# Patient Record
Sex: Male | Born: 1980 | Race: Black or African American | Hispanic: No | Marital: Single | Smoking: Current every day smoker
Health system: Southern US, Community
[De-identification: ages and names within clinical notes are randomized; demographics above are authoritative.]

## PROBLEM LIST (undated history)

## (undated) DIAGNOSIS — F209 Schizophrenia, unspecified: Secondary | ICD-10-CM

---

## 2010-11-26 ENCOUNTER — Inpatient Hospital Stay: Payer: Self-pay | Admitting: Psychiatry

## 2013-09-30 ENCOUNTER — Inpatient Hospital Stay: Payer: Self-pay | Admitting: Psychiatry

## 2013-09-30 LAB — URINALYSIS, COMPLETE
BACTERIA: NONE SEEN
BILIRUBIN, UR: NEGATIVE
Blood: NEGATIVE
Glucose,UR: NEGATIVE mg/dL (ref 0–75)
Ketone: NEGATIVE
LEUKOCYTE ESTERASE: NEGATIVE
NITRITE: NEGATIVE
Ph: 6 (ref 4.5–8.0)
Protein: NEGATIVE
RBC,UR: NONE SEEN /HPF (ref 0–5)
SQUAMOUS EPITHELIAL: NONE SEEN
Specific Gravity: 1.006 (ref 1.003–1.030)
WBC UR: NONE SEEN /HPF (ref 0–5)

## 2013-09-30 LAB — COMPREHENSIVE METABOLIC PANEL
ALBUMIN: 3.4 g/dL (ref 3.4–5.0)
AST: 21 U/L (ref 15–37)
Alkaline Phosphatase: 111 U/L
Anion Gap: 6 — ABNORMAL LOW (ref 7–16)
BUN: 8 mg/dL (ref 7–18)
Bilirubin,Total: 0.3 mg/dL (ref 0.2–1.0)
CREATININE: 0.97 mg/dL (ref 0.60–1.30)
Calcium, Total: 8.6 mg/dL (ref 8.5–10.1)
Chloride: 109 mmol/L — ABNORMAL HIGH (ref 98–107)
Co2: 23 mmol/L (ref 21–32)
EGFR (Non-African Amer.): >60
Glucose: 83 mg/dL (ref 65–99)
Osmolality: 273 (ref 275–301)
Potassium: 3.7 mmol/L (ref 3.5–5.1)
SGPT (ALT): 19 U/L
SODIUM: 138 mmol/L (ref 136–145)
Total Protein: 8 g/dL (ref 6.4–8.2)

## 2013-09-30 LAB — DRUG SCREEN, URINE
AMPHETAMINES, UR SCREEN: NEGATIVE (ref ?–1000)
BENZODIAZEPINE, UR SCRN: NEGATIVE (ref ?–200)
Barbiturates, Ur Screen: NEGATIVE (ref ?–200)
CANNABINOID 50 NG, UR ~~LOC~~: NEGATIVE (ref ?–50)
COCAINE METABOLITE, UR ~~LOC~~: NEGATIVE (ref ?–300)
MDMA (Ecstasy)Ur Screen: NEGATIVE (ref ?–500)
METHADONE, UR SCREEN: NEGATIVE (ref ?–300)
OPIATE, UR SCREEN: NEGATIVE (ref ?–300)
Phencyclidine (PCP) Ur S: NEGATIVE (ref ?–25)
TRICYCLIC, UR SCREEN: NEGATIVE (ref ?–1000)

## 2013-09-30 LAB — ETHANOL: Ethanol: <3 mg/dL

## 2013-09-30 LAB — ACETAMINOPHEN LEVEL: Acetaminophen: <2 ug/mL

## 2013-09-30 LAB — SALICYLATE LEVEL: SALICYLATES, SERUM: 2.8 mg/dL

## 2013-09-30 LAB — CBC
HCT: 42.5 % (ref 40.0–52.0)
HGB: 14 g/dL (ref 13.0–18.0)
MCH: 29.8 pg (ref 26.0–34.0)
MCHC: 33 g/dL (ref 32.0–36.0)
MCV: 90 fL (ref 80–100)
Platelet: 280 10*3/uL (ref 150–440)
RBC: 4.7 10*6/uL (ref 4.40–5.90)
RDW: 13.7 % (ref 11.5–14.5)
WBC: 5.7 10*3/uL (ref 3.8–10.6)

## 2014-05-21 NOTE — H&P (Signed)
PATIENT NAME:  Shane Nash, Shane Nash MR#:  [id number] DATE OF BIRTH:  June 30, 1980  DATE OF ADMISSION:  09/30/2013  REFERRING PHYSICIAN: Emergency Room doctor.   ATTENDING PHYSICIAN: Dr. Kristine Linea.   IDENTIFYING DATA: [name] is a 34 year old male with history of schizophrenia.   CHIEF COMPLAINT: "I want to know how much money I should be getting."   HISTORY OF PRESENT ILLNESS: [name] has a long history of mental illness. He has been stable on a combination of Zyprexa and Luvox. He had very few hospitalizations lately 10 years ago and in 2012 at [facility] His ACT team noticed that the patient behaves strangely. He was not as easy to engage, expressed some bizarre behaviors and lately started complaining of blood coming out of eye. This actuality is one of his main complaints. He was brought to the Emergency Room for evaluation. He was admitted to psychiatric floor. The patient is really unable to describe his symptoms. He denies symptoms of depression. He does not feel too anxious. He does have auditory hallucinations, but lately feels that his right ear is clogged with wax. It helps hallucinations. He is a slightly paranoid but mostly puts forward somatic symptoms of earache and eye ache and bleeding. He says that his eye is better now. It certainly looks normal to me, but he keeps touching it telling me that there used to be a burn. He denies symptoms suggestive of bipolar mania. There is no alcohol or substances involved. Of note, the patient has a history of treatment noncompliance when visiting his big family. Reportedly he spent several days with his family recently. It is quite possible that he stopped taking medications. In addition, in the past he had access to substances, mostly marijuana, when visiting his family. He is negative for substances on urine tox screen. He is unable to explain to me where he lives. He used to live in [facility] home, but  this is not true any longer. He does not know if he is in a group home or if he has an independent apartment. He says you can call it this way. He cannot tell me whether he buys his food or people feed him. He is pretty low functioning. He is concerned that his sister, who is his payee, has been withholding money from him. It all depends on his living situation. We certainly will try to give her a call. [facility] team is well aware of his situation I hope.   PAST PSYCHIATRIC HISTORY: A long history of mental illness, stable on Zyprexa. He was diagnosed in his 67s. He does not have a history of violence or suicide attempt. There is some mild substance use, but not a problem at throughout his life.   FAMILY PSYCHIATRIC HISTORY: None reported.   PAST MEDICAL HISTORY: None.   ALLERGIES: No known drug allergies.   MEDICATIONS ON ADMISSION: Zyprexa 15 mg daily, Luvox 50 mg twice daily   SOCIAL HISTORY: As above, he most likely lives in a group home. He is disabled. He has a payee. He is in the care of Dr. Cherylann Ratel and [facility] ACT team.  REVIEW OF SYSTEMS:  CONSTITUTIONAL: No fevers or chills. No weight changes.  EYES: Positive for some eye burning.  ENT: No hearing loss, but complains of wax in his ear.  RESPIRATORY: No shortness of breath or cough.  CARDIOVASCULAR: No chest pain or orthopnea.  GASTROINTESTINAL: No abdominal pain, nausea, vomiting, or diarrhea.  GENITOURINARY: No incontinence  or frequency.  ENDOCRINE: No heat or cold intolerance.  LYMPHATIC: No anemia or easy bruising.  INTEGUMENTARY: No acne or rash.  MUSCULOSKELETAL: No muscle or joint pain.  NEUROLOGIC: No tingling or weakness.  PSYCHIATRIC: See history of present illness for details.   PHYSICAL EXAMINATION:  VITAL SIGNS: Blood pressure 114/76, pulse 74, respiration 98.9.  GENERAL: This is a well-developed young male in no acute distress.  HEENT: The pupils are equal, round, and reactive to light. Sclerae are anicteric.  NECK:  Supple. No thyromegaly.  LUNGS: Clear to auscultation. No dullness to percussion.  HEART: Regular rhythm and rate. No murmurs, rubs, or gallops.  ABDOMEN: Soft, nontender, nondistended. Positive bowel sounds.  MUSCULOSKELETAL: Normal muscle strength in all extremities.  SKIN: No rashes or bruises.  LYMPHATIC: No cervical adenopathy.  NEUROLOGIC: Cranial nerves II through XII are intact.   LABORATORY DATA: Chemistries are within normal limits. Blood alcohol level is zero. LFTs within normal limits. Urine tox screen negative for substances. CBC within normal limits. Urinalysis is not suggestive of urinary tract infection. Serum acetaminophen and salicylates are low.   MENTAL STATUS EXAMINATION ON ADMISSION: The patient is alert and oriented to person, place, and somewhat to situation. He is pleasant, polite, and cooperative. His is marginally groomed, wearing hospital scrubs. He maintains good eye contact. His speech is slightly slurred, mood is fine with an odd affect. Thought process is slow. He denies thoughts of hurting himself or others. He is slightly paranoid about his money. He endorses hallucinations but is unable to tell me about their content. His cognition is impaired. There is a long latency of response, poverty of thought and speech. He is unable to participate in cognitive part of the examination. He is of below-average intelligence and fund of knowledge. His insight and judgment are poor.   SUICIDE RISK ASSESSMENT ON ADMISSION: This is a patient with a long history of psychosis, admitted for another psychotic break, most likely in the context of medication noncompliance.   INITIAL DIAGNOSES:  AXIS II: Schizophrenia, obsessive-compulsive disorder.   AXIS II: Borderline intellectual functioning.   AXIS III: None.   AXIS IV: Mental illness, treatment compliance.   AXIS V: Global assessment of functioning 35.   PLAN: The patient was admitted to [facility]  Behavioral Medicine Unit for safety, stabilization, and medication management. He was initially placed on suicide precautions and was closely monitored for any unsafe behaviors. He underwent full psychiatric and risk assessment. He received pharmacotherapy, individual and group psychotherapy, substance abuse counseling, and support from therapeutic milieu.  1. Psychosis: We will continue Zyprexa 30 mg at night. 2. Anxiety. We will continue Luvox 50 mg twice daily.   DISPOSITION: He will return to his group home and follow up with the ACT team.    ____________________________ Ellin GoodieJolanta B. Jennet MaduroPucilowska, MD jbp:lt D: 10/01/2013 16:33:00 ET T: 10/01/2013 16:55:45 ET JOB#: [id number]  cc: Charlene Detter B. Jennet MaduroPucilowska, MD, <Dictator> Shari ProwsJOLANTA B Amie Cowens MD ELECTRONICALLY SIGNED 10/25/2013 23:13 Shari ProwsJOLANTA B Cosimo Schertzer MD ELECTRONICALLY SIGNED 10/25/2013 23:22

## 2014-05-21 NOTE — Consult Note (Signed)
Brief Consult Note: Diagnosis: schizophrenia.   Patient was seen by consultant.   Consult note dictated.   Recommend further assessment or treatment.   Orders entered.   Discussed with Attending MD.   Comments: PSychiatry: PAtient seen and chart reviewed. Note dictated. PAtient with schizophrenia has been decompensating at hhome. Admit for stabilization and concern of dangerousness.  Electronic Signatures: Audery Amellapacs, [name] (MD)  (Signed 03-Sep-15 17:09)  Authored: Brief Consult Note   Last Updated: 03-Sep-15 17:09 by Audery Amellapacs, [name] (MD)

## 2014-05-21 NOTE — Consult Note (Signed)
PATIENT NAME:  Shane Nash, Shane L MR#:  [id number] DATE OF BIRTH:  01-Nov-1980  DATE OF CONSULTATION:  09/30/2013  CONSULTING PHYSICIAN:  Audery AmelJohn T. Orva Riles, MD  IDENTIFYING INFORMATION AND REASON FOR CONSULTATION: This is a 34 year old male with a history of schizophrenia, brought by his acting.   CHIEF COMPLAINT: "I'm having some trouble with my eye."   HISTORY OF PRESENT ILLNESS: Information obtained from the patient and the chart. [facility] was also present, giving history. The patient himself says he is coming here because there is something wrong with his right eye. Apparently, he told someone at some point that there was blood coming out of his eye. He made another comment at some point that he needed blood put into his eye. He made some other comments about there  being something wrong with his blood. The ACT team is concerned because they say that for at least several days, the patient has been decompensating from his usual baseline state. He has been more disorganized and his mood has been more withdrawn. He has not been as easy to engage.  His thoughts have seemed to be bizarre. It is also reported that he had damaged some property by cutting up some mattresses back at the group home. There is no recent change to medication. No clear known stress. The patient says he had recently been to visit his family, which in the past was a known stressor for him, but I have not confirm that. No sign that he has been abusing substances that I can see.   PAST PSYCHIATRIC HISTORY: Long history of schizophrenia, usually followed by Dr. Cherylann RatelLateef and the [facility] ACT team. He has been on Zyprexa and is currently on Luvox as well. We also have some paperwork suggesting that he has been given [place]ve[place] Sustenna, although I am not sure if that is meant to replace the Zyprexa or not. No known history of suicidality or violence. It has been years since he had an inpatient hospitalization, the last one  was 3 years ago here at our hospital.  Prior to that, it has been many years since he had had a hospital stay.   FAMILY HISTORY: No known or identified clear family history.   SOCIAL HISTORY: The patient evidently has a very large and involved family, several siblings, who see him and who he visits at times. Lives in a group home. Seems like generally he has thought of as being a very stable, easy to work with patient.   PAST MEDICAL HISTORY: No known medical problems.   SUBSTANCE ABUSE HISTORY: Has a history of use of marijuana, which has caused decompensations, but he denies that he has been using any recently.   CURRENT MEDICATIONS: From the information I have, it looks like it is Zyprexa 15 mg 2 of them at night, so 30 mg at night. Also, Luvox 50 mg twice a day. Possibly an [place] Sust[place] shot as well.   ALLERGIES: No known drug allergies.   REVIEW OF SYSTEMS: The patient says his eye is feeling better now. He was burning earlier, but now it feels fine. He denies any GI, cardiac or pulmonary complaints. He says that his mood is okay. He denies that he is having hallucinations. Denies suicidal or homicidal ideation. Generally negative review of systems all around.   MENTAL STATUS EXAMINATION: Disheveled gentleman, looks poorly groomed. Very passive. Minimal eye contact. Psychomotor activity very limited. Has some odd posturing with his hands; I  am not sure if that is tardive dyskinesia or what. His speech is very quiet and minimal in amount. Affect is flat and without expression, mood stated as okay. Thoughts very simple and slow. Limited elaboration. Concrete. Denies suicidal or homicidal ideation. Denies that he is hearing voices or seeing things. He was alert and oriented to his situation and to being in the hospital. He was able to recall 3/3 objects immediately, 2/3 at 3 minutes. Unclear about how good his baseline fund of knowledge is.   LABORATORY RESULTS: The chemistry panel is  essentially normal, only a very slight abnormality to chloride. The drug screen is all negative. The CBC is normal. Urinalysis is normal.  That is significant because it had been reported that he was urinating in inappropriate places and there was a question as to whether that could be because of an infection, but it does not look like that is the case.   VITAL SIGNS: Blood pressure 139/80, pulse 82, temperature 97.6, respirations 20.   ASSESSMENT: This is a 34 year old man with schizophrenia, who recently has been decompensating with more withdrawn mental state, less communication, more odd behavior, some disorganized behavior, such as inappropriate urination and possible dangerousness from cutting up items at home. No clear stressor. The patient has a history of psychosis that has needed stabilization intermittently. I was driven to make the decision to go ahead and admit him by the history of him cutting things up at home and concern that that could be dangerousness.   TREATMENT PLAN: Admit to psychiatry. Continue the Zyprexa and Luvox. Precautions in place. Primary team downstairs can work on evaluation and further treatment planning.   DIAGNOSIS, PRINCIPAL AND PRIMARY:   AXIS I: Schizophrenia.   SECONDARY DIAGNOSES: AXIS I: No further.  AXIS II: No diagnosis.  AXIS III: No diagnosis.   ____________________________ Audery Amel, MD jtc:lr D: 09/30/2013 17:17:29 ET T: 09/30/2013 18:43:05 ET JOB#: [id number]  cc: Audery Amel, MD, <Dictator> Audery Amel MD ELECTRONICALLY SIGNED 10/08/2013 17:01

## 2017-01-27 ENCOUNTER — Emergency Department
Admission: EM | Admit: 2017-01-27 | Discharge: 2017-01-30 | Disposition: A | Discharge status: Other Acute Inpatient Hospital | Payer: Medicaid Other | Attending: Student in an Organized Health Care Education/Training Program | Admitting: Student in an Organized Health Care Education/Training Program

## 2017-01-27 ENCOUNTER — Encounter: Payer: Self-pay | Admitting: Emergency Medicine

## 2017-01-27 ENCOUNTER — Other Ambulatory Visit: Payer: Self-pay

## 2017-01-27 DIAGNOSIS — F201 Disorganized schizophrenia: Secondary | ICD-10-CM

## 2017-01-27 DIAGNOSIS — Z046 Encounter for general psychiatric examination, requested by authority: Secondary | ICD-10-CM | POA: Diagnosis present

## 2017-01-27 DIAGNOSIS — F17200 Nicotine dependence, unspecified, uncomplicated: Secondary | ICD-10-CM | POA: Insufficient documentation

## 2017-01-27 DIAGNOSIS — F54 Psychological and behavioral factors associated with disorders or diseases classified elsewhere: Secondary | ICD-10-CM | POA: Diagnosis not present

## 2017-01-27 DIAGNOSIS — E871 Hypo-osmolality and hyponatremia: Secondary | ICD-10-CM | POA: Diagnosis not present

## 2017-01-27 DIAGNOSIS — R631 Polydipsia: Secondary | ICD-10-CM

## 2017-01-27 DIAGNOSIS — F259 Schizoaffective disorder, unspecified: Secondary | ICD-10-CM | POA: Insufficient documentation

## 2017-01-27 DIAGNOSIS — F209 Schizophrenia, unspecified: Secondary | ICD-10-CM

## 2017-01-27 HISTORY — DX: Schizophrenia, unspecified: F20.9

## 2017-01-27 LAB — COMPREHENSIVE METABOLIC PANEL
ALBUMIN: 3.5 g/dL (ref 3.5–5.0)
ALK PHOS: 95 U/L (ref 38–126)
ALT: 19 U/L (ref 17–63)
ALT: 18 U/L (ref 17–63)
ANION GAP: 8 (ref 5–15)
AST: 46 U/L — AB (ref 15–41)
AST: 54 U/L — ABNORMAL HIGH (ref 15–41)
Albumin: 3.6 g/dL (ref 3.5–5.0)
Alkaline Phosphatase: 103 U/L (ref 38–126)
Anion gap: 10 (ref 5–15)
BUN: <5 mg/dL — ABNORMAL LOW (ref 6–20)
CALCIUM: 8.5 mg/dL — AB (ref 8.9–10.3)
CHLORIDE: 90 mmol/L — AB (ref 101–111)
CHLORIDE: 104 mmol/L (ref 101–111)
CO2: 21 mmol/L — AB (ref 22–32)
CO2: 25 mmol/L (ref 22–32)
CREATININE: 0.82 mg/dL (ref 0.61–1.24)
Calcium: 8.6 mg/dL — ABNORMAL LOW (ref 8.9–10.3)
Creatinine, Ser: 0.89 mg/dL (ref 0.61–1.24)
GFR calc Af Amer: >60 mL/min (ref >60)
GFR calc Af Amer: >60 mL/min (ref >60)
GFR calc non Af Amer: >60 mL/min (ref >60)
GLUCOSE: 106 mg/dL — AB (ref 65–99)
Glucose, Bld: 92 mg/dL (ref 65–99)
POTASSIUM: 3.2 mmol/L — AB (ref 3.5–5.1)
Potassium: 3.7 mmol/L (ref 3.5–5.1)
SODIUM: 121 mmol/L — AB (ref 135–145)
SODIUM: 137 mmol/L (ref 135–145)
Total Bilirubin: 1.1 mg/dL (ref 0.3–1.2)
Total Bilirubin: 1 mg/dL (ref 0.3–1.2)
Total Protein: 7.8 g/dL (ref 6.5–8.1)
Total Protein: 7.4 g/dL (ref 6.5–8.1)

## 2017-01-27 LAB — ACETAMINOPHEN LEVEL: Acetaminophen (Tylenol), Serum: <10 ug/mL — ABNORMAL LOW (ref 10–30)

## 2017-01-27 LAB — CBC
HCT: 39.2 % — ABNORMAL LOW (ref 40.0–52.0)
HEMOGLOBIN: 13.4 g/dL (ref 13.0–18.0)
MCH: 29.8 pg (ref 26.0–34.0)
MCHC: 34.1 g/dL (ref 32.0–36.0)
MCV: 87.4 fL (ref 80.0–100.0)
PLATELETS: 369 10*3/uL (ref 150–440)
RBC: 4.49 MIL/uL (ref 4.40–5.90)
RDW: 12.6 % (ref 11.5–14.5)
WBC: 12.4 10*3/uL — AB (ref 3.8–10.6)

## 2017-01-27 LAB — ETHANOL

## 2017-01-27 LAB — SALICYLATE LEVEL

## 2017-01-27 MED ORDER — SODIUM CHLORIDE 0.9 % IV BOLUS (SEPSIS)
1000.0000 mL | Freq: Once | INTRAVENOUS | Status: AC
  Administered 2017-01-27: 1000 mL via INTRAVENOUS

## 2017-01-27 MED ORDER — OLANZAPINE 5 MG PO TABS
15.0000 mg | ORAL_TABLET | Freq: Once | ORAL | Status: AC
  Administered 2017-01-27: 15 mg via ORAL
  Filled 2017-01-27: qty 1

## 2017-01-27 MED ORDER — MIDAZOLAM HCL 5 MG/5ML IJ SOLN
5.0000 mg | Freq: Once | INTRAMUSCULAR | Status: AC
  Administered 2017-01-27: 5 mg via INTRAMUSCULAR

## 2017-01-27 MED ORDER — OLANZAPINE 5 MG PO TABS
15.0000 mg | ORAL_TABLET | Freq: Every day | ORAL | Status: DC
  Administered 2017-01-28 – 2017-01-29 (×2): 15 mg via ORAL
  Filled 2017-01-27 (×2): qty 1

## 2017-01-27 MED ORDER — HALOPERIDOL LACTATE 5 MG/ML IJ SOLN
INTRAMUSCULAR | Status: AC
  Administered 2017-01-27: 10 mg via INTRAMUSCULAR
  Filled 2017-01-27: qty 1

## 2017-01-27 MED ORDER — SODIUM CHLORIDE 1 G PO TABS
1.0000 g | ORAL_TABLET | Freq: Three times a day (TID) | ORAL | Status: DC
  Administered 2017-01-27 – 2017-01-30 (×5): 1 g via ORAL
  Filled 2017-01-27 (×11): qty 1

## 2017-01-27 MED ORDER — HALOPERIDOL LACTATE 5 MG/ML IJ SOLN
10.0000 mg | Freq: Once | INTRAMUSCULAR | Status: AC
  Administered 2017-01-27: 10 mg via INTRAMUSCULAR

## 2017-01-27 MED ORDER — OLANZAPINE 5 MG PO TABS
ORAL_TABLET | ORAL | Status: AC
  Filled 2017-01-27: qty 1

## 2017-01-27 MED ORDER — SODIUM CHLORIDE 1 G PO TABS
1.0000 g | ORAL_TABLET | Freq: Once | ORAL | Status: DC
  Filled 2017-01-27: qty 1

## 2017-01-27 MED ORDER — MIDAZOLAM HCL 5 MG/5ML IJ SOLN
INTRAMUSCULAR | Status: AC
  Administered 2017-01-27: 5 mg via INTRAMUSCULAR
  Filled 2017-01-27: qty 5

## 2017-01-27 NOTE — ED Notes (Signed)
Pt awake but lying in bed at this time. Pt is calm and no longer drinking from sink. Pt continues to be ambulatory to bathroom independently.

## 2017-01-27 NOTE — ED Notes (Signed)
PT continues to refuse medications. RN has mixed medication in with a little water and pt drank water and pills together. Pt remains awake at this time and has started pacing the room again. RN has instructed pt back into bed.

## 2017-01-27 NOTE — BH Assessment (Signed)
This Clinical research associatewriter [facility], however patient is sedated.

## 2017-01-27 NOTE — ED Notes (Signed)
Pt lying in bed at this time sleeping. NAD. Pt hooked to monitor. Door closed since garage door in treatment room is open. Blinds open and lights on for continued observation.

## 2017-01-27 NOTE — ED Notes (Signed)
TTS was unable to complete assessment at this time.  Shane Nash was disoriented and was not able to provide additional information.  He arrived to the ED under IVC with the reports of being off his medication for approximately 3 weeks, no sleep for 1 week, and a prior diagnosis of schizophrenia.

## 2017-01-27 NOTE — ED Notes (Signed)
Pt sitting in sub wait with BPD.

## 2017-01-27 NOTE — ED Notes (Signed)
Pt repeatedly attempting to drink out of sink, pt holding his fingers up and talking to his fingers stating "my family needs some water". Pt is noted to be unable to sit still at this time.

## 2017-01-27 NOTE — ED Notes (Signed)
Pt walking around in room and has pulled IV out of arm.  Site is bleeding at this time. Bed and floor are covered in urine. Pt reports he has to use the restroom. Another pt is in the restroom and pt became flustered and dropped pants and started urinating on the floor. Floor is now covered in urine. Pt has been taken into showers. Cloths changes and pt showered. Sheets changes and EVS called to clean the floors. Pt appears to be in NAD at this time and is back in bed resting. No new IV established.

## 2017-01-27 NOTE — Consult Note (Signed)
[facility] Consult   Reason for Consult: Consult for 36 year old man with schizophrenia brought in by law enforcement with reports of bizarre behavior Referring Physician: Quentin Cornwall Patient Identification: TIMUR NIBERT MRN:  [id number] Principal Diagnosis: Schizophrenia [facility]) Diagnosis:   Patient Active Problem List   Diagnosis Date Noted  . Schizophrenia ([place]) [F20.9] 01/27/2017  . Hyponatremia [E87.1] 01/27/2017  . Polydipsia [R63.1] 01/27/2017    Total Time spent with patient: 1 hour  Subjective:   REACE BRESHEARS is a 36 y.o. male patient admitted with "I took my medicine".  HPI: Patient interviewed chart reviewed.  This is a 36 year old man with a known history of schizophrenia brought to the hospital with commitment papers that report that the patient was acting bizarrely appeared to have urinated on himself was unable to engage in coherent conversation.  I attempted to interview the patient.  Found him in his room in the emergency room with his scrub clothes already in [place].  Patient was pacing back and forth waving his arms around but was not aggressive or threatening.  I tried to engage him in conversation but nothing he said really made any sense.  He did not again appear to be hostile responding to me and appeared to react to questions with something that sounded like it could be an appropriate piece of speech but it was all garbled and it was difficult to understand.  He did appear to be saying that he had taken his medicine although he did not know what the medicine was and it was not clear to me whether he was referring to his outpatient medicine or to the shots that he had been given.  Patient denied that he had been using any drugs.  Social history: Patient has a known history of schizophrenia.  Not really clear at this point where he is staying.  It sounds like that law enforcement found him somewhere out in public.  Last time we interacted with this  gentleman he was living in a group home and had an act team  Medical history: Does not have any known ongoing medical problems besides his schizophrenia.  On presentation today his sodium is 121 with other chemistries abnormal as well.  This seems to clearly be related to polydipsia.  The patient has been attempting to drink water from the sink in large quantities just since being in the emergency room.  Substance abuse history: When we have worked with this patient before it was noted that he had a history of getting into marijuana occasionally when he was unsupervised and that that contributed to some decompensation  Past Psychiatric History: No known past psychiatric history.  One prior admission that we know of here which was a few years ago.  At that time was being managed on oral Zyprexa as his primary medicine.  It was noted that he had a history of intermittent noncompliance.  Particularly I see from the old notes that he has a history of becoming noncompliant when he goes to visit his family.  This makes me think there is a good chance that he had a family visit over Christmas which might of contributed to this decompensation but I do not have any direct proof of that.  No known history of suicide attempts.  No known history of violence  Risk to Self: Is patient at risk for suicide?: No, but patient needs Medical Clearance Risk to Others:   Prior Inpatient Therapy:   Prior Outpatient Therapy:  Past Medical History:  Past Medical History:  Diagnosis Date  . Schizophrenia ([place])    History reviewed. No pertinent surgical history. Family History: History reviewed. No pertinent family history. Family Psychiatric  History: None known Social History:  Social History   Substance and Sexual Activity  Alcohol Use Not on file     Social History   Substance and Sexual Activity  Drug Use No    Social History   Socioeconomic History  . Marital status: Single    Spouse name: None  .  Number of children: None  . Years of education: None  . Highest education level: None  Social Needs  . Financial resource strain: None  . Food insecurity - worry: None  . Food insecurity - inability: None  . Transportation needs - medical: None  . Transportation needs - non-medical: None  Occupational History  . None  Tobacco Use  . Smoking status: Current Every Day Smoker  . Smokeless tobacco: Never Used  Substance and Sexual Activity  . Alcohol use: None  . Drug use: No  . Sexual activity: None  Other Topics Concern  . None  Social History Narrative  . None   Additional Social History:    Allergies:  No Known Allergies  Labs:  Results for orders placed or performed during the hospital encounter of 01/27/17 (from the past 48 hour(s))  Comprehensive metabolic panel     Status: Abnormal   Collection Time: 01/27/17 12:47 PM  Result Value Ref Range   Sodium 121 (L) 135 - 145 mmol/L   Potassium 3.2 (L) 3.5 - 5.1 mmol/L   Chloride 90 (L) 101 - 111 mmol/L   CO2 21 (L) 22 - 32 mmol/L   Glucose, Bld 92 65 - 99 mg/dL   BUN <5 (L) 6 - 20 mg/dL   Creatinine, Ser 0.82 0.61 - 1.24 mg/dL   Calcium 8.6 (L) 8.9 - 10.3 mg/dL   Total Protein 7.8 6.5 - 8.1 g/dL   Albumin 3.6 3.5 - 5.0 g/dL   AST 46 (H) 15 - 41 U/L   ALT 19 17 - 63 U/L   Alkaline Phosphatase 103 38 - 126 U/L   Total Bilirubin 1.1 0.3 - 1.2 mg/dL   GFR calc non Af Amer >60 >60 mL/min   GFR calc Af Amer >60 >60 mL/min    Comment: (NOTE) The eGFR has been calculated using the CKD EPI equation. This calculation has not been validated in all clinical situations. eGFR's persistently <60 mL/min signify possible Chronic Kidney Disease.    Anion gap 10 5 - 15    Comment: Performed at [facility], [address] [place], [place] [address]  Ethanol     Status: None   Collection Time: 01/27/17 12:47 PM  Result Value Ref Range   Alcohol, Ethyl (B) <10 <10 mg/dL    Comment:        LOWEST DETECTABLE LIMIT  FOR SERUM ALCOHOL IS 10 mg/dL FOR MEDICAL PURPOSES ONLY Performed at [facility], [address] [place], [place] [address]   Salicylate level     Status: None   Collection Time: 01/27/17 12:47 PM  Result Value Ref Range   Salicylate Lvl <8.5 2.8 - 30.0 mg/dL    Comment: Performed at [facility], [address] [place], [place] [address]  Acetaminophen level     Status: Abnormal   Collection Time: 01/27/17 12:47 PM  Result Value Ref Range   Acetaminophen (Tylenol), Serum <10 (L)  10 - 30 ug/mL    Comment:        THERAPEUTIC CONCENTRATIONS VARY SIGNIFICANTLY. A RANGE OF 10-30 ug/mL MAY BE AN EFFECTIVE CONCENTRATION FOR MANY PATIENTS. HOWEVER, SOME ARE BEST TREATED AT CONCENTRATIONS OUTSIDE THIS RANGE. ACETAMINOPHEN CONCENTRATIONS >150 ug/mL AT 4 HOURS AFTER INGESTION AND >50 ug/mL AT 12 HOURS AFTER INGESTION ARE OFTEN ASSOCIATED WITH TOXIC REACTIONS. Performed at [facility], [address] [place], [place] [address]   cbc     Status: Abnormal   Collection Time: 01/27/17 12:47 PM  Result Value Ref Range   WBC 12.4 (H) 3.8 - 10.6 K/uL   RBC 4.49 4.40 - 5.90 MIL/uL   Hemoglobin 13.4 13.0 - 18.0 g/dL   HCT 39.2 (L) 40.0 - 52.0 %   MCV 87.4 80.0 - 100.0 fL   MCH 29.8 26.0 - 34.0 pg   MCHC 34.1 32.0 - 36.0 g/dL   RDW 12.6 11.5 - 14.5 %   Platelets 369 150 - 440 K/uL    Comment: Performed at [facility], [address] [place], [place] [address]    Current Facility-Administered Medications  Medication Dose Route Frequency Provider Last Rate Last Dose  . OLANZapine (ZYPREXA) tablet 15 mg  15 mg Oral Once Skylar Priest T, MD      . sodium chloride tablet 1 g  1 g Oral TID WC Merlyn Lot, MD       No current outpatient medications on file.    Musculoskeletal: Strength & Muscle Tone: within normal limits Gait & Station: normal Patient leans: N/A  Psychiatric Specialty Exam: Physical Exam  Nursing note and vitals  reviewed. Constitutional: He appears well-developed and well-nourished.  HENT:  Head: Normocephalic and atraumatic.  Eyes: Conjunctivae are normal. Pupils are equal, round, and reactive to light.  Neck: Normal range of motion.  Cardiovascular: Regular rhythm and normal heart sounds.  Respiratory: Effort normal. No respiratory distress.  GI: Soft.  Musculoskeletal: Normal range of motion.  Neurological: He is alert.  Skin: Skin is warm and dry.  Psychiatric: His affect is labile and inappropriate. His speech is slurred. He is agitated and hyperactive. He is not aggressive and not combative. Cognition and memory are impaired. He expresses impulsivity. He is noncommunicative.    Review of Systems  Unable to perform ROS: Psychiatric disorder  Psychiatric/Behavioral: Negative for depression.    Height 5' 8.5" (1.74 m), weight 190 lb (86.2 kg).Body mass index is 28.47 kg/m.  General Appearance: Disheveled  Eye Contact:  Fair  Speech:  Garbled and Slurred  Volume:  Decreased  Mood:  Euthymic  Affect:  Bizarre halfway laughing distracted sort of affect  Thought Process:  Disorganized and Irrelevant  Orientation:  Negative  Thought Content:  Illogical, Rumination and Tangential  Suicidal Thoughts:  No  Homicidal Thoughts:  No  Memory:  Immediate;   Fair Recent;   Poor Remote;   Fair  Judgement:  Impaired  Insight:  Shallow  Psychomotor Activity:  Increased and Restlessness  Concentration:  Concentration: Poor  Recall:  Poor  Fund of Knowledge:  Fair  Language:  Fair  Akathisia:  No  Handed:  Right  AIMS (if indicated):     Assets:  Financial Resources/Insurance Physical Health Resilience  ADL's:  Impaired  Cognition:  Impaired,  Mild  Sleep:        Treatment Plan Summary: Daily contact with patient to assess and evaluate symptoms and progress in treatment, Medication management and Plan 36 year old gentleman with schizophrenia who  presents very psychotic disorganized  acting bizarrely.  He is agitated but is not violent or threatening and has been cooperative with medicine.  We do not have a drug screen back yet but given his past history the most likely thing is that he had been noncompliant with his medicine.  I am restarting him on Zyprexa 15 mg at night which was his previous dose.  Because of the hyponatremia he will need to be observed in the emergency room at least overnight while being water restricted to make sure his sodium is improving.  We will probably need to have a sitter with him when he goes downstairs if he is continuing to try to excessively water drink.  Medicine was consulted.  Agreed with water restriction and replacing his potassium.  We will reevaluate when he is medically cleared.  Disposition: Recommend psychiatric Inpatient admission when medically cleared. Supportive therapy provided about ongoing stressors.  Alethia Berthold, MD 01/27/2017 2:47 PM

## 2017-01-27 NOTE — ED Triage Notes (Signed)
Pt presents to ED in custody of [employer] under IVC. Per IVC pt was unresponsive and urinating on himself. IVC states that pt was dx as schizophrenic and has been off of his meds for approx 3 weeks and hasn't slept for approx 1 week. Pt is noted to speak in jibberish but is able to answer some questions, pt is also noted to be unable to sit still and constantly moving in triage. Pt denies any drug use, states he has been smoking cigarettes.

## 2017-01-27 NOTE — ED Provider Notes (Signed)
[facility] Emergency Department Provider Note    First MD Initiated Contact with Patient 01/27/17 1322     (approximate)  I have reviewed the triage vital signs and the nursing notes.   HISTORY  Chief Complaint Psychiatric Evaluation  Level  V Caveat:  Acute psychosis  HPI Shane Nash is a 36 y.o. male with a history of schizophrenia reportedly off of his medications for the past 3 weeks presents under IVC after being found unresponsive being on himself.  Patient paranoid appearing uncooperative with exam.  Patient standing in room and will not stop drinking water states "family needs water ."  Unable to provide additional history.  Past Medical History:  Diagnosis Date  . Schizophrenia ([facility])    History reviewed. No pertinent family history. History reviewed. No pertinent surgical history. Patient Active Problem List   Diagnosis Date Noted  . Schizophrenia ([facility]) 01/27/2017  . Hyponatremia 01/27/2017  . Polydipsia 01/27/2017      Prior to Admission medications   Not on File    Allergies Patient has no known allergies.    Social History Social History   Tobacco Use  . Smoking status: Current Every Day Smoker  . Smokeless tobacco: Never Used  Substance Use Topics  . Alcohol use: Not on file  . Drug use: No    Review of Systems Patient denies headaches, rhinorrhea, blurry vision, numbness, shortness of breath, chest pain, edema, cough, abdominal pain, nausea, vomiting, diarrhea, dysuria, fevers, rashes or hallucinations unless otherwise stated above in HPI. ____________________________________________   PHYSICAL EXAM:  VITAL SIGNS: Vitals:   01/27/17 1545  BP: 121/74  Pulse: 70  Resp: 16  SpO2: 97%    Constitutional: Alert but acutely psychotic appearing Eyes: Conjunctivae are normal.  Head: Atraumatic. Nose: No congestion/rhinnorhea. Mouth/Throat: Mucous membranes are moist.   Neck: No stridor. Painless ROM.    Cardiovascular: Normal rate, regular rhythm. Grossly normal heart sounds.  Good peripheral circulation. Respiratory: Normal respiratory effort.  No retractions. Lungs CTAB. Gastrointestinal: Soft and nontender. No distention. No abdominal bruits. No CVA tenderness. Genitourinary:  Musculoskeletal: No lower extremity tenderness nor edema.  No joint effusions. Neurologic:  Normal speech and language. No gross focal neurologic deficits are appreciated. No facial droop Skin:  Skin is warm, dry and intact. No rash noted. Psychiatric: agitated, + hallucinating, will not stay still, drinking constantly  ____________________________________________   LABS (all labs ordered are listed, but only abnormal results are displayed)  Results for orders placed or performed during the hospital encounter of 01/27/17 (from the past 24 hour(s))  Comprehensive metabolic panel     Status: Abnormal   Collection Time: 01/27/17 12:47 PM  Result Value Ref Range   Sodium 121 (L) 135 - 145 mmol/L   Potassium 3.2 (L) 3.5 - 5.1 mmol/L   Chloride 90 (L) 101 - 111 mmol/L   CO2 21 (L) 22 - 32 mmol/L   Glucose, Bld 92 65 - 99 mg/dL   BUN <5 (L) 6 - 20 mg/dL   Creatinine, Ser 1.61 0.61 - 1.24 mg/dL   Calcium 8.6 (L) 8.9 - 10.3 mg/dL   Total Protein 7.8 6.5 - 8.1 g/dL   Albumin 3.6 3.5 - 5.0 g/dL   AST 46 (H) 15 - 41 U/L   ALT 19 17 - 63 U/L   Alkaline Phosphatase 103 38 - 126 U/L   Total Bilirubin 1.1 0.3 - 1.2 mg/dL   GFR calc non Af Amer >60 >60 mL/min  GFR calc Af Amer >60 >60 mL/min   Anion gap 10 5 - 15  Ethanol     Status: None   Collection Time: 01/27/17 12:47 PM  Result Value Ref Range   Alcohol, Ethyl (B) <10 <10 mg/dL  Salicylate level     Status: None   Collection Time: 01/27/17 12:47 PM  Result Value Ref Range   Salicylate Lvl <7.0 2.8 - 30.0 mg/dL  Acetaminophen level     Status: Abnormal   Collection Time: 01/27/17 12:47 PM  Result Value Ref Range   Acetaminophen (Tylenol), Serum <10 (L)  10 - 30 ug/mL  cbc     Status: Abnormal   Collection Time: 01/27/17 12:47 PM  Result Value Ref Range   WBC 12.4 (H) 3.8 - 10.6 K/uL   RBC 4.49 4.40 - 5.90 MIL/uL   Hemoglobin 13.4 13.0 - 18.0 g/dL   HCT 09.839.2 (L) 11.940.0 - 14.752.0 %   MCV 87.4 80.0 - 100.0 fL   MCH 29.8 26.0 - 34.0 pg   MCHC 34.1 32.0 - 36.0 g/dL   RDW 82.912.6 56.211.5 - 13.014.5 %   Platelets 369 150 - 440 K/uL   ____________________________________________  EKG My review and personal interpretation at Time: 15:35   Indication: med eval  Rate: 70  Rhythm: sinus Axis: normal Other: normal intervals, nonspecific st changes ____________________________________________  RADIOLOGY ____________________________________________   PROCEDURES  Procedure(s) performed:  .Critical Care Performed by: Willy Eddyobinson, Ashling Roane, MD Authorized by: Willy Eddyobinson, Lorri Fukuhara, MD   Critical care provider statement:    Critical care time (minutes):  35   Critical care time was exclusive of:  Separately billable procedures and treating other patients   Critical care was necessary to treat or prevent imminent or life-threatening deterioration of the following conditions:  CNS failure or compromise, dehydration and metabolic crisis   Critical care was time spent personally by me on the following activities:  Development of treatment plan with patient or surrogate, discussions with consultants, evaluation of patient's response to treatment, examination of patient, obtaining history from patient or surrogate, ordering and performing treatments and interventions, ordering and review of laboratory studies, ordering and review of radiographic studies, pulse oximetry, re-evaluation of patient's condition and review of old charts       Critical Care performed: yes   ____________________________________________   INITIAL IMPRESSION / ASSESSMENT AND PLAN / ED COURSE  Pertinent labs & imaging results that were available during my care of the patient were reviewed  by me and considered in my medical decision making (see chart for details).  DDX: Psychosis, delirium, medication effect, noncompliance, polysubstance abuse, Si, Hi, depression   Shane Nash is a 36 y.o. who presents to the ED with evidence of acute psychosis as well as evidence of psychogenic polydipsia with acute hyponatremia.  Patient without any seizure-like activity.  Patient required calming antipsychotic medications as we were unable to convince him to stop drinking water from the faucet.  No evidence of trauma.  Blood work is otherwise reassuring.  Patient will be placed on fluid restriction.  He was given IV fluids for hyponatremia.  Patient will require IVC as he is acutely psychotic and will require further evaluation by psychiatry.      ____________________________________________   FINAL CLINICAL IMPRESSION(S) / ED DIAGNOSES  Final diagnoses:  Psychogenic polydipsia  Hyponatremia  Schizoaffective disorder, unspecified type (HCC)      NEW MEDICATIONS STARTED DURING THIS VISIT:  This SmartLink is deprecated. Use AVSMEDLIST instead to display the medication  list for a patient.   Note:  This document was prepared using Dragon voice recognition software and may include unintentional dictation errors.    Willy Eddyobinson, Jacksyn Beeks, MD 01/27/17 1640

## 2017-01-27 NOTE — ED Notes (Signed)
IVC  SEEN  BY  DR  [name]  PENDING  PLACEMENT 

## 2017-01-28 DIAGNOSIS — F201 Disorganized schizophrenia: Secondary | ICD-10-CM | POA: Diagnosis not present

## 2017-01-28 LAB — GLUCOSE, CAPILLARY: GLUCOSE-CAPILLARY: 95 mg/dL (ref 65–99)

## 2017-01-28 NOTE — ED Notes (Signed)
BEHAVIORAL HEALTH ROUNDING Patient sleeping: No. Patient alert and oriented: yes Behavior appropriate: Yes.  ; If no, describe:  Nutrition and fluids offered: yes Toileting and hygiene offered: Yes  Sitter present: q15 minute observations and security  monitoring Law enforcement present: Yes  ODS  

## 2017-01-28 NOTE — ED Notes (Signed)

## 2017-01-28 NOTE — Consult Note (Signed)
[place] Psychiatry Consult   Reason for Consult: Follow-up consult for 37 year old man with schizophrenia who was brought into the emergency room yesterday Referring Physician: McShane Patient Identification: Shane Nash MRN:  [id number] Principal Diagnosis: Schizophrenia [facility]) Diagnosis:   Patient Active Problem List   Diagnosis Date Noted  . Schizophrenia ([place]) [F20.9] 01/27/2017  . Hyponatremia [E87.1] 01/27/2017  . Polydipsia [R63.1] 01/27/2017    Total Time spent with patient: 45 minutes  Subjective:   Shane Nash is a 36 y.o. male patient admitted with "I do not know".  HPI: See note from yesterday.  37 year old man with schizophrenia came to the hospital confused psychotic disorganized yesterday.  Patient also was noted to be hyponatremic from water intoxication.  Water was restricted and today his sodium is back to normal.  He has been started back on antipsychotic based on previous treatment.  Patient is a little more coherent.  Some of his words made sense today although he is still not able to put together a lucid history.  He has not been aggressive or agitated today.  Past Psychiatric History: Long history of psychotic disorder multiple prior hospitalizations.  Risk to Self: Is patient at risk for suicide?: No, but patient needs Medical Clearance Risk to Others:   Prior Inpatient Therapy:   Prior Outpatient Therapy:    Past Medical History:  Past Medical History:  Diagnosis Date  . Schizophrenia ([place])    History reviewed. No pertinent surgical history. Family History: History reviewed. No pertinent family history. Family Psychiatric  History: None known Social History:  Social History   Substance and Sexual Activity  Alcohol Use Not on file     Social History   Substance and Sexual Activity  Drug Use No    Social History   Socioeconomic History  . Marital status: Single    Spouse name: None  . Number of children: None  . Years of  education: None  . Highest education level: None  Social Needs  . Financial resource strain: None  . Food insecurity - worry: None  . Food insecurity - inability: None  . Transportation needs - medical: None  . Transportation needs - non-medical: None  Occupational History  . None  Tobacco Use  . Smoking status: Current Every Day Smoker  . Smokeless tobacco: Never Used  Substance and Sexual Activity  . Alcohol use: None  . Drug use: No  . Sexual activity: None  Other Topics Concern  . None  Social History Narrative  . None   Additional Social History:    Allergies:  No Known Allergies  Labs:  Results for orders placed or performed during the hospital encounter of 01/27/17 (from the past 48 hour(s))  Comprehensive metabolic panel     Status: Abnormal   Collection Time: 01/27/17 12:47 PM  Result Value Ref Range   Sodium 121 (L) 135 - 145 mmol/L   Potassium 3.2 (L) 3.5 - 5.1 mmol/L   Chloride 90 (L) 101 - 111 mmol/L   CO2 21 (L) 22 - 32 mmol/L   Glucose, Bld 92 65 - 99 mg/dL   BUN <5 (L) 6 - 20 mg/dL   Creatinine, Ser 0.82 0.61 - 1.24 mg/dL   Calcium 8.6 (L) 8.9 - 10.3 mg/dL   Total Protein 7.8 6.5 - 8.1 g/dL   Albumin 3.6 3.5 - 5.0 g/dL   AST 46 (H) 15 - 41 U/L   ALT 19 17 - 63 U/L   Alkaline Phosphatase 103 38 -  126 U/L   Total Bilirubin 1.1 0.3 - 1.2 mg/dL   GFR calc non Af Amer >60 >60 mL/min   GFR calc Af Amer >60 >60 mL/min    Comment: (NOTE) The eGFR has been calculated using the CKD EPI equation. This calculation has not been validated in all clinical situations. eGFR's persistently <60 mL/min signify possible Chronic Kidney Disease.    Anion gap 10 5 - 15    Comment: Performed at [facility], [address] [place], [place] [address]  Ethanol     Status: None   Collection Time: 01/27/17 12:47 PM  Result Value Ref Range   Alcohol, Ethyl (B) <10 <10 mg/dL    Comment:        LOWEST DETECTABLE LIMIT FOR SERUM ALCOHOL IS 10 mg/dL FOR MEDICAL  PURPOSES ONLY Performed at [facility], [address] [place], [place] [address]   Salicylate level     Status: None   Collection Time: 01/27/17 12:47 PM  Result Value Ref Range   Salicylate Lvl <2.6 2.8 - 30.0 mg/dL    Comment: Performed at [facility], [address] [place], [place] [address]  Acetaminophen level     Status: Abnormal   Collection Time: 01/27/17 12:47 PM  Result Value Ref Range   Acetaminophen (Tylenol), Serum <10 (L) 10 - 30 ug/mL    Comment:        THERAPEUTIC CONCENTRATIONS VARY SIGNIFICANTLY. A RANGE OF 10-30 ug/mL MAY BE AN EFFECTIVE CONCENTRATION FOR MANY PATIENTS. HOWEVER, SOME ARE BEST TREATED AT CONCENTRATIONS OUTSIDE THIS RANGE. ACETAMINOPHEN CONCENTRATIONS >150 ug/mL AT 4 HOURS AFTER INGESTION AND >50 ug/mL AT 12 HOURS AFTER INGESTION ARE OFTEN ASSOCIATED WITH TOXIC REACTIONS. Performed at [facility], [address] [place], [place] [address]   cbc     Status: Abnormal   Collection Time: 01/27/17 12:47 PM  Result Value Ref Range   WBC 12.4 (H) 3.8 - 10.6 K/uL   RBC 4.49 4.40 - 5.90 MIL/uL   Hemoglobin 13.4 13.0 - 18.0 g/dL   HCT 39.2 (L) 40.0 - 52.0 %   MCV 87.4 80.0 - 100.0 fL   MCH 29.8 26.0 - 34.0 pg   MCHC 34.1 32.0 - 36.0 g/dL   RDW 12.6 11.5 - 14.5 %   Platelets 369 150 - 440 K/uL    Comment: Performed at [facility], [address] [place], [place] [address]  Comprehensive metabolic panel     Status: Abnormal   Collection Time: 01/27/17 10:36 PM  Result Value Ref Range   Sodium 137 135 - 145 mmol/L   Potassium 3.7 3.5 - 5.1 mmol/L   Chloride 104 101 - 111 mmol/L   CO2 25 22 - 32 mmol/L   Glucose, Bld 106 (H) 65 - 99 mg/dL   BUN <5 (L) 6 - 20 mg/dL   Creatinine, Ser 0.89 0.61 - 1.24 mg/dL   Calcium 8.5 (L) 8.9 - 10.3 mg/dL   Total Protein 7.4 6.5 - 8.1 g/dL   Albumin 3.5 3.5 - 5.0 g/dL   AST 54 (H) 15 - 41 U/L   ALT 18 17 - 63 U/L   Alkaline Phosphatase 95 38 - 126 U/L    Total Bilirubin 1.0 0.3 - 1.2 mg/dL   GFR calc non Af Amer >60 >60 mL/min   GFR calc Af Amer >60 >60 mL/min    Comment: (NOTE) The eGFR has been calculated using the CKD EPI equation. This calculation has not been  validated in all clinical situations. eGFR's persistently <60 mL/min signify possible Chronic Kidney Disease.    Anion gap 8 5 - 15    Comment: Performed at [facility], [address] [place], [place] [address]  Glucose, capillary     Status: None   Collection Time: 01/28/17  1:03 AM  Result Value Ref Range   Glucose-Capillary 95 65 - 99 mg/dL    Current Facility-Administered Medications  Medication Dose Route Frequency Provider Last Rate Last Dose  . OLANZapine (ZYPREXA) tablet 15 mg  15 mg Oral QHS Phynix Horton T, MD      . sodium chloride tablet 1 g  1 g Oral TID WC Merlyn Lot, MD   1 g at 01/27/17 2005   No current outpatient medications on file.    Musculoskeletal: Strength & Muscle Tone: within normal limits Gait & Station: normal Patient leans: N/A  Psychiatric Specialty Exam: Physical Exam  Constitutional: He appears well-developed and well-nourished.  HENT:  Head: Normocephalic and atraumatic.  Eyes: Conjunctivae are normal. Pupils are equal, round, and reactive to light.  Neck: Normal range of motion.  Cardiovascular: Normal heart sounds.  Respiratory: Effort normal.  GI: Soft.  Musculoskeletal: Normal range of motion.  Neurological: He is alert.  Skin: Skin is warm and dry.  Psychiatric: His affect is blunt. His speech is delayed. He is slowed. Thought content is paranoid. Cognition and memory are impaired. He expresses impulsivity. He expresses no homicidal and no suicidal ideation.    Review of Systems  Constitutional: Negative.   HENT: Negative.   Eyes: Negative.   Respiratory: Negative.   Cardiovascular: Negative.   Gastrointestinal: Negative.   Musculoskeletal: Negative.   Skin: Negative.   Neurological: Negative.    Psychiatric/Behavioral: Negative.     Blood pressure 135/88, pulse 89, temperature 99 F (37.2 C), temperature source Oral, resp. rate 18, height 5' 8.5" (1.74 m), weight 190 lb (86.2 kg), SpO2 97 %.Body mass index is 28.47 kg/m.  General Appearance: Disheveled  Eye Contact:  Minimal  Speech:  Slow  Volume:  Decreased  Mood:  Euthymic  Affect:  Constricted  Thought Process:  Disorganized  Orientation:  Negative  Thought Content:  Illogical  Suicidal Thoughts:  No  Homicidal Thoughts:  No  Memory:  Immediate;   Fair Recent;   Fair Remote;   Fair  Judgement:  Impaired  Insight:  Shallow  Psychomotor Activity:  Decreased  Concentration:  Concentration: Fair  Recall:  [employer] of Knowledge:  Fair  Language:  Fair  Akathisia:  No  Handed:  Right  AIMS (if indicated):     Assets:  Financial Resources/Insurance Housing Physical Health Resilience  ADL's:  Impaired  Cognition:  Impaired,  Mild  Sleep:        Treatment Plan Summary: Daily contact with patient to assess and evaluate symptoms and progress in treatment, Medication management and Plan 37 year old man with schizophrenia.  Has been started back on medicine.  He now stabilized and cleared for admission.  Continues to be psychotic and require inpatient hospital treatment.  Orders will be completed for admission downstairs.  He will need to have water restriction as much as possible downstairs as well.  No other change to medicine at this time.  Disposition: Recommend psychiatric Inpatient admission when medically cleared. Supportive therapy provided about ongoing stressors.  Alethia Berthold, MD 01/28/2017 2:40 PM

## 2017-01-28 NOTE — BH Assessment (Signed)
Assessment Note  Shane Nash is an 37 y.o. male. Pt presented to the ED under IVC via the BPD. IVC states that pts has not taken his medication for schizophrenia in about 3 weeks.   Initially pts was unresponsive and unable to complete first assessment given by TTS. When assessed today the pts was sitting upright on the side of his bed and actively engaged with this Clinical research associatewriter as questions were being asked. Pts answers mainly consisted of yes or no responses but would elaborate if prompted. Pt stated "I wish I had my own place cause I can live by myself" several times throughout the interview.   Pt was unable to maintain steady eye contact with this writer but did make it a point to make eye contact when he needed to elaborate on an answer. His appearance was disheveled and his movements were restless and ridged. Pt seemed to be wiping something off of his face and swatting something out of the air during the entire interview. When asked if pt was having delusions or seeing or hearing things that others may not se or hear the pt refused to respond.  Pt denied SI and HI.   Diagnosis: Schizophrenia   Past Medical History:  Past Medical History:  Diagnosis Date  . Schizophrenia ([facility])     History reviewed. No pertinent surgical history.  Family History: History reviewed. No pertinent family history.  Social History:  reports that he has been smoking.  he has never used smokeless tobacco. He reports that he does not use drugs. His alcohol history is not on file.  Additional Social History:  Alcohol / Drug Use Pain Medications: See MAR Prescriptions: See MAR Over the Counter: See MAR History of alcohol / drug use?: No history of alcohol / drug abuse  CIWA: CIWA-Ar BP: 135/88 Pulse Rate: 89 COWS:    Allergies: No Known Allergies  Home Medications:  (Not in a hospital admission)  OB/GYN Status:  No LMP for male patient.  General Assessment Data Assessment unable to be completed:  Yes Reason for not completing assessment: Assessment Completed(TTS was able to complete assessment today.) [facility] TTS Assessment: In system Is this a Tele or Face-to-Face Assessment?: Face-to-Face Is this an Initial Assessment or a Re-assessment for this encounter?: Re-Assessment Marital status: Single Maiden name: n/a Is patient pregnant?: No Pregnancy Status: No Living Arrangements: Other relatives(Pt reports living with sister) Can pt return to current living arrangement?: Yes Admission Status: Involuntary Is patient capable of signing voluntary admission?: No Referral Source: Self/Family/Friend Insurance type: Medicaid  Medical Screening Exam [facility]) Medical Exam completed: Yes  Crisis Care Plan Living Arrangements: Other relatives(Pt reports living with sister) Legal Guardian: Other: Name of Psychiatrist: n/a Name of Therapist: n/a  Education Status Is patient currently in school?: No Highest grade of school patient has completed: 9th Name of school: [employer]  Risk to self with the past 6 months Suicidal Ideation: No Has patient been a risk to self within the past 6 months prior to admission? : No Suicidal Intent: No Has patient had any suicidal intent within the past 6 months prior to admission? : No Is patient at risk for suicide?: No Suicidal Plan?: No Has patient had any suicidal plan within the past 6 months prior to admission? : No Access to Means: No What has been your use of drugs/alcohol within the last 12 months?: n/a Previous Attempts/Gestures: No Other Self Harm Risks: none noted Intentional Self  Injurious Behavior: None Family Suicide History: No Recent stressful life event(s): (n/a) Persecutory voices/beliefs?: No Depression: No Substance abuse history and/or treatment for substance abuse?: No Suicide prevention information given to non-admitted patients: Not applicable  Risk to Others within the past  6 months Homicidal Ideation: No Does patient have any lifetime risk of violence toward others beyond the six months prior to admission? : No Thoughts of Harm to Others: No Current Homicidal Intent: No Current Homicidal Plan: No Access to Homicidal Means: No Identified Victim: n/a History of harm to others?: No Assessment of Violence: None Noted Violent Behavior Description: n/a Does patient have access to weapons?: No Criminal Charges Pending?: No Does patient have a court date: No Is patient on probation?: No  Psychosis Hallucinations: None noted Delusions: None noted  Mental Status Report Appearance/Hygiene: Disheveled, In scrubs Eye Contact: Poor Motor Activity: Restlessness, Rigidity, Unsteady Speech: Incoherent, Pressured, Loud Level of Consciousness: Alert, Restless Mood: Other (Comment) Affect: Appropriate to circumstance Anxiety Level: Moderate Thought Processes: Relevant Judgement: Partial Orientation: Unable to assess Obsessive Compulsive Thoughts/Behaviors: None  Cognitive Functioning Concentration: Poor Memory: Recent Intact IQ: Average Insight: Poor Impulse Control: Poor Appetite: Good Weight Loss: 0 Weight Gain: 0 Sleep: Decreased Total Hours of Sleep: 4 Vegetative Symptoms: None  ADLScreening [facility] Assessment Services) Patient's cognitive ability adequate to safely complete daily activities?: Yes Patient able to express need for assistance with ADLs?: Yes Independently performs ADLs?: Yes (appropriate for developmental age)  Prior Inpatient Therapy Prior Inpatient Therapy: No Prior Therapy Dates: n/a Prior Therapy Facilty/Provider(s): n/a Reason for Treatment: n/a  Prior Outpatient Therapy Prior Outpatient Therapy: No Prior Therapy Dates: n/a Prior Therapy Facilty/Provider(s): n/a Reason for Treatment: n/a Does patient have an ACCT team?: No Does patient have Intensive In-House Services?  : No Does patient have [facility] services? :  No Does patient have P4CC services?: No  ADL Screening (condition at time of admission) Patient's cognitive ability adequate to safely complete daily activities?: Yes Is the patient deaf or have difficulty hearing?: No Does the patient have difficulty seeing, even when wearing glasses/contacts?: No Does the patient have difficulty concentrating, remembering, or making decisions?: Yes Patient able to express need for assistance with ADLs?: Yes Does the patient have difficulty dressing or bathing?: (Unable to assess) Independently performs ADLs?: Yes (appropriate for developmental age) Does the patient have difficulty walking or climbing stairs?: No Weakness of Legs: None Weakness of Arms/Hands: None       Abuse/Neglect Assessment (Assessment to be complete while patient is alone) Abuse/Neglect Assessment Can Be Completed: Yes Physical Abuse: Denies Verbal Abuse: Denies Sexual Abuse: Denies Exploitation of patient/patient's resources: Denies Self-Neglect: Denies Values / Beliefs Cultural Requests During Hospitalization: None Spiritual Requests During Hospitalization: None Consults Spiritual Care Consult Needed: No Social Work Consult Needed: No Merchant navy officer (For Healthcare) Does Patient Have a Medical Advance Directive?: No Would patient like information on creating a medical advance directive?: No - Patient declined    Additional Information 1:1 In Past 12 Months?: No CIRT Risk: No Elopement Risk: No Does patient have medical clearance?: Yes  Child/Adolescent Assessment Running Away Risk: Denies Bed-Wetting: Denies Destruction of Property: Denies Cruelty to Animals: Denies Stealing: Denies Rebellious/Defies Authority: Denies Satanic Involvement: Denies Archivist: Denies Problems at [employer]: Denies Gang Involvement: Denies  Disposition:  Disposition Initial Assessment Completed for this Encounter: Yes Disposition of Patient: Pending Review with  psychiatrist  On Site Evaluation by:   Reviewed with Physician:    Phebe Colla, MSW, LCSW-A 01/28/2017 3:47  PM

## 2017-01-28 NOTE — ED Notes (Signed)
Meal tray placed in his room - he has awakened  - opening tray and eating extra drink offered but he declined   NAD assessed  He denies pain  Plan of care discussed including his plan for admission when a bed becomes assigned    Continue to monitor

## 2017-01-28 NOTE — ED Notes (Signed)
Patient observed lying in bed with eyes closed  Even, unlabored respirations observed   NAD pt appears to be sleeping  I will continue to monitor along with every 15 minute visual observations and ongoing security monitoring    

## 2017-01-28 NOTE — ED Notes (Signed)
ED Is the patient under IVC or is there intent for IVC: Yes.   Is the patient medically cleared: Yes.   Is there vacancy in the ED BHU: Yes.   Is the population mix appropriate for patient: Yes.   Is the patient awaiting placement in inpatient or outpatient setting:  inpt admission pending   Has the patient had a psychiatric consult: Yes.   Survey of unit performed for contraband, proper placement and condition of furniture, tampering with fixtures in bathroom, shower, and each patient room: Yes.  ; Findings:  APPEARANCE/BEHAVIOR Calm and cooperative NEURO ASSESSMENT Orientation: oriented to self and place   Denies pain Hallucinations: verbalizes auditory hallucinations are present  Speech: Normal Gait: normal - paces at times  RESPIRATORY ASSESSMENT Even  Unlabored respirations  CARDIOVASCULAR ASSESSMENT Pulses equal   regular rate  Skin warm and dry   GASTROINTESTINAL ASSESSMENT no GI complaint EXTREMITIES Full ROM  PLAN OF CARE Provide calm/safe environment. Vital signs assessed twice daily. ED BHU Assessment once each 12-hour shift. Collaborate with TTS daily or as condition indicates. Assure the ED provider has rounded once each shift. Provide and encourage hygiene. Provide redirection as needed. Assess for escalating behavior; address immediately and inform ED provider.  Assess family dynamic and appropriateness for visitation as needed: Yes.  ; If necessary, describe findings:  Educate the patient/family about BHU procedures/visitation: Yes.  ; If necessary, describe findings:

## 2017-01-28 NOTE — ED Notes (Signed)
Pt woke up from sleep, pt is diaphoretic, denies any pain or discomfort, Dr. Manson PasseyBrown made aware, acknowledged orders to check VS, and check CBG

## 2017-01-28 NOTE — ED Provider Notes (Signed)
-----------------------------------------   8:29 AM on 01/28/2017 -----------------------------------------   Blood pressure (!) 138/92, pulse 100, temperature 97.9 F (36.6 C), temperature source Oral, resp. rate 18, height 5' 8.5" (1.74 m), weight 86.2 kg (190 lb), SpO2 97 %.  The patient had no acute events since last update.  Calm and cooperative at this time.  Disposition is pending Psychiatry/Behavioral Medicine team recommendations.     Jeanmarie PlantMcShane, Eleno Weimar A, MD 01/28/17 [phone]

## 2017-01-28 NOTE — ED Notes (Signed)
Patient is IVC and is pending placement. 

## 2017-01-29 NOTE — ED Provider Notes (Signed)
-----------------------------------------   8:00 AM on 01/29/2017 -----------------------------------------   Blood pressure 130/81, pulse (!) 57, temperature 98.6 F (37 C), temperature source Oral, resp. rate 18, height 5' 8.5" (1.74 m), weight 86.2 kg (190 lb), SpO2 99 %.  The patient had no acute events since last update.  Calm and cooperative at this time.  Disposition is pending Psychiatry/Behavioral Medicine team recommendations.     Rebecka ApleyWebster, Riku Buttery P, MD 01/29/17 [phone]

## 2017-01-29 NOTE — ED Notes (Signed)
Patient resting quietly in room. No noted distress or abnormal behaviors noted. Will continue 15 minute checks and observation by security camera for safety. 

## 2017-01-29 NOTE — ED Notes (Signed)
IVC/Pending Placement 

## 2017-01-29 NOTE — ED Notes (Addendum)
Pt  calm and cooperative.  Pt stated he is living with this sister and her daughter in [place]. "They called the man on me and I got locked up."  Pt appears to be responding to internal stimuli, although denies when questioned.  Denies SI/HI. Difficult to assess, not forwarding much information.  Compliant with medication. Maintained on 15 minute checks and observation by security camera for safety.

## 2017-01-29 NOTE — ED Notes (Signed)
Patient cooperative but guarded with bizarre behavior.  Patient observed by staff in room with pants down in an attempt to pee on the floor.  When confronted by staff patient denied any attempt and ensured staff that he knew the location of the bathroom.

## 2017-01-30 ENCOUNTER — Other Ambulatory Visit: Payer: Self-pay

## 2017-01-30 ENCOUNTER — Inpatient Hospital Stay
Admission: AD | Admit: 2017-01-30 | Discharge: 2017-02-14 | DRG: 885 | Disposition: A | Discharge status: Home / Self Care | Payer: Medicaid Other | Attending: Psychiatry | Admitting: Psychiatry

## 2017-01-30 DIAGNOSIS — F17210 Nicotine dependence, cigarettes, uncomplicated: Secondary | ICD-10-CM | POA: Diagnosis present

## 2017-01-30 DIAGNOSIS — G4700 Insomnia, unspecified: Secondary | ICD-10-CM | POA: Diagnosis not present

## 2017-01-30 DIAGNOSIS — E871 Hypo-osmolality and hyponatremia: Secondary | ICD-10-CM | POA: Diagnosis present

## 2017-01-30 DIAGNOSIS — R030 Elevated blood-pressure reading, without diagnosis of hypertension: Secondary | ICD-10-CM | POA: Diagnosis not present

## 2017-01-30 DIAGNOSIS — F201 Disorganized schizophrenia: Principal | ICD-10-CM | POA: Diagnosis present

## 2017-01-30 DIAGNOSIS — Z9119 Patient's noncompliance with other medical treatment and regimen: Secondary | ICD-10-CM

## 2017-01-30 DIAGNOSIS — R631 Polydipsia: Secondary | ICD-10-CM | POA: Diagnosis present

## 2017-01-30 MED ORDER — HYDROXYZINE HCL 50 MG PO TABS: 50.0000 mg | ORAL_TABLET | Freq: Four times a day (QID) | ORAL | Status: DC | PRN

## 2017-01-30 MED ORDER — SODIUM CHLORIDE 1 G PO TABS
1.0000 g | ORAL_TABLET | Freq: Three times a day (TID) | ORAL | Status: DC
  Administered 2017-01-30 – 2017-02-14 (×39): 1 g via ORAL
  Filled 2017-01-30 (×48): qty 1

## 2017-01-30 MED ORDER — ACETAMINOPHEN 325 MG PO TABS: 650.0000 mg | ORAL_TABLET | Freq: Four times a day (QID) | ORAL | Status: DC | PRN

## 2017-01-30 MED ORDER — OLANZAPINE 5 MG PO TABS
15.0000 mg | ORAL_TABLET | Freq: Every day | ORAL | Status: DC
  Administered 2017-01-30: 21:00:00 15 mg via ORAL
  Filled 2017-01-30: qty 1

## 2017-01-30 MED ORDER — MAGNESIUM HYDROXIDE 400 MG/5ML PO SUSP: 30.0000 mL | Freq: Every day | ORAL | Status: DC | PRN

## 2017-01-30 MED ORDER — ALUM & MAG HYDROXIDE-SIMETH 200-200-20 MG/5ML PO SUSP: 30.0000 mL | ORAL | Status: DC | PRN

## 2017-01-30 NOTE — Tx Team (Signed)
Initial Treatment Plan 01/30/2017 4:50 PM Shane Nash [id number]    PATIENT STRESSORS: Loss of Homeless after his sister put him out  Other: Apparent non compliant with his medications    PATIENT STRENGTHS: Physical Health   PATIENT IDENTIFIED PROBLEMS:                      DISCHARGE CRITERIA:  Ability to meet basic life and health needs Adequate post-discharge living arrangements Improved stabilization in mood, thinking, and/or behavior  PRELIMINARY DISCHARGE PLAN: Placement in alternative living arrangements Return to previous living arrangement  PATIENT/FAMILY INVOLVEMENT: This treatment plan has been presented to and reviewed with the patient, Shane Nash.  The patient has been given the opportunity to ask questions and make suggestions.  Rex KrasJoanne  Martrice Apt, RN 01/30/2017, 4:50 PM

## 2017-01-30 NOTE — ED Notes (Signed)
Patient denies SI/HI.  Patient denies AVH however his behavior is bizarre.  Patient is cooperative yet guarded and suspicious.  No distress noted.  Q15 min checks in place for patient safety.  Patient remains safe on the unit.

## 2017-01-30 NOTE — BH Assessment (Signed)
[facility] by Dr. Toni Amendlapacs.  Attending Physician will be Dr. Johnella MoloneyMcNew.   Patient [facility] Lillette BoxerGwen F.   ER staff is aware of the admission Elder Love([place], ER Sect.; Dr. Mayford KnifeWilliams, ER MD; Lincoln Maxinlivette Patient's Nurse & Ivin BootyJoshua, Patient Access).

## 2017-01-30 NOTE — BH Assessment (Signed)
Referral information for Placement have been faxed to: ? [facility] ([phone])  ? [facility] ([phone])  ? [facility] ([phone])  ? [facility] ([phone])  ? [facility] ([phone] 

## 2017-01-30 NOTE — [facility] Group Notes (Signed)
[facility] Group Notes:  (Nursing/MHT/Case Management/Adjunct)  Date:  01/30/2017  Time:  9:35 PM  Type of Therapy:  Group Therapy  Participation Level:  Did Not Attend  Participation Quality: Summary of Progress/Problems:  Shane Nash 01/30/2017, 9:35 PM

## 2017-01-30 NOTE — Progress Notes (Signed)
Received Tremane from the ED,alert,but disorganized and unable to assist with his admission data. He is preoccupied with his hands and appears to be responding to internal stimuli. He is calm and redirectable.  He is focused on drinking with little understanding related to his fluid restriction. His skin assessment was completed with MHT Cara. He was oriented to his new environment and room. Dinner was ordered and he is waiting in the day room.

## 2017-01-30 NOTE — ED Provider Notes (Signed)
-----------------------------------------   6:43 AM on 01/30/2017 -----------------------------------------   Blood pressure 122/84, pulse 85, temperature 98 F (36.7 C), temperature source Oral, resp. rate 18, height 5' 8.5" (1.74 m), weight 86.2 kg (190 lb), SpO2 100 %.  The patient had no acute events since last update.  Calm and cooperative at this time.  Disposition is pending Psychiatry/Behavioral Medicine team recommendations.    Dionne BucySiadecki, Kenzo Ozment, MD 01/30/17 [id number]

## 2017-01-31 DIAGNOSIS — F201 Disorganized schizophrenia: Principal | ICD-10-CM

## 2017-01-31 LAB — LIPID PANEL
CHOL/HDL RATIO: 4.1 ratio
CHOLESTEROL: 160 mg/dL (ref 0–200)
HDL: 39 mg/dL — AB (ref >40)
LDL Cholesterol: 102 mg/dL — ABNORMAL HIGH (ref 0–99)
TRIGLYCERIDES: 95 mg/dL (ref <150)
VLDL: 19 mg/dL (ref 0–40)

## 2017-01-31 LAB — HEMOGLOBIN A1C
Hgb A1c MFr Bld: 5.2 % (ref 4.8–5.6)
Mean Plasma Glucose: 102.54 mg/dL

## 2017-01-31 LAB — TSH: TSH: 2.274 u[IU]/mL (ref 0.350–4.500)

## 2017-01-31 MED ORDER — HALOPERIDOL 5 MG PO TABS: 5.0000 mg | ORAL_TABLET | Freq: Four times a day (QID) | ORAL | Status: DC | PRN

## 2017-01-31 MED ORDER — LORAZEPAM 1 MG PO TABS: 1.0000 mg | ORAL_TABLET | ORAL | Status: DC | PRN

## 2017-01-31 MED ORDER — BENZTROPINE MESYLATE 1 MG PO TABS
1.0000 mg | ORAL_TABLET | Freq: Two times a day (BID) | ORAL | Status: DC
  Administered 2017-01-31 – 2017-02-14 (×27): 1 mg via ORAL
  Filled 2017-01-31 (×28): qty 1

## 2017-01-31 MED ORDER — HALOPERIDOL 5 MG PO TABS
5.0000 mg | ORAL_TABLET | Freq: Two times a day (BID) | ORAL | Status: DC
  Administered 2017-01-31 – 2017-02-02 (×4): 5 mg via ORAL
  Filled 2017-01-31 (×4): qty 1

## 2017-01-31 MED ORDER — NICOTINE 21 MG/24HR TD PT24
21.0000 mg | MEDICATED_PATCH | Freq: Every day | TRANSDERMAL | Status: DC
  Administered 2017-02-01 – 2017-02-10 (×6): 21 mg via TRANSDERMAL
  Filled 2017-01-31 (×10): qty 1

## 2017-01-31 MED ORDER — TRAZODONE HCL 100 MG PO TABS
100.0000 mg | ORAL_TABLET | Freq: Every evening | ORAL | Status: DC | PRN
  Administered 2017-02-05 – 2017-02-06 (×2): 100 mg via ORAL
  Filled 2017-01-31 (×3): qty 1

## 2017-01-31 NOTE — [facility] Group Notes (Signed)
01/31/2017 1PM  Type of Therapy and Topic:  Group Therapy:  Feelings around Relapse and Recovery  Participation Level:  Active   Description of Group:    Patients in this group will discuss emotions they experience before and after a relapse. They will process how experiencing these feelings, or avoidance of experiencing them, relates to having a relapse. Facilitator will guide patients to explore emotions they have related to recovery. Patients will be encouraged to process which emotions are more powerful. They will be guided to discuss the emotional reaction significant others in their lives may have to patients' relapse or recovery. Patients will be assisted in exploring ways to respond to the emotions of others without this contributing to a relapse.  Therapeutic Goals: 1. Patient will identify two or more emotions that lead to a relapse for them 2. Patient will identify two emotions that result when they relapse 3. Patient will identify two emotions related to recovery 4. Patient will demonstrate ability to communicate their needs through discussion and/or role plays   Summary of Patient Progress: Actively and appropriately engaged in the group. Patient was able to provide support and validation to other group members.Patient practiced active listening when interacting with the facilitator and other group members Patient in still in the process of obtaining treatment goals. Loukas reports "I'm just tying to do my time and do the right thing."    Therapeutic Modalities:   Cognitive Behavioral Therapy Solution-Focused Therapy Assertiveness Training Relapse Prevention Therapy   Johny ShearsCassandra  Kaelah Hayashi, LCSW 01/31/2017 1:51 PM

## 2017-01-31 NOTE — Tx Team (Signed)
Interdisciplinary Treatment and Diagnostic Plan Update  01/31/2017 Time of Session: 1100 [name] MRN: [id number]  Principal Diagnosis: <principal problem not specified>  Secondary Diagnoses: Active Problems:   Disorganized schizophrenia (HCC)   Current Medications:  Current Facility-Administered Medications  Medication Dose Route Frequency Provider Last Rate Last Dose  . acetaminophen (TYLENOL) tablet 650 mg  650 mg Oral Q6H PRN [name], [name], MD      . alum & mag hydroxide-simeth (MAALOX/MYLANTA) 200-200-20 MG/5ML suspension 30 mL  30 mL Oral Q4H PRN [name], [name], MD      . haloperidol (HALDOL) tablet 5 mg  5 mg Oral BID McNew, Ileene Hutchinson, MD      . hydrOXYzine (ATARAX/VISTARIL) tablet 50 mg  50 mg Oral Q6H PRN [name], [name], MD      . magnesium hydroxide (MILK OF MAGNESIA) suspension 30 mL  30 mL Oral Daily PRN [name], [name], MD      . sodium chloride tablet 1 g  1 g Oral TID WC [name], [name], MD   1 g at 01/31/17 4098   PTA Medications: No medications prior to admission.    Patient Stressors: Loss of Homeless after his sister put him out  Other: Apparent non compliant with his medications   Patient Strengths: Physical Health  Treatment Modalities: Medication Management, Group therapy, Case management,  1 to 1 session with clinician, Psychoeducation, Recreational therapy.   Physician Treatment Plan for Primary Diagnosis: <principal problem not specified> Long Term Goal(s):     Short Term Goals:    Medication Management: Evaluate patient's response, side effects, and tolerance of medication regimen.  Therapeutic Interventions: 1 to 1 sessions, Unit Group sessions and Medication administration.  Evaluation of Outcomes: Progressing  Physician Treatment Plan for Secondary Diagnosis: Active Problems:   Disorganized schizophrenia (HCC)  Long Term Goal(s):     Short Term Goals:       Medication Management: Evaluate patient's response, side effects, and  tolerance of medication regimen.  Therapeutic Interventions: 1 to 1 sessions, Unit Group sessions and Medication administration.  Evaluation of Outcomes: Progressing   RN Treatment Plan for Primary Diagnosis: <principal problem not specified> Long Term Goal(s): Knowledge of disease and therapeutic regimen to maintain health will improve  Short Term Goals: Ability to verbalize feelings will improve, Ability to identify and develop effective coping behaviors will improve and Compliance with prescribed medications will improve  Medication Management: RN will administer medications as ordered by provider, will assess and evaluate patient's response and provide education to patient for prescribed medication. RN will report any adverse and/or side effects to prescribing provider.  Therapeutic Interventions: 1 on 1 counseling sessions, Psychoeducation, Medication administration, Evaluate responses to treatment, Monitor vital signs and CBGs as ordered, Perform/monitor CIWA, COWS, AIMS and Fall Risk screenings as ordered, Perform wound care treatments as ordered.  Evaluation of Outcomes: Progressing   LCSW Treatment Plan for Primary Diagnosis: <principal problem not specified> Long Term Goal(s): Safe transition to appropriate next level of care at discharge, Engage patient in therapeutic group addressing interpersonal concerns.  Short Term Goals: Engage patient in aftercare planning with referrals and resources, Increase emotional regulation and Increase skills for wellness and recovery  Therapeutic Interventions: Assess for all discharge needs, 1 to 1 time with Social worker, Explore available resources and support systems, Assess for adequacy in community support network, Educate family and significant other(s) on suicide prevention, Complete Psychosocial Assessment, Interpersonal group therapy.  Evaluation of Outcomes: Progressing   Progress in  Treatment: Attending groups: No. Participating  in groups: No. Taking medication as prescribed: Yes. Toleration medication: Yes. Family/Significant other contact made: No, will contact:  pt's sister. Patient understands diagnosis: Yes. Discussing patient identified problems/goals with staff: Yes. Medical problems stabilized or resolved: Yes. Denies suicidal/homicidal ideation: Yes. Issues/concerns per patient self-inventory: No. Other: None at this time.   New problem(s) identified: No, Describe:  None at this time.   New Short Term/Long Term Goal(s): Pt reported his goal for treatment is, "to find a job and stuff like that."   Discharge Plan or Barriers: At this time, pt is unable to provide much clinical information, so discharge planning will be difficult. CSW will continue to assess for an appropriate discharge plan.   Reason for Continuation of Hospitalization: Delusions  Hallucinations Medication stabilization  Estimated Length of Stay: 5-7 days  Recreational Therapy: Patient Stressors: Family (My uncle brought me here after I got out of jail.) Patient Goal: Patient will attend and participate in Recreation Therapy Group Sessions x5 days   Attendees: Patient: Shane Nash  01/31/2017 11:20 AM  Physician: Dr. Johnella MoloneyMcNew, MD 01/31/2017 11:20 AM  Nursing: Leonia ReaderPhyllis Cobb, RN 01/31/2017 11:20 AM  RN Care Manager: 01/31/2017 11:20 AM  Social Worker: Heidi DachKelsey Craig, LCSW 01/31/2017 11:20 AM  Recreational Therapist: Garret ReddishShay Anaiyah Anglemyer, CTRS-LRT 01/31/2017 11:20 AM  Other: Johny Shearsassandra Jarrett, LCSWA 01/31/2017 11:20 AM  Other:  01/31/2017 11:20 AM  Other: 01/31/2017 11:20 AM    Scribe for Treatment Team: Heidi DachKelsey Craig, LCSW 01/31/2017 11:20 AM

## 2017-01-31 NOTE — H&P (Addendum)
Psychiatric Admission Assessment Adult  Patient Identification: Shane Nash MRN:  [id number] Date of Evaluation:  01/31/2017 Chief Complaint:  schizophrenia Principal Diagnosis: <principal problem not specified> Diagnosis:   Patient Active Problem List   Diagnosis Date Noted  . 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 Schizophrenia (HCC) [F20.9] 01/27/2017  . Hyponatremia [E87.1] 01/27/2017  . Polydipsia [R63.1] 01/27/2017   History of Present Illness: 37 yo male with history of schizophrenia brought to the hospital under IVC that report he was acting bizarrely, urinated on himself and incoherent. Pt was very disorganized in the ED. HE was found to be severely hyponatremic. He has history of polydipsia so was placed on water restriction. His sodium did improve. He appears to have had an admission here back in 2015 and was on Zyprexa. Per chart, he has long history of mental illness. HE has had very few hospitalizations. HE used to follow with ACT team but I have contacted them today and he is no longer a client of theirs. Pt has history of treatment noncompliance especially when visiting family members. It is quite possible that he was with family over the holidays and stopped medications. It is noted that he is low functioning.  On assessment today, pt is not able to give much information. Pt was found by RN last night rubbing soap all over himself. He was very pressured and disorganized. HE was visibly responding to internal stimuli, laughing inappropriately and talking to unseen others. HE slept less than an hour last night. Today, pt is able to state that he is at "[facility] center." HE states that he has been here before. HE is oriented to "January 2019" and president "Trump." He states that he is here because 'I'm trying to get back on track and in 30 days I want to get a job. I want to keep things straight." He denies feeling depressed or sad. He denies feeling  suicidal or homicidal. He states that he did get out of jail recently after spending 90 days for stealing a car. He mentions "Haldol" as a possible medication that he was taking but unable to answer when he last took it. He states that he sees "Dr. Madaline Guthrie" for medications. He no longer sees ACT team. HE has very poor eye contact and appears to be talking to himself under his breath and making odd movements with his hands. He reports using marijuana "sometimes" and "I enjoy smoking cigarettes a lot." HE is slightly more coherent today but still has garbled speech and very confused. After treatment team today, he got very confused when leaving and was continuously opening and closing the door.   Associated Signs/Symptoms: Depression Symptoms: Pt denies (Hypo) Manic Symptoms: Pt denies Anxiety Symptoms: Pt denies Psychotic Symptoms:Appears to be responding to internal stimuli PTSD Symptoms: Unable to assess Total Time spent with patient: 45 minutes  Past Psychiatric History: History of schizophrenia. Followed with ACT team in the past but no longer a client of theirs. Unclear if he is following anyone at this time. Past trial of Zyprexa and Luvox. He was hospitalized at [facility] in 2015.   Is the patient at risk to self? Yes.    Has the patient been a risk to self in the past 6 months? No.  Has the patient been a risk to self within the distant past? No.  Is the patient a risk to others? No.  Has the patient been a risk to others in the past 6 months? No.  Has the patient been a risk to others within the distant past? No.  Alcohol Screening:   Substance Abuse History in the last 12 months:  No. Consequences of Substance Abuse: Negative Previous Psychotropic Medications: Yes  Psychological Evaluations: Yes  Past Medical History:  Past Medical History:  Diagnosis Date  . Schizophrenia (HCC)    History reviewed. No pertinent surgical history. Family History: History reviewed. No pertinent family  history. Family Psychiatric  History: Uknown Tobacco Screening:   Social History: Pt states that he currently stays with his sister, Jasmine December. Unable to obtain further history at this time.  Additional Social History: Marital status: Single Are you sexually active?: No What is your sexual orientation?: Heterosexual  Has your sexual activity been affected by drugs, alcohol, medication, or emotional stress?: Pt unable to provide.  Does patient have children?: No                         Allergies:  No Known Allergies Lab Results:  Results for orders placed or performed during the hospital encounter of 01/30/17 (from the past 48 hour(s))  Hemoglobin A1c     Status: None   Collection Time: 01/31/17  6:35 AM  Result Value Ref Range   Hgb A1c MFr Bld 5.2 4.8 - 5.6 %    Comment: (NOTE) Pre diabetes:          5.7%-6.4% Diabetes:              >6.4% Glycemic control for   <7.0% adults with diabetes    Mean Plasma Glucose 102.54 mg/dL    Comment: Performed at [facility], [address], [place], [place] [address]  Lipid panel     Status: Abnormal   Collection Time: 01/31/17  6:35 AM  Result Value Ref Range   Cholesterol 160 0 - 200 mg/dL   Triglycerides 95 <409 mg/dL   HDL 39 (L) >81 mg/dL   Total CHOL/HDL Ratio 4.1 RATIO   VLDL 19 0 - 40 mg/dL   LDL Cholesterol 191 (H) 0 - 99 mg/dL    Comment:        Total Cholesterol/HDL:CHD Risk Coronary Heart Disease Risk Table                     Men   Women  1/2 Average Risk   3.4   3.3  Average Risk       5.0   4.4  2 X Average Risk   9.6   7.1  3 X Average Risk  23.4   11.0        Use the calculated Patient Ratio above and the CHD Risk Table to determine the patient's CHD Risk.        ATP III CLASSIFICATION (LDL):  <100     mg/dL   Optimal  478-295  mg/dL   Near or Above                    Optimal  130-159  mg/dL   Borderline  621-308  mg/dL   High  >657     mg/dL   Very High Performed at [facility],  [address] [place], [place] [address]   TSH     Status: None   Collection Time: 01/31/17  6:35 AM  Result Value Ref Range   TSH 2.274 0.350 - 4.500 uIU/mL    Comment: Performed by a 3rd Generation assay  with a functional sensitivity of <=0.01 uIU/mL. Performed at [facility], [address] [place], [place] [address]     Blood Alcohol level:  Lab Results  Component Value Date   [facility] <10 01/27/2017    Metabolic Disorder Labs:  Lab Results  Component Value Date   HGBA1C 5.2 01/31/2017   MPG 102.54 01/31/2017   No results found for: PROLACTIN Lab Results  Component Value Date   CHOL 160 01/31/2017   TRIG 95 01/31/2017   HDL 39 (L) 01/31/2017   CHOLHDL 4.1 01/31/2017   VLDL 19 01/31/2017   LDLCALC 102 (H) 01/31/2017    Current Medications: Current Facility-Administered Medications  Medication Dose Route Frequency Provider Last Rate Last Dose  . acetaminophen (TYLENOL) tablet 650 mg  650 mg Oral Q6H PRN [name], [name], MD      . alum & mag hydroxide-simeth (MAALOX/MYLANTA) 200-200-20 MG/5ML suspension 30 mL  30 mL Oral Q4H PRN [name], [name], MD      . haloperidol (HALDOL) tablet 5 mg  5 mg Oral BID Alferd Obryant, Ileene Hutchinson, MD      . hydrOXYzine (ATARAX/VISTARIL) tablet 50 mg  50 mg Oral Q6H PRN [name], [name], MD      . magnesium hydroxide (MILK OF MAGNESIA) suspension 30 mL  30 mL Oral Daily PRN [name], [name], MD      . nicotine (NICODERM CQ - dosed in mg/24 hours) patch 21 mg  21 mg Transdermal Daily Yaslin Kirtley R, MD      . sodium chloride tablet 1 g  1 g Oral TID WC [name], [name], MD   1 g at 01/31/17 1300   PTA Medications: No medications prior to admission.    Musculoskeletal: Strength & Muscle Tone: within normal limits Gait & Station: normal Patient leans: N/A  Psychiatric Specialty Exam: Physical Exam  Nursing note and vitals reviewed.   Review of Systems  All other systems reviewed and are negative.   Blood pressure 133/82, pulse 68,  resp. rate 18, height 5' 9.49" (1.765 m), weight 76.2 kg (168 lb), SpO2 100 %.Body mass index is 24.46 kg/m.  General Appearance: Disheveled  Eye Contact:  Poor  Speech:  Garbled  Volume:  Decreased  Mood:  Unable to assess  Affect:  Inappropriate  Thought Process:  Disorganized  Orientation:  Full (Time, Place, and Person)  Thought Content:  Hallucinations: Auditory  Suicidal Thoughts:  No  Homicidal Thoughts:  No  Memory:  NA  Judgement:  Impaired  Insight:  Lacking  Psychomotor Activity:  Increased  Concentration:  Concentration: Poor  Recall:  Poor  Fund of Knowledge:  Poor  Language:  Poor  Akathisia:  No      Assets:  Resilience  ADL's:  Impaired  Cognition:  WNL  Sleep:  Number of Hours: 0.75    Treatment Plan Summary: 37 yo male admitted due to psychosis. HE continues to be very disorganized but possibly slightly improved from yesterday. HE mentions that he takes "Haldol" but is unable to give much more history than that. He states that he lives with his sister but it appears the number in chart is not a working number. We do not have much more information at this time. I contacted ACT team and they stated he is no longer a client of theirs.   Plan:  Schizophrenia -Will switch Zyprexa to Haldol 5 mg BID as he mentioned this as a potential medication he was taking. He can then transition  to LAI due to history of noncompliance -Start Cogentin 1 mg BID for EPS -Ativan prn for agiation -Pt is on water restriction due to polydipsia -Will recheck CMP tomorrow to monitor sodium -QTc 414  Dispo -Unclear. Will continue to try to get collateral from family  Observation Level/Precautions:  15 minute checks  Laboratory:  Chemistry Profile  Psychotherapy:    Medications:    Consultations:    Discharge Concerns:    Estimated LOS: 5-7 days  Other:     Physician Treatment Plan for Primary Diagnosis: Disorganized schizophrenia (HCC) Long Term Goal(s): Improvement in  symptoms so as ready for discharge  Short Term Goals: Ability to identify changes in lifestyle to reduce recurrence of condition will improve and Ability to demonstrate self-control will improve  I certify that inpatient services furnished can reasonably be expected to improve the patient's condition.    Haskell RilingHolly R Giuliana Handyside, MD 1/4/20192:30 PM

## 2017-01-31 NOTE — Progress Notes (Addendum)
Recreation Therapy Notes  Date: 01.04.2019  Time:  9:30 am  Location: Craft Room  Behavioral response: N/A  Intervention Topic: Communication  Discussion/Intervention: Patient did not attend group.  Clinical Observations/Feedback:  Patient did not attend group.  Reniya Mcclees LRT/CTRS           Sargent Mankey 01/31/2017 12:37 PM 

## 2017-01-31 NOTE — Progress Notes (Addendum)
Patient found in room rubbing soap all over himself upon my arrival. States, "I'm waiting to go home. They're coming to get me." Speech is pressured, rapid, and slurred. Patient is unable to sit still. Thought process is very disorganized. Patient visibly responding to internal stimuli. Laughing inappropriately and talking and looking at someone not there. Denies AVH when questioned. Denies SI, HI, depression, and anxiety. EKG ordered for this patient. Very difficult to obtain due to patient's posturing, constant movement, laughing, and talking. EKG eventually obtained after several attempts. Copy placed in chart. Patient is unable to lie his head down even after Olanzapine administration. Patient is cooperative and compliant with staff direction, he is simply unable to sit still. When completing a task, patient is very disorganized and must be prompted multiple times. Patient was able to tolerate the day room for only a few moments during snack time. Patient remained on the periphery in the hall for most of the evening with his belongings in his hand as he believed he was waiting to be picked up. Denies pain. Appetite observed to be good. Patient requests multiple snacks. Patient is currently in bed, awake @0120 . Q 15 minute checks maintained. Will continue to monitor throughout the shift.  Patient is awake through the night responding to internal stimuli. Patient remained in bed but was observed talking to himself incessantly.

## 2017-01-31 NOTE — [facility] Counselor (Signed)
Adult Comprehensive Assessment  Patient ID: Shane Nash, male   DOB: 13-Apr-1980, 37 y.o.   MRN: [id number]  Information Source: Information source: Patient  Current Stressors:  Educational / Learning stressors: None reported.  Employment / Job issues: None reported.  Family Relationships: None reported.  Financial / Lack of resources (include bankruptcy): None reported.  Housing / Lack of housing: None reported.  Physical health (include injuries & life threatening diseases): None reported.  Social relationships: None reported.  Substance abuse: None reported.  Bereavement / Loss: None reported.   Living/Environment/Situation:  Living Arrangements: Other relatives Living conditions (as described by patient or guardian): Pt reported living with his sister.  How long has patient lived in current situation?: Pt unable to provide.  What is atmosphere in current home: Comfortable  Family History:  Marital status: Single Are you sexually active?: No What is your sexual orientation?: Heterosexual  Has your sexual activity been affected by drugs, alcohol, medication, or emotional stress?: Pt unable to provide.  Does patient have children?: No  Childhood History:  By whom was/is the patient raised?: Both parents Additional childhood history information: Pt was not able to provide further information.  Description of patient's relationship with caregiver when they were a child: "It was good I guess."  Patient's description of current relationship with people who raised him/her: "Everything is good."  How were you disciplined when you got in trouble as a child/adolescent?: Pt declined to answer.  Does patient have siblings?: Yes Number of Siblings: (Pt was unable to provide further information. ) Description of patient's current relationship with siblings: "Good."  Did patient suffer any verbal/emotional/physical/sexual abuse as a child?: No Did patient suffer from severe childhood  neglect?: No Has patient ever been sexually abused/assaulted/raped as an adolescent or adult?: No Was the patient ever a victim of a crime or a disaster?: No Witnessed domestic violence?: No Has patient been effected by domestic violence as an adult?: No  Education:  Highest grade of school patient has completed: 9th grade Currently a student?: No Name of school: N/A Learning disability?: No  Employment/Work Situation:   Employment situation: On disability Why is patient on disability: Mental health  How long has patient been on disability: unable to provide  Patient's job has been impacted by current illness: Yes Describe how patient's job has been impacted: Pt is unable to work due to his mental health.  What is the longest time patient has a held a job?: Pt was unable to provide information.  Where was the patient employed at that time?: Pt was unable to provide information.  Has patient ever been in the [employer]itary?: No Has patient ever served in combat?: No Did You Receive Any Psychiatric Treatment/Services While in the [employer]?: (N/A) Are There Guns or Other Weapons in Your Home?: No Are These Weapons Safely Secured?: (n/A)  Financial Resources:   Financial resources: Receives SSDI Does patient have a Lawyerrepresentative payee or guardian?: No  Alcohol/Substance Abuse:   What has been your use of drugs/alcohol within the last 12 months?: Pt stated, "I drink a beer every now and then. Sometimes I smoke pot."  If attempted suicide, did drugs/alcohol play a role in this?: No Alcohol/Substance Abuse Treatment Hx: Denies past history Has alcohol/substance abuse ever caused legal problems?: No  Social Support System:   Patient's Community Support System: Fair Describe Community Support System: "I don't really go into the community."  Pt reported living with his sister, however.  Type of faith/religion: "I can't  talk to you about that."  How does patient's faith help to cope with  current illness?: N/A  Leisure/Recreation:   Leisure and Hobbies: "I like to play basketball."   Strengths/Needs:   What things does the patient do well?: Pt has family support In what areas does patient struggle / problems for patient: medication adherence   Discharge Plan:   Does patient have access to transportation?: No Plan for no access to transportation at discharge: Pt was unable to provide information regarding his transportation.  Will patient be returning to same living situation after discharge?: Yes Currently receiving community mental health services: No If no, would patient like referral for services when discharged?: Yes (What county?)([facility] ) Does patient have financial barriers related to discharge medications?: No  Summary/Recommendations:   Summary and Recommendations (to be completed by the evaluator): Pt is a 37 year old male who presents to [facility] on an IVC. Pt was unable to provide a lot of information regarding his admission. Pt reported, "I was at my sister's house and her daughter called the police." Pt was unable to provide further information. Pt's speech was pressured and disorganized during assessment. Pt denies HI, SI, or hallucinations. 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 Current recommendations for this patient include crisis stabilization, therapeutic milieu, encouragement to attend and participate in group therapy, and the development of a comprehensive mental wellness plan.   Heidi Dach, LCSW. 01/31/2017

## 2017-01-31 NOTE — [facility] Suicide Risk Assessment (Addendum)
[facility] INPATIENT:  Family/Significant Other Suicide Prevention Education  Suicide Prevention Education:  Contact Attempts: Pt's sister, [name] at [phone],  has been identified by the patient as the family member/significant other with whom the patient will be residing, and identified as the person(s) who will aid the patient in the event of a mental health crisis.  With written consent from the patient, two attempts were made to provide suicide prevention education, prior to and/or following the patient's discharge.  We were unsuccessful in providing suicide prevention education.  A suicide education pamphlet was given to the patient to share with family/significant other.  Date and time of first attempt: 01/31/17 at 11:30AM Date and time of second attempt: 02/03/17 at 8:55AM  Heidi DachKelsey Jilian West, LCSW 02/03/2017, 8:56 AM

## 2017-01-31 NOTE — Plan of Care (Signed)
  Not Progressing Activity: Will identify at least one activity in which they can participate 01/31/2017 0103 - Not Progressing by Galen ManilaVigil, Alecia Doi E, RN Coping: Ability to identify and develop effective coping behavior will improve 01/31/2017 0103 - Not Progressing by Galen ManilaVigil, Muad Noga E, RN Ability to interact with others will improve 01/31/2017 0103 - Not Progressing by Galen ManilaVigil, Shaniah Baltes E, RN Participation in decision-making will improve 01/31/2017 0103 - Not Progressing by Galen ManilaVigil, Nahima Ales E, RN Ability to use eye contact when communicating with others will improve 01/31/2017 0103 - Not Progressing by Galen ManilaVigil, Tallin Hart E, RN Health Behavior/Discharge Planning: Identification of resources available to assist in meeting health care needs will improve 01/31/2017 0103 - Not Progressing by Galen ManilaVigil, Curstin Schmale E, RN Self-Concept: Ability to verbalize positive feelings about self will improve 01/31/2017 0103 - Not Progressing by Galen ManilaVigil, Lam Mccubbins E, RN Coping: Level of anxiety will decrease 01/31/2017 0103 - Not Progressing by Galen ManilaVigil, Latania Bascomb E, RN Pain Managment: General experience of comfort will improve 01/31/2017 0103 - Not Progressing by Galen ManilaVigil, Turkessa Ostrom E, RN Safety: Ability to remain free from injury will improve 01/31/2017 0103 - Not Progressing by Galen ManilaVigil, Ellena Kamen E, RN Skin Integrity: Risk for impaired skin integrity will decrease 01/31/2017 0103 - Not Progressing by Galen ManilaVigil, Khalani Novoa E, RN

## 2017-01-31 NOTE — Plan of Care (Signed)
Received [place] this am after breakfast, he was compliant with his medications. He is visible in the milieu at intervals. He could only describe his mood as feeling normal. He did not respond to the direct psychiatric questions after several attempts. He is alert and oriented x4. His concentration is impaired and or preoccupied with frequent redirections, such as arriving for medication administration.

## 2017-01-31 NOTE — Progress Notes (Signed)
Recreation Therapy Notes  INPATIENT RECREATION THERAPY ASSESSMENT  Patient Details Name: Shane Nash MRN: [id number] DOB: 02-03-1980 Today's Date: 01/31/2017  Patient Stressors: Family(My uncle brought me here after I got out of jail.)  Coping Skills:   Music, Other (Comment)(Smoking)  Personal Challenges: Decision-Making, Relationships, Social Interaction  Leisure Interests (2+):  Social - Friends  LawyerAwareness of Community Resources:  No  Community Resources:     Current Use:    If no, Barriers?:    Patient Strengths:  N/A  Patient Identified Areas of Improvement:  N/A  Current Recreation Participation:  N/A  Patient Goal for Hospitalization:  To get out and get a job and find something to drive  [place]   Current SI (including self-harm):  No  Current HI:  No  Consent to Intern Participation: N/A   Shane Nash 01/31/2017, 3:03 PM

## 2017-01-31 NOTE — [facility] Group Notes (Signed)
[facility] Group Notes:  (Nursing/MHT/Case Management/Adjunct)  Date:  01/31/2017  Time:  9:18 PM  Type of Therapy:  Group Therapy  Participation Level:  Did Not Attend

## 2017-02-01 LAB — URINE DRUG SCREEN, QUALITATIVE (ARMC ONLY)
AMPHETAMINES, UR SCREEN: NOT DETECTED
BENZODIAZEPINE, UR SCRN: NOT DETECTED
Barbiturates, Ur Screen: NOT DETECTED
Cannabinoid 50 Ng, Ur ~~LOC~~: NOT DETECTED
Cocaine Metabolite,Ur ~~LOC~~: NOT DETECTED
MDMA (Ecstasy)Ur Screen: NOT DETECTED
Methadone Scn, Ur: NOT DETECTED
Opiate, Ur Screen: NOT DETECTED
PHENCYCLIDINE (PCP) UR S: NOT DETECTED
Tricyclic, Ur Screen: NOT DETECTED

## 2017-02-01 NOTE — Plan of Care (Signed)
Patient is alert to self and unit. Patient remains preoccupied responding to internal stimuli. Patient denies SI, HI and AVH. Patient is very restless will stand beside or pace back and forth in the hallways, but will not attend groups. Patient has no complaints. Patient was able to voice this morning he needed his nicotine patch and then very quickly stated nevermind I am going to my room. RN placed nicotine patch on his left arm by request in patient's room. Patient is compliant with medications. RN will continue to monitor. Safety checks will continue Q 15 minutes. Activity: Will identify at least one activity in which they can participate 02/01/2017 1208 - Not Progressing by Leamon ArntPowell, Katelyne Galster K, RN   Coping: Ability to identify and develop effective coping behavior will improve 02/01/2017 1208 - Not Progressing by Leamon ArntPowell, Erie Sica K, RN Ability to interact with others will improve 02/01/2017 1208 - Progressing by Leamon ArntPowell, Kessie Croston K, RN Participation in decision-making will improve 02/01/2017 1208 - Not Progressing by Leamon ArntPowell, Tashyra Adduci K, RN Ability to use eye contact when communicating with others will improve 02/01/2017 1208 - Progressing by Leamon ArntPowell, Letoya Stallone K, RN

## 2017-02-01 NOTE — Plan of Care (Signed)
Pt. Able to remain safe while on the unit. Pt verbalizes he can remain safe on unit and denies SI/HI and verbally contracts. Pt interactive is more engaging and is able to periodically have improved eye contact. Pt able to answer alert and orientation questions with accurate answers, establishing Alert and Oriented X 4.

## 2017-02-01 NOTE — Progress Notes (Signed)
D:Pt denies SI/HI/AVH. Pt is: preoccupied, appearing to be responding to internal stimuli,  guarded, avertive in eye contact, and appears apathetic. Pt minimally cooperative in engaging with this Clinical research associatewriter.   A: Q x 15 minute observation checks were completed for safety. Patient was provided with education, but needs reinforcement. Patient was given scheduled medications. Patient  was encourage to attend groups, participate in unit activities and continue with plan of care.   R:Patient is complaint with medication and unit procedures, but did not attend groups. Pt isolative and withdrawn, but at times will wonder. Pt not able to complete UA sample do to current condition. Will continue to encourage compliance with sampling.             Patient slept for Estimated Hours of 6.30; Precautionary checks every 15 minutes for safety maintained, room free of safety hazards, patient sustains no injury or falls during this shift.

## 2017-02-01 NOTE — Plan of Care (Signed)
Pt able to remain safe while on the unit. Pt able to verbalize contract for safety. Pt. Denies SI/HI to this Clinical research associatewriter.

## 2017-02-01 NOTE — [facility] Group Notes (Signed)
LCSW Group Therapy Note   02/01/2017 1:15pm   Type of Therapy and Topic:  Group Therapy:  Trust and Honesty  Participation Level:  Did Not Attend  Description of Group:    In this group patients will be asked to explore the value of being honest.  Patients will be guided to discuss their thoughts, feelings, and behaviors related to honesty and trusting in others. Patients will process together how trust and honesty relate to forming relationships with peers, family members, and self. Each patient will be challenged to identify and express feelings of being vulnerable. Patients will discuss reasons why people are dishonest and identify alternative outcomes if one was truthful (to self or others). This group will be process-oriented, with patients participating in exploration of their own experiences, giving and receiving support, and processing challenge from other group members.   Therapeutic Goals: 1. Patient will identify why honesty is important to relationships and how honesty overall affects relationships.  2. Patient will identify a situation where they lied or were lied too and the  feelings, thought process, and behaviors surrounding the situation 3. Patient will identify the meaning of being vulnerable, how that feels, and how that correlates to being honest with self and others. 4. Patient will identify situations where they could have told the truth, but instead lied and explain reasons of dishonesty.   Summary of Patient Progress    Therapeutic Modalities:   Cognitive Behavioral Therapy Solution Focused Therapy Motivational Interviewing Brief Therapy  Gaytha Raybourn  CUEBAS-COLON, LCSW 02/01/2017 9:35 AM  

## 2017-02-01 NOTE — Progress Notes (Signed)
[facility]  02/01/2017 8:02 PM Shane Nash  MRN:  [id number]    Subjective:   The patient has been somewhat restless and pacing in the hallways. He does still appear to be responding to internal stimuli  He denies any auditory or visual he denies any suicidal thoughts. Speech is somewhat slurred and thought processes are disorganized. He was calm and cooperative and has been compliant with Haldol. In physical adverse side effects associated with the medication. He did not attend any groups today. He says he slept fairly well last night. Vital signs able. No somatic complaints. No notes from nursing reported excessive water intake.   Past Psychiatric History: History of schizophrenia. Followed with ACT team in the past but no longer a client of theirs. Unclear if he is following anyone at this time. Past trial of Zyprexa and Luvox. He was [facility].   Social History: Pt states that he currently stays with his sister, [name]. Unable to obtain further history at this time.   Family Psychiatric History: He denies any history of mental illness in family   Principal Problem: Disorganized schizophrenia (HCC) Diagnosis:   Patient Active Problem List   Diagnosis Date Noted  . 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 Schizophrenia (HCC) [F20.9] 01/27/2017  . Hyponatremia [E87.1] 01/27/2017  . Polydipsia [R63.1] 01/27/2017   Total Time spent with patient: 20 minutes    Past Medical History:  Past Medical History:  Diagnosis Date  . Schizophrenia (HCC)    History reviewed. No pertinent surgical history.  Social History:  Social History   Substance and Sexual Activity  Alcohol Use Not on file     Social History   Substance and Sexual Activity  Drug Use No    Social History   Socioeconomic History  . Marital status: Single    Spouse name: None  . Number of children: None  . Years of education: None  . Highest education level:  None  Social Needs  . Financial resource strain: None  . Food insecurity - worry: None  . Food insecurity - inability: None  . Transportation needs - medical: None  . Transportation needs - non-medical: None  Occupational History  . None  Tobacco Use  . Smoking status: Current Every Day Smoker    Packs/day: 0.25    Years: 15.00    Pack years: 3.75    Types: Cigarettes  . Smokeless tobacco: Never Used  . Tobacco comment: Cannot state the amount he smokes per day  Substance and Sexual Activity  . Alcohol use: None  . Drug use: No  . Sexual activity: Not Currently  Other Topics Concern  . None  Social History Narrative  . None   Additional Social History:                         Sleep: Fair  Appetite:  Fair  Current Medications: Current Facility-Administered Medications  Medication Dose Route Frequency Provider Last Rate Last Dose  . acetaminophen (TYLENOL) tablet 650 mg  650 mg Oral Q6H PRN [name], [name], MD      . alum & mag hydroxide-simeth (MAALOX/MYLANTA) 200-200-20 MG/5ML suspension 30 mL  30 mL Oral Q4H PRN [name], [name], MD      . benztropine (COGENTIN) tablet 1 mg  1 mg Oral BID McNew, Ileene HutchinsonHolly R, MD   1 mg at 02/01/17 0835  . haloperidol (HALDOL) tablet 5 mg  5  mg Oral BID Haskell Riling, MD   5 mg at 02/01/17 2952  . haloperidol (HALDOL) tablet 5 mg  5 mg Oral Q6H PRN McNew, Ileene Hutchinson, MD      . LORazepam (ATIVAN) tablet 1 mg  1 mg Oral Q4H PRN McNew, Ileene Hutchinson, MD      . magnesium hydroxide (MILK OF MAGNESIA) suspension 30 mL  30 mL Oral Daily PRN [name], [name], MD      . nicotine (NICODERM CQ - dosed in mg/24 hours) patch 21 mg  21 mg Transdermal Daily McNew, Ileene Hutchinson, MD   21 mg at 02/01/17 1112  . sodium chloride tablet 1 g  1 g Oral TID WC [name], [name], MD   1 g at 02/01/17 1637  . traZODone (DESYREL) tablet 100 mg  100 mg Oral QHS PRN McNew, Ileene Hutchinson, MD        Lab Results:  Results for orders placed or performed during the hospital encounter  of 01/30/17 (from the past 48 hour(s))  Hemoglobin A1c     Status: None   Collection Time: 01/31/17  6:35 AM  Result Value Ref Range   Hgb A1c MFr Bld 5.2 4.8 - 5.6 %    Comment: (NOTE) Pre diabetes:          5.7%-6.4% Diabetes:              >6.4% Glycemic control for   <7.0% adults with diabetes    Mean Plasma Glucose 102.54 mg/dL    Comment: Performed at [facility], [address] [place], [place] [address]  Lipid panel     Status: Abnormal   Collection Time: 01/31/17  6:35 AM  Result Value Ref Range   Cholesterol 160 0 - 200 mg/dL   Triglycerides 95 <440 mg/dL   HDL 39 (L) >10 mg/dL   Total CHOL/HDL Ratio 4.1 RATIO   VLDL 19 0 - 40 mg/dL   LDL Cholesterol 272 (H) 0 - 99 mg/dL    Comment:        Total Cholesterol/HDL:CHD Risk Coronary Heart Disease Risk Table                     Men   Women  1/2 Average Risk   3.4   3.3  Average Risk       5.0   4.4  2 X Average Risk   9.6   7.1  3 X Average Risk  23.4   11.0        Use the calculated Patient Ratio above and the CHD Risk Table to determine the patient's CHD Risk.        ATP III CLASSIFICATION (LDL):  <100     mg/dL   Optimal  536-644  mg/dL   Near or Above                    Optimal  130-159  mg/dL   Borderline  034-742  mg/dL   High  >595     mg/dL   Very High Performed at [facility], [address] [place], [place] [address]   TSH     Status: None   Collection Time: 01/31/17  6:35 AM  Result Value Ref Range   TSH 2.274 0.350 - 4.500 uIU/mL    Comment: Performed by a 3rd Generation assay with a functional sensitivity of <=0.01 uIU/mL. Performed at [facility], [address] [place],  [place] [address]   Urine Drug Screen, Qualitative     Status: None   Collection Time: 02/01/17  5:43 PM  Result Value Ref Range   Tricyclic, Ur Screen NONE DETECTED NONE DETECTED   Amphetamines, Ur Screen NONE DETECTED NONE DETECTED   MDMA (Ecstasy)Ur Screen NONE DETECTED NONE DETECTED    Cocaine Metabolite,Ur [place] NONE DETECTED NONE DETECTED   Opiate, Ur Screen NONE DETECTED NONE DETECTED   Phencyclidine (PCP) Ur S NONE DETECTED NONE DETECTED   Cannabinoid 50 Ng, Ur [place] NONE DETECTED NONE DETECTED   Barbiturates, Ur Screen NONE DETECTED NONE DETECTED   Benzodiazepine, Ur Scrn NONE DETECTED NONE DETECTED   Methadone Scn, Ur NONE DETECTED NONE DETECTED    Comment: (NOTE) Tricyclics + metabolites, urine    Cutoff 1000 ng/mL Amphetamines + metabolites, urine  Cutoff 1000 ng/mL MDMA (Ecstasy), urine              Cutoff 500 ng/mL Cocaine Metabolite, urine          Cutoff 300 ng/mL Opiate + metabolites, urine        Cutoff 300 ng/mL Phencyclidine (PCP), urine         Cutoff 25 ng/mL Cannabinoid, urine                 Cutoff 50 ng/mL Barbiturates + metabolites, urine  Cutoff 200 ng/mL Benzodiazepine, urine              Cutoff 200 ng/mL Methadone, urine                   Cutoff 300 ng/mL The urine drug screen provides only a preliminary, unconfirmed analytical test result and should not be used for non-medical purposes. Clinical consideration and professional judgment should be applied to any positive drug screen result due to possible interfering substances. A more specific alternate chemical method must be used in order to obtain a confirmed analytical result. Gas chromatography / mass spectrometry (GC/MS) is the preferred confirmat ory method. Performed at [facility], [address] [place], [place] [address]     Blood Alcohol level:  Lab Results  Component Value Date   [facility] <10 01/27/2017    Metabolic Disorder Labs: Lab Results  Component Value Date   HGBA1C 5.2 01/31/2017   MPG 102.54 01/31/2017   No results found for: PROLACTIN Lab Results  Component Value Date   CHOL 160 01/31/2017   TRIG 95 01/31/2017   HDL 39 (L) 01/31/2017   CHOLHDL 4.1 01/31/2017   VLDL 19 01/31/2017   LDLCALC 102 (H) 01/31/2017    Physical Findings: AIMS: Facial and  Oral Movements Muscles of Facial Expression: None, normal Lips and Perioral Area: None, normal Jaw: None, normal Tongue: None, normal,Extremity Movements Upper (arms, wrists, hands, fingers): None, normal Lower (legs, knees, ankles, toes): None, normal, Trunk Movements Neck, shoulders, hips: None, normal, Overall Severity Severity of abnormal movements (highest score from questions above): None, normal Incapacitation due to abnormal movements: None, normal Patient's awareness of abnormal movements (rate only patient's report): No Awareness, Dental Status Current problems with teeth and/or dentures?: No Does patient usually wear dentures?: No  CIWA:    COWS:     Musculoskeletal: Strength & Muscle Tone: within normal limits Gait & Station: normal Patient leans: N/A  Psychiatric Specialty Exam: Physical Exam  Psychiatric: His affect is labile. His speech is rapid and/or pressured and slurred. Thought content is paranoid and delusional. Cognition and memory are impaired. He expresses impulsivity.  Labile  Review of Systems  Constitutional: Negative.   HENT: Negative.   Eyes: Negative.   Respiratory: Negative.   Cardiovascular: Negative.   Gastrointestinal: Negative.   Genitourinary: Negative.   Musculoskeletal: Negative.   Skin: Negative.   Neurological: Negative.   Endo/Heme/Allergies: Negative.     Blood pressure 133/82, pulse 68, resp. rate 18, height 5' 9.49" (1.765 m), weight 76.2 kg (168 lb), SpO2 100 %.Body mass index is 24.46 kg/m.  General Appearance: Disheveled  Eye Contact:  Fair  Speech:  Garbled and Slurred  Volume:  Increased  Mood:  Anxious  Affect:  Irritable  Thought Process:  Disorganized  Orientation:  Other:  He did not answer questions  Thought Content:  Paranoid and delusional  Suicidal Thoughts:  No  Homicidal Thoughts:  Unclear, he hinted at rape and [employer] killings  Memory:  Immediate;   Poor Recent;   Poor Remote;   Poor  Judgement:   Impaired  Insight:  Lacking  Psychomotor Activity:  Decreased  Concentration:  Concentration: Poor and Attention Span: Poor  Recall:  Poor  Fund of Knowledge:  Poor  Language:  Fair  Akathisia:  No  Handed:  Right  AIMS (if indicated):     Assets:  Physical Health  ADL's:  Intact  Cognition:  Impaired,  Mild  Sleep:  Number of Hours: 0.75     Treatment Plan Summary:    Treatment Plan Summary: 37 yo male admitted due to psychosis. HE continues to be very disorganized but possibly slightly improved from yesterday. HE mentions that he takes "Haldol" but is unable to give much more history than that. He states that he lives with his sister but it appears the number in chart is not a working number. We do not have much more information at this time. I contacted ACT team and they stated he is no longer a client of theirs.   Plan:  Schizophrenia -He was transitioned from Zyprexa to Haldol 5 mg BID as he mentioned this as a potential medication he was taking. He can then transition to LAI due to history of noncompliance -Start Cogentin 1 mg BID for EPS -Ativan prn for agiation -Pt is on water restriction due to polydipsia -Will recheck CMP tomorrow to monitor sodium -QTc 414 on EKG -Hemoglobin A1c was 5.4, total cholesterol is 160 - TSH was WNL  Drug screen was negative for all substances  Tobacco use disorder He was offered a nicotine patch-   Dispo -Unclear. Will continue to try to get collateral from family -He will need psychotropic medication management upon discharge     Daily contact with patient to assess and evaluate symptoms and progress in treatment and Medication management  Darliss Ridgel, MD 02/01/2017, 8:02 PM

## 2017-02-02 LAB — BASIC METABOLIC PANEL
Anion gap: 10 (ref 5–15)
BUN: 18 mg/dL (ref 6–20)
CALCIUM: 9.1 mg/dL (ref 8.9–10.3)
CO2: 27 mmol/L (ref 22–32)
Chloride: 102 mmol/L (ref 101–111)
Creatinine, Ser: 0.8 mg/dL (ref 0.61–1.24)
GFR calc Af Amer: >60 mL/min (ref >60)
Glucose, Bld: 94 mg/dL (ref 65–99)
POTASSIUM: 4.3 mmol/L (ref 3.5–5.1)
SODIUM: 139 mmol/L (ref 135–145)

## 2017-02-02 MED ORDER — HALOPERIDOL 5 MG PO TABS
10.0000 mg | ORAL_TABLET | Freq: Every day | ORAL | Status: DC
Start: 2017-02-02 — End: 2017-02-14
  Administered 2017-02-02 – 2017-02-13 (×12): 10 mg via ORAL
  Filled 2017-02-02 (×12): qty 2

## 2017-02-02 MED ORDER — HALOPERIDOL 5 MG PO TABS
5.0000 mg | ORAL_TABLET | Freq: Every day | ORAL | Status: DC
  Administered 2017-02-03 – 2017-02-06 (×4): 5 mg via ORAL
  Filled 2017-02-02 (×5): qty 1

## 2017-02-02 NOTE — [facility] Group Notes (Signed)
LCSW Group Therapy Note 02/02/2017 1:15pm  Type of Therapy and Topic: Group Therapy: Feelings Around Returning Home & Establishing a Supportive Framework and Supporting Oneself When Supports Not Available  Participation Level: None  Description of Group:  Patients first processed thoughts and feelings about upcoming discharge. These included fears of upcoming changes, lack of change, new living environments, judgements and expectations from others and overall stigma of mental health issues. The group then discussed the definition of a supportive framework, what that looks and feels like, and how do to discern it from an unhealthy non-supportive network. The group identified different types of supports as well as what to do when your family/friends are less than helpful or unavailable  Therapeutic Goals  1. Patient will identify one healthy supportive network that they can use at discharge. 2. Patient will identify one factor of a supportive framework and how to tell it from an unhealthy network. 3. Patient able to identify one coping skill to use when they do not have positive supports from others. 4. Patient will demonstrate ability to communicate their needs through discussion and/or role plays.  Summary of Patient Progress:  Pt attended group and did not participate.   Therapeutic Modalities Cognitive Behavioral Therapy Motivational Interviewing   Ezel Vallone  CUEBAS-COLON, LCSW 02/02/2017 9:53 AM

## 2017-02-02 NOTE — Plan of Care (Signed)
Patient is alert to self and place. Patient  denies SI, HI and AVH although patient seems to be responding to internal stimuli. RN received report this morning patient did not sleep last night. Patient is calm, but is very paranoid. Patient fluid consumption is being monitored patient drinks 1 cup of fluid during meals. Patients vital this am BP 130/93, pulse of 89 BPM.  Activity: Will identify at least one activity in which they can participate 02/02/2017 1012 - Not Progressing by Leamon ArntPowell, Oscar Forman K, RN   Coping: Ability to identify and develop effective coping behavior will improve 02/02/2017 1012 - Not Progressing by Leamon ArntPowell, Dacey Milberger K, RN Ability to interact with others will improve 02/02/2017 1012 - Not Progressing by Leamon ArntPowell, Sita Mangen K, RN Participation in decision-making will improve 02/02/2017 1012 - Not Progressing by Leamon ArntPowell, Zaryan Yakubov K, RN Ability to use eye contact when communicating with others will improve 02/02/2017 1012 - Not Progressing by Leamon ArntPowell, Jannet Calip K, RN   Health Behavior/Discharge Planning: Identification of resources available to assist in meeting health care needs will improve 02/02/2017 1012 - Not Progressing by Leamon ArntPowell, Shlok Raz K, RN  Safety checks Q 15 minutes.

## 2017-02-02 NOTE — Progress Notes (Signed)
  D:Pt denies SI/HI/AVH. Pt is: still very preoccupied, appearing to be still responding to internal stimuli,  guarded, avertive in eye contact at times, and appears apathetic. Pt minimally cooperative in engaging with this writer, but a noticeable progression from yesterday in patient cooperation with assessment and eye contact. Pt. Able to remain safe while on the unit. Pt verbalizes he can remain safe on unit and denies SI/HI and verbally contracts. Pt able to answer alert and orientation questions with accurate answers, establishing Alert and Oriented X 4.  A: Q x 15 minute observation checks were completed for safety. Patient was provided with education, but needs reinforcement. Patient was given scheduled medications in his room, because he was unable to avoid internal stimuli long enough to follow through with his desire to follow request to come to medication room. Patient was encourage to attend groups, participate in unit activities and continue with plan of care.   R:Patient is complaint with medication and unit procedures, but did not attend groups. Pt isolative and withdrawn during evening shift. Pt eating and sleeping good per reports for previous night, but tonight did not sleep much at all. Pt observed in his room responding to internal stimuli not wanting to sleep.            Patient slept for Estimated Hours of 1; Precautionary checks every 15 minutes for safety maintained, room free of safety hazards, patient sustains no injury or falls during this shift.

## 2017-02-02 NOTE — Progress Notes (Addendum)
[facility] MD Progress Note  02/02/2017 5:20 PM Shane [facility]  MRN:  [id number]    Subjective:   The patient was calmer today and talking a little more clearly. Speech was clear overall, thought processes were still little disorganized. Paranoid thoughts appeared to have decreased but he is still responding to internal stimuli. The patient has been isolative to his room and skipped lunch today. He is willing to eat breakfast. He did attend groups but did not actively participate. He does not appear to be so preoccupied with any paranoid thoughts. He denies any current active or passive suicidal thoughts. He denies any auditory or visual hallucinations. He does appear to be responding to interna stimuli however. He kept getting phone numbers for family members that were invalid. He says he would like to be able to speak to his sister or his uncle but neither visited. He denies any somatic complaints. Vital signs are stable. He was not noted to be drinking water excessively and Na+ was WNL today.  Past Psychiatric History: History of schizophrenia. Followed with ACT team in the past but no longer a client of theirs. Unclear if he is following anyone at this time. Past trial of Zyprexa and Luvox. He was hospitalized at [facility] in 2015.   Social History: Pt states that he currently stays with his sister, Shane Nash. Unable to obtain further history at this time.  Family Psychiatric History: He denies any history of mental illness in family   Principal Problem: Disorganized schizophrenia (Shane Nash) Diagnosis:   Patient Active Problem List   Diagnosis Date Noted  . 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 Schizophrenia ([place]) [F20.9] 01/27/2017  . Hyponatremia [E87.1] 01/27/2017  . Polydipsia [R63.1] 01/27/2017   Total Time spent with patient: 20 minutes    Past Medical History:  Past Medical History:  Diagnosis Date  . Schizophrenia ([place])    History reviewed. No pertinent surgical  history.  Social History:  Social History   Substance and Sexual Activity  Alcohol Use Not on file     Social History   Substance and Sexual Activity  Drug Use No    Social History   Socioeconomic History  . Marital status: Single    Spouse name: None  . Number of children: None  . Years of education: None  . Highest education level: None  Social Needs  . Financial resource strain: None  . Food insecurity - worry: None  . Food insecurity - inability: None  . Transportation needs - medical: None  . Transportation needs - non-medical: None  Occupational History  . None  Tobacco Use  . Smoking status: Current Every Day Smoker    Packs/day: 0.25    Years: 15.00    Pack years: 3.75    Types: Cigarettes  . Smokeless tobacco: Never Used  . Tobacco comment: Cannot state the amount he smokes per day  Substance and Sexual Activity  . Alcohol use: None  . Drug use: No  . Sexual activity: Not Currently  Other Topics Concern  . None  Social History Narrative  . None   Additional Social History:                         Sleep: Fair  Appetite:  Fair  Current Medications: Current Facility-Administered Medications  Medication Dose Route Frequency Provider Last Rate Last Dose  . acetaminophen (TYLENOL) tablet 650 mg  650 mg Oral Q6H PRN [name], Madie Reno, MD      .  alum & mag hydroxide-simeth (MAALOX/MYLANTA) 200-200-20 MG/5ML suspension 30 mL  30 mL Oral Q4H PRN [name], [name], MD      . benztropine (COGENTIN) tablet 1 mg  1 mg Oral BID McNew, Tyson Babinski, MD   1 mg at 02/02/17 0815  . haloperidol (HALDOL) tablet 10 mg  10 mg Oral Q2000 Chauncey Mann, MD      . haloperidol (HALDOL) tablet 5 mg  5 mg Oral Q6H PRN McNew, Tyson Babinski, MD      . Derrill Memo ON 02/03/2017] haloperidol (HALDOL) tablet 5 mg  5 mg Oral Daily Chauncey Mann, MD      . LORazepam (ATIVAN) tablet 1 mg  1 mg Oral Q4H PRN McNew, Tyson Babinski, MD      . magnesium hydroxide (MILK OF MAGNESIA) suspension 30 mL  30  mL Oral Daily PRN [name], [name], MD      . nicotine (NICODERM CQ - dosed in mg/24 hours) patch 21 mg  21 mg Transdermal Daily McNew, Tyson Babinski, MD   21 mg at 02/02/17 0816  . sodium chloride tablet 1 g  1 g Oral TID WC [name], Madie Reno, MD   1 g at 02/02/17 1202  . traZODone (DESYREL) tablet 100 mg  100 mg Oral QHS PRN McNew, Tyson Babinski, MD        Lab Results:  Results for orders placed or performed during the hospital encounter of 01/30/17 (from the past 48 hour(s))  Urine Drug Screen, Qualitative     Status: None   Collection Time: 02/01/17  5:43 PM  Result Value Ref Range   Tricyclic, Ur Screen NONE DETECTED NONE DETECTED   Amphetamines, Ur Screen NONE DETECTED NONE DETECTED   MDMA (Ecstasy)Ur Screen NONE DETECTED NONE DETECTED   Cocaine Metabolite,Ur [place] NONE DETECTED NONE DETECTED   Opiate, Ur Screen NONE DETECTED NONE DETECTED   Phencyclidine (PCP) Ur S NONE DETECTED NONE DETECTED   Cannabinoid 50 Ng, Ur [place] NONE DETECTED NONE DETECTED   Barbiturates, Ur Screen NONE DETECTED NONE DETECTED   Benzodiazepine, Ur Scrn NONE DETECTED NONE DETECTED   Methadone Scn, Ur NONE DETECTED NONE DETECTED    Comment: (NOTE) Tricyclics + metabolites, urine    Cutoff 1000 ng/mL Amphetamines + metabolites, urine  Cutoff 1000 ng/mL MDMA (Ecstasy), urine              Cutoff 500 ng/mL Cocaine Metabolite, urine          Cutoff 300 ng/mL Opiate + metabolites, urine        Cutoff 300 ng/mL Phencyclidine (PCP), urine         Cutoff 25 ng/mL Cannabinoid, urine                 Cutoff 50 ng/mL Barbiturates + metabolites, urine  Cutoff 200 ng/mL Benzodiazepine, urine              Cutoff 200 ng/mL Methadone, urine                   Cutoff 300 ng/mL The urine drug screen provides only a preliminary, unconfirmed analytical test result and should not be used for non-medical purposes. Clinical consideration and professional judgment should be applied to any positive drug screen result due to possible interfering  substances. A more specific alternate chemical method must be used in order to obtain a confirmed analytical result. Gas chromatography / mass spectrometry (GC/MS) is the preferred confirmat ory method. Performed at [facility],  [place], [place] [address]   Basic metabolic panel     Status: None   Collection Time: 02/02/17  6:25 AM  Result Value Ref Range   Sodium 139 135 - 145 mmol/L   Potassium 4.3 3.5 - 5.1 mmol/L   Chloride 102 101 - 111 mmol/L   CO2 27 22 - 32 mmol/L   Glucose, Bld 94 65 - 99 mg/dL   BUN 18 6 - 20 mg/dL   Creatinine, Ser 0.80 0.61 - 1.24 mg/dL   Calcium 9.1 8.9 - 10.3 mg/dL   GFR calc non Af Amer >60 >60 mL/min   GFR calc Af Amer >60 >60 mL/min    Comment: (NOTE) The eGFR has been calculated using the CKD EPI equation. This calculation has not been validated in all clinical situations. eGFR's persistently <60 mL/min signify possible Chronic Kidney Disease.    Anion gap 10 5 - 15    Comment: Performed at [facility], [address] [place], [place] [address]    Blood Alcohol level:  Lab Results  Component Value Date   [facility] <10 62/03/5595    Metabolic Disorder Labs: Lab Results  Component Value Date   HGBA1C 5.2 01/31/2017   MPG 102.54 01/31/2017   No results found for: PROLACTIN Lab Results  Component Value Date   CHOL 160 01/31/2017   TRIG 95 01/31/2017   HDL 39 (L) 01/31/2017   CHOLHDL 4.1 01/31/2017   VLDL 19 01/31/2017   LDLCALC 102 (H) 01/31/2017    Physical Findings: AIMS: Facial and Oral Movements Muscles of Facial Expression: None, normal Lips and Perioral Area: None, normal Jaw: None, normal Tongue: None, normal,Extremity Movements Upper (arms, wrists, hands, fingers): None, normal Lower (legs, knees, ankles, toes): None, normal, Trunk Movements Neck, shoulders, hips: None, normal, Overall Severity Severity of abnormal movements (highest score from questions above): None,  normal Incapacitation due to abnormal movements: None, normal Patient's awareness of abnormal movements (rate only patient's report): No Awareness, Dental Status Current problems with teeth and/or dentures?: No Does patient usually wear dentures?: No  CIWA:    COWS:     Musculoskeletal: Strength & Muscle Tone: within normal limits Gait & Station: normal Patient leans: N/A  Psychiatric Specialty Exam: Physical Exam  Psychiatric: His affect is labile. His speech is rapid and/or pressured and slurred. Thought content is paranoid and delusional. Cognition and memory are impaired. He expresses impulsivity.  Labile    Review of Systems  Constitutional: Negative.   HENT: Negative.   Eyes: Negative.   Respiratory: Negative.   Cardiovascular: Negative.   Gastrointestinal: Negative.   Genitourinary: Negative.   Musculoskeletal: Negative.   Skin: Negative.   Neurological: Negative.   Endo/Heme/Allergies: Negative.     Blood pressure (!) 130/93, pulse 89, resp. rate 18, height 5' 9.49" (1.765 m), weight 76.2 kg (168 lb), SpO2 100 %.Body mass index is 24.46 kg/m.  General Appearance: Disheveled  Eye Contact:  Fair  Speech:  Garbled and Slurred  Volume:  Increased  Mood:  Anxious  Affect:  Calmer than yestersday  Thought Process:  Disorganized  Orientation:  [place], January, 2019  Thought Content:  Paranoid and delusional; may be responding to internal stimuli  Suicidal Thoughts:  No  Homicidal Thoughts:  Unclear, he hinted at rape and [place] killings  Memory:  Immediate;   Poor Recent;   Poor Remote;   Poor  Judgement:  Impaired  Insight:  Lacking  Psychomotor Activity:  Decreased  Concentration:  Concentration:  Poor and Attention Span: Poor  Recall:  Poor  Fund of Knowledge:  Poor  Language:  Fair  Akathisia:  No  Handed:  Right  AIMS (if indicated):     Assets:  Physical Health  ADL's:  Intact  Cognition:  Impaired,  Mild  Sleep:  Number of Hours: 0.75     Treatment  Plan Summary:  Mr. Marchetta is a 37 yo male admitted due to psychosis. He continues to be very disorganized but is not as anxious or paranoid. He mentions that he takes "Haldol" but is unable to give much more history than that. He states that he lives with his sister but it appears the number in chart is not a working number. We do not have much more information at this time. I contacted ACT team and they stated he is no longer a client of theirs.   Plan:  Schizophrenia -He was transitioned from Zyprexa to Haldol 5 and will increase Haldol to 17m po daily and 120mpo nightly. He can then transition to LARiceue to history of noncompliance -Start Cogentin 1 mg BID for EPS -Ativan prn for agiation -Pt is on water restriction due to polydipsia. Sodium on 02/02/17 was 139, WNL -QTc 414 on EKG -Hemoglobin A1c was 5.4, total cholesterol is 160 - TSH was WNL  Drug screen was negative for all substances  Tobacco use disorder -He was offered a nicotine patch  Dispo -Unclear. Will continue to try to get collateral from family -He will need psychotropic medication management upon discharge     Daily contact with patient to assess and evaluate symptoms and progress in treatment and Medication management  AaChauncey MannMD 02/02/2017, 5:20 PM

## 2017-02-02 NOTE — Plan of Care (Signed)
Pt. Ability to interact with others had improved from last evening. Pt. Was able to come to the medication room for his medications and wait patiently for his turn for evening medications. Pt eye contact still avertive, but improved. Pt. Able to verbally contract for safety and denies SI/HI. Pt able to remain safe while on the unit.

## 2017-02-03 MED ORDER — TRAZODONE HCL 50 MG PO TABS
50.0000 mg | ORAL_TABLET | Freq: Every day | ORAL | Status: DC
  Administered 2017-02-03 – 2017-02-13 (×11): 50 mg via ORAL
  Filled 2017-02-03 (×12): qty 1

## 2017-02-03 MED ORDER — DIVALPROEX SODIUM ER 500 MG PO TB24
500.0000 mg | ORAL_TABLET | Freq: Every day | ORAL | Status: DC
  Administered 2017-02-03 – 2017-02-13 (×11): 500 mg via ORAL
  Filled 2017-02-03 (×11): qty 1

## 2017-02-03 NOTE — [facility] Suicide Risk Assessment (Signed)
CSW looked through pt's IVC paperwork in order to obtain an alternative phone number for pt's sister, Berenice BoutonSharon Tagliaferro. CSW found the number: [phone]) [phone], which is the correct number for pt's sister.   Pt's sister reported, "He is Schizophrenic. Sometimes he is way off balance. Before we decided that he needed to be admitted--he was trying to drink out of the toilet. He is having an intense argument with some identity that is not present. The last two days prior to him coming there, the argument escalated to dying--whatever he is arguing with. He escalated to the point that he doesn't know he's supposed to be eating. He's lost a lot of weight. He's been off his medication for about a few weeks. I don't think he's been taking it. Even though I am in charge of him, I can't force him to do anything against his will. I still don't have guardianship over him. He's in my care but I can't restrain him or anything. He's involved in something called an ACTT program in [place]. He also has a doctor within the program. I think it's through psychotherapeutic Services. He had a male nurse coming out to the house giving him a shot once a week--but as long as he had that shot in his system he had that shot he was sustained. We've been out to see him, and he's definitely improved since he's been there."   Pt's sister stated that pt is able to return home upon discharge and will not have access to weapons or other means of self harm.    Heidi DachKelsey Rey Fors, MSW, LCSW 02/03/2017 9:10 AM

## 2017-02-03 NOTE — Progress Notes (Addendum)
[facility] MD Progress Note  02/03/2017 3:37 PM Shane [facility]  MRN:  [id number] Subjective:  Pt's speech is much clearer today. He is still very concrete in conversation. He is still sleeping very minimally and per nursing notes he only slept 0.75 hours. Pt states that he "wants to get out and get a licence." He also states< "I want to get my own place sometime." HE is currently staying with his sister, Shane Nash. Pt is oriented to person, place and date. Pt is able to voice that he is on Haldol and states that he feels like it is helping but not able to verbalize what is helpful. HE states that he wants to take a shower but needs soap. He is noted to have poor hygiene today. We discussed possibility of Haldol injection and he is ambivalent about this stating "I don't like shots" but would consider it. He denies any muscle stiffness and no dystonia or stiffness present on physical exam.   I Spoke with his sister, Shane Nash today for collateral. She states that this is the worse she has seen him. She states that at baseline he "functions really good, and he is self sufficient. He knows right from wrong." She states that he follows with ACTT team. Discussed that per ACT team he was discharged from there in 2017. She is very confused by this stating that he still sees ACTT team members. She states that he used to be on "a shot" and was doing really well but she is not able to report what medication it was. She states that his medications "come from [facility] in [place]."   I spoke with [facility] and they state that they have not filled anything since July 2018. The last thing he was on was Zyprexa and Luvox.   Principal Problem: Disorganized schizophrenia ([place]) Diagnosis:   Patient Active Problem List   Diagnosis Date Noted  . 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 Schizophrenia ([place]) [F20.9] 01/27/2017  . Hyponatremia [E87.1] 01/27/2017  . Polydipsia [R63.1]  01/27/2017   Total Time spent with patient: 15 minutes  Past Psychiatric History: See H7P  Past Medical History:  Past Medical History:  Diagnosis Date  . Schizophrenia ([place])    History reviewed. No pertinent surgical history. Family History: History reviewed. No pertinent family history. Family Psychiatric  History: See H&P Social History:  Social History   Substance and Sexual Activity  Alcohol Use Not on file     Social History   Substance and Sexual Activity  Drug Use No    Social History   Socioeconomic History  . Marital status: Single    Spouse name: None  . Number of children: None  . Years of education: None  . Highest education level: None  Social Needs  . Financial resource strain: None  . Food insecurity - worry: None  . Food insecurity - inability: None  . Transportation needs - medical: None  . Transportation needs - non-medical: None  Occupational History  . None  Tobacco Use  . Smoking status: Current Every Day Smoker    Packs/day: 0.25    Years: 15.00    Pack years: 3.75    Types: Cigarettes  . Smokeless tobacco: Never Used  . Tobacco comment: Cannot state the amount he smokes per day  Substance and Sexual Activity  . Alcohol use: None  . Drug use: No  . Sexual activity: Not Currently  Other Topics Concern  . None  Social History Narrative  . None   Additional Social History:                         Sleep: Poor  Appetite:  Poor  Current Medications: Current Facility-Administered Medications  Medication Dose Route Frequency Provider Last Rate Last Dose  . acetaminophen (TYLENOL) tablet 650 mg  650 mg Oral Q6H PRN [name], [name], MD      . alum & mag hydroxide-simeth (MAALOX/MYLANTA) 200-200-20 MG/5ML suspension 30 mL  30 mL Oral Q4H PRN [name], [name], MD      . benztropine (COGENTIN) tablet 1 mg  1 mg Oral BID Tavon Magnussen, Tyson Babinski, MD   1 mg at 02/03/17 5638  . haloperidol (HALDOL) tablet 10 mg  10 mg Oral QHS Chauncey Mann, MD   10 mg at 02/02/17 2212  . haloperidol (HALDOL) tablet 5 mg  5 mg Oral Q6H PRN Joffre Lucks R, MD      . haloperidol (HALDOL) tablet 5 mg  5 mg Oral Daily Chauncey Mann, MD   5 mg at 02/03/17 0903  . LORazepam (ATIVAN) tablet 1 mg  1 mg Oral Q4H PRN Sanah Kraska, Tyson Babinski, MD      . magnesium hydroxide (MILK OF MAGNESIA) suspension 30 mL  30 mL Oral Daily PRN [name], [name], MD      . nicotine (NICODERM CQ - dosed in mg/24 hours) patch 21 mg  21 mg Transdermal Daily Annajulia Lewing, Tyson Babinski, MD   21 mg at 02/02/17 0816  . sodium chloride tablet 1 g  1 g Oral TID WC [name], Madie Reno, MD   1 g at 02/03/17 1252  . traZODone (DESYREL) tablet 100 mg  100 mg Oral QHS PRN Marimar Suber, Tyson Babinski, MD        Lab Results:  Results for orders placed or performed during the hospital encounter of 01/30/17 (from the past 48 hour(s))  Urine Drug Screen, Qualitative     Status: None   Collection Time: 02/01/17  5:43 PM  Result Value Ref Range   Tricyclic, Ur Screen NONE DETECTED NONE DETECTED   Amphetamines, Ur Screen NONE DETECTED NONE DETECTED   MDMA (Ecstasy)Ur Screen NONE DETECTED NONE DETECTED   Cocaine Metabolite,Ur [place] NONE DETECTED NONE DETECTED   Opiate, Ur Screen NONE DETECTED NONE DETECTED   Phencyclidine (PCP) Ur S NONE DETECTED NONE DETECTED   Cannabinoid 50 Ng, Ur  NONE DETECTED NONE DETECTED   Barbiturates, Ur Screen NONE DETECTED NONE DETECTED   Benzodiazepine, Ur Scrn NONE DETECTED NONE DETECTED   Methadone Scn, Ur NONE DETECTED NONE DETECTED    Comment: (NOTE) Tricyclics + metabolites, urine    Cutoff 1000 ng/mL Amphetamines + metabolites, urine  Cutoff 1000 ng/mL MDMA (Ecstasy), urine              Cutoff 500 ng/mL Cocaine Metabolite, urine          Cutoff 300 ng/mL Opiate + metabolites, urine        Cutoff 300 ng/mL Phencyclidine (PCP), urine         Cutoff 25 ng/mL Cannabinoid, urine                 Cutoff 50 ng/mL Barbiturates + metabolites, urine  Cutoff 200 ng/mL Benzodiazepine, urine               Cutoff 200 ng/mL Methadone, urine  Cutoff 300 ng/mL The urine drug screen provides only a preliminary, unconfirmed analytical test result and should not be used for non-medical purposes. Clinical consideration and professional judgment should be applied to any positive drug screen result due to possible interfering substances. A more specific alternate chemical method must be used in order to obtain a confirmed analytical result. Gas chromatography / mass spectrometry (GC/MS) is the preferred confirmat ory method. Performed at [facility], [address] [place], [place] [address]   Basic metabolic panel     Status: None   Collection Time: 02/02/17  6:25 AM  Result Value Ref Range   Sodium 139 135 - 145 mmol/L   Potassium 4.3 3.5 - 5.1 mmol/L   Chloride 102 101 - 111 mmol/L   CO2 27 22 - 32 mmol/L   Glucose, Bld 94 65 - 99 mg/dL   BUN 18 6 - 20 mg/dL   Creatinine, Ser 0.80 0.61 - 1.24 mg/dL   Calcium 9.1 8.9 - 10.3 mg/dL   GFR calc non Af Amer >60 >60 mL/min   GFR calc Af Amer >60 >60 mL/min    Comment: (NOTE) The eGFR has been calculated using the CKD EPI equation. This calculation has not been validated in all clinical situations. eGFR's persistently <60 mL/min signify possible Chronic Kidney Disease.    Anion gap 10 5 - 15    Comment: Performed at [facility], [address] [place], [place] [address]    Blood Alcohol level:  Lab Results  Component Value Date   [facility] <10 76/54/6503    Metabolic Disorder Labs: Lab Results  Component Value Date   HGBA1C 5.2 01/31/2017   MPG 102.54 01/31/2017   No results found for: PROLACTIN Lab Results  Component Value Date   CHOL 160 01/31/2017   TRIG 95 01/31/2017   HDL 39 (L) 01/31/2017   CHOLHDL 4.1 01/31/2017   VLDL 19 01/31/2017   LDLCALC 102 (H) 01/31/2017    Physical Findings: AIMS: Facial and Oral Movements Muscles of Facial Expression: None, normal Lips and Perioral  Area: None, normal Jaw: None, normal Tongue: None, normal,Extremity Movements Upper (arms, wrists, hands, fingers): None, normal Lower (legs, knees, ankles, toes): None, normal, Trunk Movements Neck, shoulders, hips: None, normal, Overall Severity Severity of abnormal movements (highest score from questions above): None, normal Incapacitation due to abnormal movements: None, normal Patient's awareness of abnormal movements (rate only patient's report): No Awareness, Dental Status Current problems with teeth and/or dentures?: No Does patient usually wear dentures?: No  CIWA:    COWS:     Musculoskeletal: Strength & Muscle Tone: within normal limits Gait & Station: normal Patient leans: N/A  Psychiatric Specialty Exam: Physical Exam  Nursing note and vitals reviewed.   Review of Systems  All other systems reviewed and are negative.   Blood pressure 105/80, pulse 88, resp. rate 18, height 5' 9.49" (1.765 m), weight 76.2 kg (168 lb), SpO2 100 %.Body mass index is 24.46 kg/m.  General Appearance: Disheveled, malodorous  Eye Contact:  Minimal  Speech:  Garbled but clearer than on admission  Volume:  Decreased  Mood:  Depressed  Affect:  Flat  Thought Process:  Disorganized, concrete  Orientation:  Full (Time, Place, and Person)  Thought Content:  Illogical, responding to internal stimuli  Suicidal Thoughts:  No  Homicidal Thoughts:  No  Memory:  Immediate;   Poor  Judgement:  Impaired  Insight:  Lacking  Psychomotor Activity:  Increased, pacing halls at times  Concentration:  Concentration: Poor  Recall:  Poor  Fund of Knowledge:  Poor  Language:  Poor  Akathisia:  No      Assets:  Resilience  ADL's:  Impaired  Cognition:  Impaired,  Mild  Sleep:  Number of Hours: 0.75     Treatment Plan Summary: 37 yo male admitted due to decompensation of psychosis. He is on Haldol and symptoms are slightly improving and speech is much more clear. HE is still sleeping minimal  hours and disorganized at times. He is very concrete in conversation which may be his baseline.   Schizophrenia vs. Schizoaffective disorder -Continue Haldol 5 mg am and 10 mg qhs. This was increased over the weekend -Will consider Haldol Decanoate this week if tolerates oral -Will add Depakote 500 mg qhs for possible mood symptoms as he has not slept at all -Continue Cogentin 1 mg BID -Continue Water restriction. Sodium 139.  Dispo -Will return home with sister when stable. CSW sent referral to ACTT team to restart services. I spoke with his sister, Shane Nash today. His pharmacy reports that he has not filled any medications since July 2018.   Marylin Crosby, MD 02/03/2017, 3:37 PM

## 2017-02-03 NOTE — [facility] Suicide Risk Assessment (Signed)
[facility] INPATIENT:  Family/Significant Other Suicide Prevention Education  Suicide Prevention Education:  Education Completed; Pt's sister, Berenice BoutonSharon Addair at [phone], has been identified by the patient as the family member/significant other with whom the patient will be residing, and identified as the person(s) who will aid the patient in the event of a mental health crisis (suicidal ideations/suicide attempt).  With written consent from the patient, the family member/significant other has been provided the following suicide prevention education, prior to the and/or following the discharge of the patient.  The suicide prevention education provided includes the following:  Suicide risk factors  Suicide prevention and interventions  National Suicide Hotline telephone number  [facility] assessment telephone number  [facility] 911  [facility] and/or [facility] telephone number  Request made of family/significant other to:  Remove weapons (e.g., guns, rifles, knives), all items previously/currently identified as safety concern.    Remove drugs/medications (over-the-counter, prescriptions, illicit drugs), all items previously/currently identified as a safety concern.  The family member/significant other verbalizes understanding of the suicide prevention education information provided.  The family member/significant other agrees to remove the items of safety concern listed above.  Heidi DachKelsey Jarryn Altland, LCSW 02/03/2017, 9:11 AM

## 2017-02-03 NOTE — [facility] Group Notes (Signed)
02/03/2017  Time: 1:00PM   Type of Therapy and Topic:  Group Therapy:  Overcoming Obstacles   Participation Level:  Did Not Attend   Description of Group:   In this group patients will be encouraged to explore what they see as obstacles to their own wellness and recovery. They will be guided to discuss their thoughts, feelings, and behaviors related to these obstacles. The group will process together ways to cope with barriers, with attention given to specific choices patients can make. Each patient will be challenged to identify changes they are motivated to make in order to overcome their obstacles. This group will be process-oriented, with patients participating in exploration of their own experiences, giving and receiving support, and processing challenge from other group members.   Therapeutic Goals: 1. Patient will identify personal and current obstacles as they relate to admission. 2. Patient will identify barriers that currently interfere with their wellness or overcoming obstacles.  3. Patient will identify feelings, thought process and behaviors related to these barriers. 4. Patient will identify two changes they are willing to make to overcome these obstacles:      Summary of Patient Progress  Pt was invited to attend group but chose not to attend. CSW will continue to encourage pt to attend group throughout their admission.     Therapeutic Modalities:   Cognitive Behavioral Therapy Solution Focused Therapy Motivational Interviewing Relapse Prevention Therapy  Heidi DachKelsey Jacksyn Beeks, MSW, LCSW 02/03/2017 1:40 PM

## 2017-02-03 NOTE — Progress Notes (Signed)
D:Pt denies SI/HI/AVH. Pt is: still preoccupied, appearing to be still responding to internal stimuli, guarded, avertive in eye contact at times (but improved), and appears apathetic. Pt minimally cooperative in engaging with this writer, but another noticeable progression from yesterday in patient cooperation with assessment and eye contact. Pt. Able to remain safe while on the unit. Pt verbalizes he can remain safe on unit and denies SI/HI and verbally contracts. Pt able to answer alert and orientation questions with accurate answers, establishing Alert and Oriented X 4.  A: Q x 15 minute observation checks were completed for safety. Patient was provided with education, but needs reinforcement. Patient was given scheduled medications at the medication room this evening, a progression/improvement from yesterday, not being able to comply with evening medication pass at the medication room yesterday evening. Patient was encourage to attend groups, participate in unit activities and continue with plan of care.   R:Patient is complaint with medication and unit procedures, but did not attend groups. Pt isolative and withdrawn during evening shift. Pt eating and sleeping good per reports for previous night, but tonight did not sleep very much. Pt observed in his room responding to internal stimuli not wanting to sleep.            Patient slept for Estimated Hours of4.45; Precautionary checks every 15 minutes for safety maintained, room free of safety hazards, patient sustains no injury or falls during this shift.

## 2017-02-03 NOTE — Progress Notes (Signed)
CSW faxed ACTT referral to [facility] at [phone]. CSW will follow up as needed regarding ACTT referral.   Heidi DachKelsey Joliyah Lippens, MSW, LCSW 02/03/2017 10:22 AM

## 2017-02-03 NOTE — Progress Notes (Signed)
Received Shane Nash this am, he was compliant with his medications. He is OOB at intervals in the milieu. He denied all of the psychiatric symptoms and stated he is ready to go home. His speech is less garble this am.

## 2017-02-03 NOTE — Progress Notes (Signed)
CSW contacted [facility] regarding pt's ACTT Services with them. CSW spoke with Delores who reported that pt was discharged from [facility] in 2017 and discharged from ACTT in 04/2016. Delores reported she could send a referral form over if CSW wanted to refer pt back for ACTT services. CSW will follow up as needed.   Heidi DachKelsey Bernerd Terhune, MSW, LCSW 02/03/2017 9:28 AM   CSW contacted pt's sister, Berenice BoutonSharon Venturini at [phone], to follow up regarding pt's treatment provider. PT's sister reported being confused and stated, "they were just out here a couple months ago." Pt's sister was not able to provide information on who may have been providing services in the last few months. CSW will follow up with pt to see if he is more clear and able to provide info regarding his tx provider in the community.   Heidi DachKelsey Kaysen Deal, MSW, LCSW 02/03/2017 9:29 AM

## 2017-02-03 NOTE — Progress Notes (Signed)
Recreation Therapy Notes   Date: 01.07.2019  Time: 9:30 am  Location: Craft Room  Behavioral response: N/A  Intervention Topic: Problem Solving  Discussion/Intervention: Patient did not attend group.  Clinical Observations/Feedback:  Patient did not attend group.  [name] LRT/CTRS          Shane Nash 02/03/2017 11:28 AM 

## 2017-02-04 NOTE — [facility] Group Notes (Signed)
02/04/2017 1PM  Type of Therapy/Topic:  Group Therapy:  Feelings about Diagnosis  Participation Level:  Did Not Attend   Description of Group:   This group will allow patients to explore their thoughts and feelings about diagnoses they have received. Patients will be guided to explore their level of understanding and acceptance of these diagnoses. Facilitator will encourage patients to process their thoughts and feelings about the reactions of others to their diagnosis and will guide patients in identifying ways to discuss their diagnosis with significant others in their lives. This group will be process-oriented, with patients participating in exploration of their own experiences, giving and receiving support, and processing challenge from other group members.   Therapeutic Goals: 1. Patient will demonstrate understanding of diagnosis as evidenced by identifying two or more symptoms of the disorder 2. Patient will be able to express two feelings regarding the diagnosis 3. Patient will demonstrate their ability to communicate their needs through discussion and/or role play  Summary of Patient Progress:  Patient was encouraged and invited to attend group. Patient did not attend group. Social worker will continue to encourage group participation in the future.      Therapeutic Modalities:   Cognitive Behavioral Therapy Brief Therapy Feelings Identification    Johny ShearsCassandra  Layanna Charo, LCSW 02/04/2017 1:55 PM

## 2017-02-04 NOTE — Plan of Care (Signed)
Patient slept for Estimated Hours of ZERO; Precautionary checks every 15 minutes for safety maintained, room free of safety hazards, patient sustains no injury or falls during this shift.

## 2017-02-04 NOTE — Progress Notes (Signed)
[facility] MD Progress Note  02/04/2017 2:15 PM Shane Nash  MRN:  [id number] Subjective:  Pt has been sleeping most of the day. This is an improvement as he has not slept more than an hour since being on he unit. Pt is slightly better today. HE still has somewhat garbled speech but is much clearer than on admission. This may be his baseline speech. He is able to answer most questions rationally although very concrete in his thoughts. HE states that he "is ready to go home." He is oriented to person, place, time "January '19", president "trump". HE is able to answer these questions very quickly. HE states that he ate "eggs and grits" for breakfast. Encouraged him to take a shower today and he requests a washcloth to do this. I have encouraged the techs to assist him with showering today. He is able to voice that he is on Haldol and states that he was on this in the past. He states that it is helpful but unable to state what it helps with. He states, "I just know it helps." He denies SI< HI, AH, VH. He is not responding to internal stimuli during interview. He states that he used to be in special education classes in 6th grade. He states that he dropped out of school in 9th grade. He did not get GED. When asked if he knew what  GED was, he states, "It helps you get a high school diploma or something."   Principal Problem: Disorganized schizophrenia (HCC) Diagnosis:   Patient Active Problem List   Diagnosis Date Noted  . 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 Schizophrenia (HCC) [F20.9] 01/27/2017  . Hyponatremia [E87.1] 01/27/2017  . Polydipsia [R63.1] 01/27/2017   Total Time spent with patient: 20 minutes  Past Psychiatric History: See H&P  Past Medical History:  Past Medical History:  Diagnosis Date  . Schizophrenia (HCC)    History reviewed. No pertinent surgical history. Family History: History reviewed. No pertinent family history. Family Psychiatric   History: See H&P Social History:  Social History   Substance and Sexual Activity  Alcohol Use Not on file     Social History   Substance and Sexual Activity  Drug Use No    Social History   Socioeconomic History  . Marital status: Single    Spouse name: None  . Number of children: None  . Years of education: None  . Highest education level: None  Social Needs  . Financial resource strain: None  . Food insecurity - worry: None  . Food insecurity - inability: None  . Transportation needs - medical: None  . Transportation needs - non-medical: None  Occupational History  . None  Tobacco Use  . Smoking status: Current Every Day Smoker    Packs/day: 0.25    Years: 15.00    Pack years: 3.75    Types: Cigarettes  . Smokeless tobacco: Never Used  . Tobacco comment: Cannot state the amount he smokes per day  Substance and Sexual Activity  . Alcohol use: None  . Drug use: No  . Sexual activity: Not Currently  Other Topics Concern  . None  Social History Narrative  . None   Additional Social History:                         Sleep: Improving  Appetite:  Fair  Current Medications: Current Facility-Administered Medications  Medication Dose Route Frequency Provider  Last Rate Last Dose  . acetaminophen (TYLENOL) tablet 650 mg  650 mg Oral Q6H PRN [name], [name], MD      . alum & mag hydroxide-simeth (MAALOX/MYLANTA) 200-200-20 MG/5ML suspension 30 mL  30 mL Oral Q4H PRN [name], [name], MD      . benztropine (COGENTIN) tablet 1 mg  1 mg Oral BID Lunna Vogelgesang, Ileene Hutchinson, MD   1 mg at 02/04/17 0925  . divalproex (DEPAKOTE ER) 24 hr tablet 500 mg  500 mg Oral QHS Claus Silvestro R, MD   500 mg at 02/03/17 2149  . haloperidol (HALDOL) tablet 10 mg  10 mg Oral QHS Darliss Ridgel, MD   10 mg at 02/03/17 2148  . haloperidol (HALDOL) tablet 5 mg  5 mg Oral Q6H PRN Marti Acebo, Ileene Hutchinson, MD      . haloperidol (HALDOL) tablet 5 mg  5 mg Oral Daily Darliss Ridgel, MD   5 mg at 02/04/17 0925   . LORazepam (ATIVAN) tablet 1 mg  1 mg Oral Q4H PRN Elianny Buxbaum, Ileene Hutchinson, MD      . magnesium hydroxide (MILK OF MAGNESIA) suspension 30 mL  30 mL Oral Daily PRN [name], [name], MD      . nicotine (NICODERM CQ - dosed in mg/24 hours) patch 21 mg  21 mg Transdermal Daily Shonya Sumida, Ileene Hutchinson, MD   21 mg at 02/04/17 0925  . sodium chloride tablet 1 g  1 g Oral TID WC [name], [name], MD   1 g at 02/04/17 1255  . traZODone (DESYREL) tablet 100 mg  100 mg Oral QHS PRN Luiza Carranco, Ileene Hutchinson, MD      . traZODone (DESYREL) tablet 50 mg  50 mg Oral QHS Josy Peaden, Ileene Hutchinson, MD   50 mg at 02/03/17 2149    Lab Results: No results found for this or any previous visit (from the past 48 hour(s)).  Blood Alcohol level:  Lab Results  Component Value Date   ETH <10 01/27/2017    Metabolic Disorder Labs: Lab Results  Component Value Date   HGBA1C 5.2 01/31/2017   MPG 102.54 01/31/2017   No results found for: PROLACTIN Lab Results  Component Value Date   CHOL 160 01/31/2017   TRIG 95 01/31/2017   HDL 39 (L) 01/31/2017   CHOLHDL 4.1 01/31/2017   VLDL 19 01/31/2017   LDLCALC 102 (H) 01/31/2017    Physical Findings: AIMS: Facial and Oral Movements Muscles of Facial Expression: None, normal Lips and Perioral Area: None, normal Jaw: None, normal Tongue: None, normal,Extremity Movements Upper (arms, wrists, hands, fingers): None, normal Lower (legs, knees, ankles, toes): None, normal, Trunk Movements Neck, shoulders, hips: None, normal, Overall Severity Severity of abnormal movements (highest score from questions above): None, normal Incapacitation due to abnormal movements: None, normal Patient's awareness of abnormal movements (rate only patient's report): No Awareness, Dental Status Current problems with teeth and/or dentures?: No Does patient usually wear dentures?: No  CIWA:    COWS:     Musculoskeletal: Strength & Muscle Tone: within normal limits, no cogwheeling or stiffness on exam Gait & Station:  normal Patient leans: N/A  Psychiatric Specialty Exam: Physical Exam  Nursing note and vitals reviewed.   Review of Systems  All other systems reviewed and are negative.   Blood pressure (!) 128/91, pulse 91, resp. rate 18, height 5' 9.49" (1.765 m), weight 76.2 kg (168 lb), SpO2 100 %.Body mass index is 24.46 kg/m.  General Appearance: [employer]  Contact:  Minimal but improved  Speech:  Garbled but much clearer than on admission  Volume:  Normal  Mood:  Euthymic  Affect:  Flat  Thought Process:  Concrete but able to answer questions appropriately  Orientation:  Full (Time, Place, and Person)  Thought Content:  Logical  Suicidal Thoughts:  No  Homicidal Thoughts:  No  Memory:  Immediate;   Poor  Judgement:  Impaired  Insight:  Lacking  Psychomotor Activity:  Normal  Concentration:  Concentration: Poor  Recall:  Fair  Fund of Knowledge:  Poor  Language:  Poor  Akathisia:  No    AIMS (if indicated):   0  Assets:  Resilience  ADL's:  Impaired  Cognition:  WNL  Sleep:  Number of Hours: 0     Treatment Plan Summary: 37 yo male admitted due to acute confusion and psychosis. He has been off medications since at least July. 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 He is slightly better today and able to answer most questions appropriately. However, he is very concrete in his responses. His speech is much clearer today but still garbled at times which may be his baseline. He was not responding to internal stimuli during interview today. Hygiene remains very poor. I have encouraged staff to assist him in showering. He was sleeping most of the days and has documented 0 hours of sleep last night.   Plan:  Schizophrenia -Continue Haldol 5 mg qam and 10 mg qhs -Continue Depakote 500 mg qhs -Continue Cogentin 1 mg BID -Continue Water restriction  Dispo -Will return home when stable. CSW sent referral for ACT team.    Haskell RilingHolly  R Lesly Pontarelli, MD 02/04/2017, 2:15 PM

## 2017-02-04 NOTE — Plan of Care (Signed)
Patient reported no anxiety at this time. Patient's Safety is maintained while on unit.   Progressing Coping: Level of anxiety will decrease 02/04/2017 2113 - Progressing by Addison Naegelieynolds, Abisola Carrero I, RN Safety: Ability to remain free from injury will improve 02/04/2017 2113 - Progressing by Berkley Harveyeynolds, Compton Brigance I, RN

## 2017-02-04 NOTE — Plan of Care (Signed)
Patient is alert to self and place. Patient denies SI, HI and AVH. Patient is clearly responding to internal stimuli as evidenced by talking to himself while in his room. Patient continues to have trouble sleeping during the night. Today patient stayed in his room, isolated from peers. Patient is appropriate and pleasant,but guarded. Patient takes his medications and is eating meals. Patient remains on fluid restriction therefore receive fluid with meals only. Nurse will continue to monitor. Safety Checks Q 15 minutes will continue. Activity: Will identify at least one activity in which they can participate 02/04/2017 1326 - Not Progressing by Leamon ArntPowell, Rehan Holness K, RN   Coping: Ability to identify and develop effective coping behavior will improve 02/04/2017 1326 - Not Progressing by Leamon ArntPowell, Shaunette Gassner K, RN Ability to interact with others will improve 02/04/2017 1326 - Not Progressing by Leamon ArntPowell, Laynee Lockamy K, RN Participation in decision-making will improve 02/04/2017 1326 - Not Progressing by Leamon ArntPowell, Hebah Bogosian K, RN Ability to use eye contact when communicating with others will improve 02/04/2017 1326 - Not Progressing by Leamon ArntPowell, Patirica Longshore K, RN

## 2017-02-04 NOTE — Progress Notes (Signed)
D: Patient denies SI/HI/AVH. Patient verbally contracts for safety. Patient is calm, cooperative and pleasant. Patient isolates to room and only comes out for snack and medication. Patient has no complaints at this time.  A: Patient was assessed by this nurse. Patient was oriented to unit. Patient's safety was maintained on unit. Q x 15 minute observation checks were completed for safety. Patient care plan was reviewed. Patient was offered support and encouragement. Patient was encourage to attend groups, participate in unit activities and continue with plan of care.   R: Patient has no complaints of pain at this time. Patient is receptive to treatment and safety maintained on unit.

## 2017-02-04 NOTE — Progress Notes (Signed)
Recreation Therapy Notes  Date: 01.08.2019  Time: 9:30 am  Location: Craft Room  Behavioral response: N/A  Intervention Topic: Team work  Discussion/Intervention: Patient did not attend group. Clinical Observations/Feedback:  Patient did not attend group.  [name] LRT/CTRS          Shane Nash 02/04/2017 10:40 AM 

## 2017-02-04 NOTE — Progress Notes (Signed)
Patient ID: Shane Nash, male   DOB: Jun 02, 1980, 37 y.o.   MRN: [id number] Isolated to room, disheveled, A&Ox3, denied pain, word searching, thought blocking, animated, looking at both hands while I was talking to him as if his fingers were to relate a message before he could respond. He denied thoughts of self harm or anyone else.

## 2017-02-05 MED ORDER — CLONIDINE HCL 0.1 MG PO TABS
0.1000 mg | ORAL_TABLET | Freq: Once | ORAL | Status: AC
  Administered 2017-02-05: 0.1 mg via ORAL
  Filled 2017-02-05: qty 1

## 2017-02-05 MED ORDER — HALOPERIDOL DECANOATE 100 MG/ML IM SOLN
100.0000 mg | Freq: Once | INTRAMUSCULAR | Status: AC
  Administered 2017-02-05: 100 mg via INTRAMUSCULAR
  Filled 2017-02-05: qty 1

## 2017-02-05 MED ORDER — CLONIDINE HCL 0.1 MG PO TABS: 0.1000 mg | ORAL_TABLET | Freq: Two times a day (BID) | ORAL | Status: DC | PRN

## 2017-02-05 NOTE — Tx Team (Signed)
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 acetaminophen (TYLENOL) tablet 650 mg  650 mg Oral Q6H PRN [name], [name], MD      . alum & mag hydroxide-simeth (MAALOX/MYLANTA) 200-200-20 MG/5ML suspension 30 mL  30 mL Oral Q4H PRN [name], [name], MD      . benztropine (COGENTIN) tablet 1 mg  1 mg Oral BID McNew, Ileene HutchinsonHolly R, MD   1 mg at 02/05/17 0931  . divalproex (DEPAKOTE ER) 24 hr tablet 500 mg  500 mg Oral QHS McNew, Holly R, MD   500 mg at 02/04/17 2102  . haloperidol (HALDOL) tablet 10 mg  10 mg Oral QHS Darliss RidgelKapur, Aarti K, MD   10 mg at 02/04/17 2102  . haloperidol (HALDOL) tablet 5 mg  5 mg Oral Q6H PRN McNew, Holly R, MD      . haloperidol (HALDOL) tablet 5 mg  5 mg Oral Daily Darliss RidgelKapur, Aarti K, MD   5 mg at 02/05/17 0931  . LORazepam (ATIVAN) tablet 1 mg  1 mg Oral Q4H PRN McNew, Ileene HutchinsonHolly R, MD      . magnesium hydroxide (MILK OF MAGNESIA) suspension 30 mL  30 mL Oral Daily PRN [name], [name], MD      . nicotine (NICODERM CQ - dosed in mg/24 hours) patch 21 mg  21 mg Transdermal Daily McNew, Ileene HutchinsonHolly R, MD   21 mg at 02/05/17 0932  . sodium chloride tablet 1 g  1 g Oral TID WC [name], Jackquline DenmarkJohn T, MD   1 g at 02/05/17 0931  . traZODone (DESYREL) tablet 100 mg  100 mg Oral QHS PRN McNew, Ileene HutchinsonHolly R, MD      . traZODone (DESYREL) tablet 50 mg  50 mg Oral QHS McNew, Ileene HutchinsonHolly R, MD   50 mg at 02/04/17 2102   PTA Medications: No medications prior to admission.    Patient Stressors: Loss of Homeless after his sister put him out  Other: Apparent non compliant with his medications   Patient Strengths: Physical Health  Treatment Modalities: Medication Management, Group  therapy, Case management,  1 to 1 session with clinician, Psychoeducation, Recreational therapy.   Physician Treatment Plan for Primary Diagnosis: Disorganized schizophrenia (HCC) Long Term Goal(s): Improvement in symptoms so as ready for discharge   Short Term Goals: Ability to identify changes in lifestyle to reduce recurrence of condition will improve Ability to demonstrate self-control will improve  Medication Management: Evaluate patient's response, side effects, and tolerance of medication regimen.  Therapeutic Interventions: 1 to 1 sessions, Unit Group sessions and Medication administration.  Evaluation of Outcomes: Progressing  Physician Treatment Plan for Secondary Diagnosis: Principal Problem:   Disorganized schizophrenia (HCC)  Long Term Goal(s): Improvement in symptoms so as ready for discharge   Short Term Goals: Ability to identify changes in lifestyle to reduce recurrence of condition will improve Ability to demonstrate self-control will improve     Medication Management: Evaluate patient's response, side effects, and tolerance of medication regimen.  Therapeutic Interventions: 1 to 1 sessions, Unit Group sessions and Medication administration.  Evaluation of Outcomes: Progressing   RN Treatment Plan for Primary Diagnosis: Disorganized schizophrenia (HCC) Long Term Goal(s): Knowledge of disease and therapeutic regimen to maintain health will improve  Short Term Goals: Ability to verbalize feelings will improve, Ability to identify and develop effective coping behaviors will improve and Compliance with prescribed medications will improve  Medication Management: RN will administer medications as ordered by provider, will assess and evaluate patient's response and provide education to patient for prescribed medication. RN will report any adverse and/or side effects to prescribing provider.  Therapeutic Interventions: 1 on 1 counseling sessions, Psychoeducation,  Medication administration, Evaluate responses to treatment, Monitor vital signs and CBGs as ordered, Perform/monitor CIWA, COWS, AIMS and Fall Risk screenings as ordered, Perform wound care treatments as ordered.  Evaluation of Outcomes: Progressing   LCSW Treatment Plan for Primary Diagnosis: Disorganized schizophrenia (HCC) Long Term Goal(s): Safe transition to appropriate next level of care at discharge, Engage patient in therapeutic group addressing interpersonal concerns.  Short Term Goals: Engage patient in aftercare planning with referrals and resources, Increase emotional regulation and Increase skills for wellness and recovery  Therapeutic Interventions: Assess for all discharge needs, 1 to 1 time with Social worker, Explore available resources and support systems, Assess for adequacy in community support network, Educate family and significant other(s) on suicide prevention, Complete Psychosocial Assessment, Interpersonal group therapy.  Evaluation of Outcomes: Progressing   Progress in Treatment: Attending groups: No. Participating in groups: No. Taking medication as prescribed: Yes. Toleration medication: Yes. Family/Significant other contact made: No, will contact:  pt's sister. Patient understands diagnosis: Yes. Discussing patient identified problems/goals with staff: Yes. Medical problems stabilized or resolved: Yes. Denies suicidal/homicidal ideation: Yes. Issues/concerns per patient self-inventory: No. Other: None at this time.   New problem(s) identified: No, Describe:  None at this time.   New Short Term/Long Term Goal(s): Pt reported his goal for treatment is, "to find a job and stuff like that."   Discharge Plan or Barriers: CSW continues to assess for an appropriate discharge plan. CSW has made an ACTT referral to [facility] to ensure continued stability while in the community. Pt will also be returning home to live with his sister upon discharge.   Reason for  Continuation of Hospitalization: Delusions  Hallucinations Medication stabilization  Estimated Length of Stay: 5-7 days  Recreational Therapy: Patient Stressors: Family (My uncle brought me here after I got out of jail.) Patient Goal: Patient will attend and participate in Recreation Therapy Group Sessions x5 days   Attendees: Patient: 02/05/2017 11:16 AM  Physician: Dr. Johnella Moloney, MD 02/05/2017 11:16 AM  Nursing: Nile Riggs, RN 02/05/2017 11:16 AM  RN Care Manager:  02/05/2017 11:16 AM  Social Worker: Heidi Dach, LCSW 02/05/2017 11:16 AM  Recreational Therapist: Garret Reddish, CTRS-LTR 02/05/2017 11:16 AM  Other: Johny Shears, LCSWA 02/05/2017 11:16 AM  Other:  02/05/2017 11:16 AM  Other: 02/05/2017 11:16 AM    Scribe for Treatment Team: Heidi Dach, LCSW 02/05/2017 11:22 AM

## 2017-02-05 NOTE — Progress Notes (Signed)
D: Patient stated slept good last night .Stated appetite is good and energy level  Is normal. Stated concentration is good . Stated no Depression Denies suicidal  homicidal ideations  . Auditory hallucinations   Unable to tell writer what they are saying  Isolates to room No pain concerns . No  ADL'S. No  Interacting with peers and staff.  Received Haldol decononate A: Encourage patient participation with unit programming . Instruction  Given on  Medication , verbalize understanding. R: Voice no other concerns. Staff continue to monitor

## 2017-02-05 NOTE — [facility] Group Notes (Signed)
[facility] Group Notes:  (Nursing/MHT/Case Management/Adjunct)  Date:  02/05/2017  Time:  11:34 PM  Type of Therapy:  Group Therapy  Participation Level:  None  Participation Quality:  stood in hall out side of day room  Affect:  Appropriate  Cognitive:  Lacking  Insight:  None  Engagement in Group:  Poor  Modes of Intervention:  out side in hall  Summary of Progress/Problems:  Shane Nash 02/05/2017, 11:34 PM

## 2017-02-05 NOTE — Plan of Care (Signed)
Patient remains in room  , Limited participation with unit programing . Limited eye contact.  No interaction with his peers  Not Progressing Activity: Will identify at least one activity in which they can participate 02/05/2017 1448 - Not Progressing by Crist InfanteFarrish, Ogden Handlin A, RN Coping: Ability to identify and develop effective coping behavior will improve 02/05/2017 1448 - Not Progressing by Crist InfanteFarrish, Jnae Thomaston A, RN Ability to interact with others will improve 02/05/2017 1448 - Not Progressing by Crist InfanteFarrish, Darrion Macaulay A, RN Participation in decision-making will improve 02/05/2017 1448 - Not Progressing by Crist InfanteFarrish, Maanya Hippert A, RN Ability to use eye contact when communicating with others will improve 02/05/2017 1448 - Not Progressing by Crist InfanteFarrish, Tamarius Rosenfield A, RN Health Behavior/Discharge Planning: Identification of resources available to assist in meeting health care needs will improve 02/05/2017 1448 - Not Progressing by Crist InfanteFarrish, Jawanda Passey A, RN Self-Concept: Ability to verbalize positive feelings about self will improve 02/05/2017 1448 - Not Progressing by Crist InfanteFarrish, Danne Scardina A, RN Skin Integrity: Risk for impaired skin integrity will decrease 02/05/2017 1448 - Not Progressing by Crist InfanteFarrish, Mirabelle Cyphers A, RN Activity: Sleeping patterns will improve 02/05/2017 1448 - Not Progressing by Crist InfanteFarrish, Davinia Riccardi A, RN

## 2017-02-05 NOTE — Progress Notes (Addendum)
CSW contacted [facility] at [phone] to follow up about ACTT referral sent on Monday, 02/03/17. CSW spoke with Alfonse Rasolores who reported, "our director said that ACTT could come to the hospital to do a Comprehensive Clinical Assessment. She thinks he should be readmitted to the ACT Team but the CCA must be completed first." Alfonse RasDolores reported that she will check with the ACTT supervisor to see when she would be able to come out (this week) to do the CCA. CSW will follow up with [facility] as needed for updates/furhter information.   Heidi DachKelsey Graig Hessling, MSW, LCSW 02/05/2017 10:30 AM   [facility] Supervisor, Lyda Peronelisha Williams, will be coming on Friday, 02/07/17 to complete CCA but was unable to provide an exact time. Delores reported Elease Hashimotolisha will contact CSW to arrange a time.   Heidi DachKelsey Allina Riches, MSW, LCSW 02/05/2017 3:19 PM

## 2017-02-05 NOTE — [facility] Group Notes (Signed)
02/05/2017 1PM  Type of Therapy/Topic:  Group Therapy:  Emotion Regulation  Participation Level:  Active   Description of Group:   The purpose of this group is to assist patients in learning to regulate negative emotions and experience positive emotions. Patients will be guided to discuss ways in which they have been vulnerable to their negative emotions. These vulnerabilities will be juxtaposed with experiences of positive emotions or situations, and patients will be challenged to use positive emotions to combat negative ones. Special emphasis will be placed on coping with negative emotions in conflict situations, and patients will process healthy conflict resolution skills.  Therapeutic Goals: 1. Patient will identify two positive emotions or experiences to reflect on in order to balance out negative emotions 2. Patient will label two or more emotions that they find the most difficult to experience 3. Patient will demonstrate positive conflict resolution skills through discussion and/or role plays  Summary of Patient Progress:  Patient was encouraged and invited to attend group. Patient did not attend group. Social worker will continue to encourage group participation in the future.      Therapeutic Modalities:   Cognitive Behavioral Therapy Feelings Identification Dialectical Behavioral Therapy   Johny ShearsCassandra  Veleka Djordjevic, LCSW 02/05/2017 1:47 PM

## 2017-02-05 NOTE — Progress Notes (Addendum)
[facility] MD Progress Note  02/05/2017 2:06 PM Shane Nash  MRN:  [id number]   Subjective:  Pt appears improved today. Speech is much clearer. He is able to answer questions appropriately, although still very concrete which is likely his baseline. He does have odd mannerisms with his hands and per chart review, this has been present in the past. His baseline also appears to be very simple speech and very concrete thought process. He is very oriented to person, place and date and able to answer these questions quickly. He is able to name the months forward with no difficulty and is able to start stating them backwards but did not want to continue. We talked about what he eats when he is not in the hospital and he states, "My sister cooks sometimes and sometimes I cook. I like chicken." He stats that prior to staying with his sister he was in jail for 90 days for stealing a car. Pt is able to state that his sisters name is Jasmine December and she is older. He states that he has multiple siblings from different fathers. Pt is able to have a very appropriate conversation which is an improvement. He is tolerating Haldol with nos ide effects and no dystonia or muscle stiffness on exam. He does not appear to be responding to internal stimuli during interview. HE denies AH.  Principal Problem: Disorganized schizophrenia (HCC) Diagnosis:   Patient Active Problem List   Diagnosis Date Noted  . 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 Schizophrenia (HCC) [F20.9] 01/27/2017  . Hyponatremia [E87.1] 01/27/2017  . Polydipsia [R63.1] 01/27/2017   Total Time spent with patient: 20 minutes  Past Psychiatric History: See H&P  Past Medical History:  Past Medical History:  Diagnosis Date  . Schizophrenia (HCC)    History reviewed. No pertinent surgical history. Family History: History reviewed. No pertinent family history. Family Psychiatric  History: See H&P Social History:  Social  History   Substance and Sexual Activity  Alcohol Use Not on file     Social History   Substance and Sexual Activity  Drug Use No    Social History   Socioeconomic History  . Marital status: Single    Spouse name: None  . Number of children: None  . Years of education: None  . Highest education level: None  Social Needs  . Financial resource strain: None  . Food insecurity - worry: None  . Food insecurity - inability: None  . Transportation needs - medical: None  . Transportation needs - non-medical: None  Occupational History  . None  Tobacco Use  . Smoking status: Current Every Day Smoker    Packs/day: 0.25    Years: 15.00    Pack years: 3.75    Types: Cigarettes  . Smokeless tobacco: Never Used  . Tobacco comment: Cannot state the amount he smokes per day  Substance and Sexual Activity  . Alcohol use: None  . Drug use: No  . Sexual activity: Not Currently  Other Topics Concern  . None  Social History Narrative  . None   Additional Social History:                         Sleep: Poor but He was sleeping most of the day yesterday and today.   Appetite:  Good  Current Medications: Current Facility-Administered Medications  Medication Dose Route Frequency Provider Last Rate Last Dose  . acetaminophen (TYLENOL) tablet  650 mg  650 mg Oral Q6H PRN [name], [name], MD      . alum & mag hydroxide-simeth (MAALOX/MYLANTA) 200-200-20 MG/5ML suspension 30 mL  30 mL Oral Q4H PRN [name], [name], MD      . benztropine (COGENTIN) tablet 1 mg  1 mg Oral BID Cassidey Barrales, Ileene Hutchinson, MD   1 mg at 02/05/17 0931  . divalproex (DEPAKOTE ER) 24 hr tablet 500 mg  500 mg Oral QHS Synai Prettyman, Ileene Hutchinson, MD   500 mg at 02/04/17 2102  . haloperidol (HALDOL) tablet 10 mg  10 mg Oral QHS Darliss Ridgel, MD   10 mg at 02/04/17 2102  . haloperidol (HALDOL) tablet 5 mg  5 mg Oral Q6H PRN Caston Coopersmith, Ileene Hutchinson, MD      . haloperidol (HALDOL) tablet 5 mg  5 mg Oral Daily Darliss Ridgel, MD   5 mg at  02/05/17 0931  . haloperidol decanoate (HALDOL DECANOATE) 100 MG/ML injection 100 mg  100 mg Intramuscular Once Adilene Areola, Ileene Hutchinson, MD      . LORazepam (ATIVAN) tablet 1 mg  1 mg Oral Q4H PRN Jeni Duling, Ileene Hutchinson, MD      . magnesium hydroxide (MILK OF MAGNESIA) suspension 30 mL  30 mL Oral Daily PRN [name], [name], MD      . nicotine (NICODERM CQ - dosed in mg/24 hours) patch 21 mg  21 mg Transdermal Daily Sheryn Aldaz, Ileene Hutchinson, MD   21 mg at 02/05/17 0932  . sodium chloride tablet 1 g  1 g Oral TID WC [name], [name], MD   1 g at 02/05/17 1212  . traZODone (DESYREL) tablet 100 mg  100 mg Oral QHS PRN Shantika Bermea, Ileene Hutchinson, MD      . traZODone (DESYREL) tablet 50 mg  50 mg Oral QHS Nevae Pinnix, Ileene Hutchinson, MD   50 mg at 02/04/17 2102    Lab Results: No results found for this or any previous visit (from the past 48 hour(s)).  Blood Alcohol level:  Lab Results  Component Value Date   ETH <10 01/27/2017    Metabolic Disorder Labs: Lab Results  Component Value Date   HGBA1C 5.2 01/31/2017   MPG 102.54 01/31/2017   No results found for: PROLACTIN Lab Results  Component Value Date   CHOL 160 01/31/2017   TRIG 95 01/31/2017   HDL 39 (L) 01/31/2017   CHOLHDL 4.1 01/31/2017   VLDL 19 01/31/2017   LDLCALC 102 (H) 01/31/2017    Physical Findings: AIMS: Facial and Oral Movements Muscles of Facial Expression: None, normal Lips and Perioral Area: None, normal Jaw: None, normal Tongue: None, normal,Extremity Movements Upper (arms, wrists, hands, fingers): None, normal Lower (legs, knees, ankles, toes): None, normal, Trunk Movements Neck, shoulders, hips: None, normal, Overall Severity Severity of abnormal movements (highest score from questions above): None, normal Incapacitation due to abnormal movements: None, normal Patient's awareness of abnormal movements (rate only patient's report): No Awareness, Dental Status Current problems with teeth and/or dentures?: No Does patient usually wear dentures?: No  CIWA:     COWS:     Musculoskeletal: Strength & Muscle Tone: within normal limits Gait & Station: normal Patient leans: N/A  Psychiatric Specialty Exam: Physical Exam  ROS  Blood pressure (!) 152/101, pulse 99, temperature 98.5 F (36.9 C), temperature source Oral, resp. rate 18, height 5' 9.49" (1.765 m), weight 76.2 kg (168 lb), SpO2 99 %.Body mass index is 24.46 kg/m.  General Appearance: Disheveled but improved  Eye Contact:  Minimal but better than on admission  Speech:  Much clearer than on admission  Volume:  Normal  Mood:  Euthymic  Affect:  Flat  Thought Process: Very concrete, simple responses  Orientation:  Full (Time, Place, and Person)  Thought Content:  Logical. Denies AH other than "what I hear from my ears."   Suicidal Thoughts:  No  Homicidal Thoughts:  No  Memory:  Immediate;   Fair  Judgement:  Impaired  Insight:  Lacking  Psychomotor Activity:Odd gestures and mannerisms with hands  Concentration:  Concentration: Fair  Recall:  FiservFair  Fund of Knowledge:  Fair  Language:  Fair  Akathisia:  No    AIMS (if indicated):   2  Assets:  Desire for Improvement  ADL's:  Intact  Cognition:  WNL  Sleep:  Number of Hours: 0     Treatment Plan Summary: 37 yo male admitted due to confusion and psychosis. He was hyponatremic so likely causing confusion. He has also been noncompliant with medications contributing to decompensation. HE is better today with much clearer speech. He is very concrete and simple in conversation and this appears to be his baseline per past records. He did not appear to be responding to internal stimuli. Hygiene appears slightly improved and eye contact is slightly better. 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   Plan:  Continue Haldol 5 mg qam and 10 mg qhs. Will given Haldol decanoate 100 mg today and remaining 50 mg in a few days -Continue Depakote 500 mg qhs. Will check CMP to monitor LFTs tomorrow.  -Continue Cogentin 1 mg  BID  Insomnia -trazodone 50 mg qhs  Elevated BP -will given one time dose of Clonidine and monitor BP for need to start scheduled anti-hypertensive  Dispo -Pt will return to live with his sister on discharge. ACT referral pending Haskell RilingHolly R Samiha Denapoli, MD 02/05/2017, 2:06 PM

## 2017-02-05 NOTE — Progress Notes (Signed)
Recreation Therapy Notes  Date: 01.09.2019  Time: 9:30 am  Location: Craft Room  Behavioral response: N/A  Intervention Topic: Creative Expressions  Discussion/Intervention: Patient did not attend group. Clinical Observations/Feedback:  Patient did not attend  group.  Tomasita Beevers LRT/CTRS          Cailynn Bodnar 02/05/2017 11:38 AM

## 2017-02-05 NOTE — [facility] Group Notes (Signed)
[facility] Group Notes:  (Nursing/MHT/Case Management/Adjunct)  Date:  02/05/2017  Time:  3:16 PM  Type of Therapy:  Psychoeducational Skills  Participation Level:  Did Not Attend   Shane [name]  Shane Nash 02/05/2017, 3:16 PM

## 2017-02-06 LAB — COMPREHENSIVE METABOLIC PANEL
ALK PHOS: 94 U/L (ref 38–126)
ALT: 19 U/L (ref 17–63)
ANION GAP: 11 (ref 5–15)
AST: 29 U/L (ref 15–41)
Albumin: 3.5 g/dL (ref 3.5–5.0)
BUN: 13 mg/dL (ref 6–20)
CALCIUM: 8.9 mg/dL (ref 8.9–10.3)
CO2: 24 mmol/L (ref 22–32)
CREATININE: 0.96 mg/dL (ref 0.61–1.24)
Chloride: 103 mmol/L (ref 101–111)
Glucose, Bld: 89 mg/dL (ref 65–99)
Potassium: 3.9 mmol/L (ref 3.5–5.1)
SODIUM: 138 mmol/L (ref 135–145)
TOTAL PROTEIN: 7.8 g/dL (ref 6.5–8.1)
Total Bilirubin: 0.6 mg/dL (ref 0.3–1.2)

## 2017-02-06 NOTE — Plan of Care (Signed)
Patient slowly needing  some redirection  with daily programing . Patient compliant  with medications , staff assisting with  understanding what he is taking , Decision making skills  slowly improving on a low scale . With ADL's . Remains to have  limited  eye contact . No problems  with skin integrity

## 2017-02-06 NOTE — Progress Notes (Signed)
[facility] MD Progress Note  02/06/2017 4:20 PM Shane Nash  MRN:  [id number] Subjective:  Pt is in his room this afternoon. He was seen out of his room earlier in the day. He did try to go to groups but states that he did not like all of the talking. I attempted to differentiate if he is hearing voices. It seems that he cannot tolerate a lot of talking at once and this is why he could not handle groups and being around a lot of people. He denied hearing voices in between when we were talking. He states, "just your voice I can hear." He makes brief eye contact at times but usually looking at his hands and still making odd gestures with hands. He answer questions appropriately. He remembers my name from yesterday. He is still very oriented to person, place, time date and president. He states that he took a shower and "used soap on my body." He does appear to have better hygiene today. He states that he wants to get out of the hospital soon. HE is sleeping better and slept over 5 hours per nursing record. HE has been very calm and pleasant. He did get Haldol Decanoate injection with no issues yesterday. He is able to verbalize that he is on Haldol. He states, "I got two shots" meaning he got Haldol shot and blood work this morning.   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 Schizophrenia ([place]) [F20.9] 01/27/2017  . Hyponatremia [E87.1] 01/27/2017  . Polydipsia [R63.1] 01/27/2017   Total Time spent with patient: 20 minutes  Past Psychiatric History: See H&P  Past Medical History:  Past Medical History:  Diagnosis Date  . Schizophrenia ([place])    History reviewed. No pertinent surgical history. Family History: History reviewed. No pertinent family history. Family Psychiatric  History: See H&P Social History:  Social History   Substance and Sexual  Activity  Alcohol Use Not on file     Social History   Substance and Sexual Activity  Drug Use No    Social History   Socioeconomic History  . Marital status: Single    Spouse name: None  . Number of children: None  . Years of education: None  . Highest education level: None  Social Needs  . Financial resource strain: None  . Food insecurity - worry: None  . Food insecurity - inability: None  . Transportation needs - medical: None  . Transportation needs - non-medical: None  Occupational History  . None  Tobacco Use  . Smoking status: Current Every Day Smoker    Packs/day: 0.25    Years: 15.00    Pack years: 3.75    Types: Cigarettes  . Smokeless tobacco: Never Used  . Tobacco comment: Cannot state the amount he smokes per day  Substance and Sexual Activity  . Alcohol use: None  . Drug use: No  . Sexual activity: Not Currently  Other Topics Concern  . None  Social History Narrative  . None   Additional Social History:                         Sleep: Improving  Appetite:  Fair  Current Medications: Current Facility-Administered Medications  Medication Dose Route Frequency Provider Last Rate Last Dose  . acetaminophen (TYLENOL) tablet 650 mg  650 mg Oral  Q6H PRN [name], Madie Reno, MD      . alum & mag hydroxide-simeth (MAALOX/MYLANTA) 200-200-20 MG/5ML suspension 30 mL  30 mL Oral Q4H PRN [name], [name], MD      . benztropine (COGENTIN) tablet 1 mg  1 mg Oral BID McNew, Tyson Babinski, MD   1 mg at 02/06/17 3545  . cloNIDine (CATAPRES) tablet 0.1 mg  0.1 mg Oral BID PRN McNew, Tyson Babinski, MD      . divalproex (DEPAKOTE ER) 24 hr tablet 500 mg  500 mg Oral QHS McNew, Holly R, MD   500 mg at 02/05/17 2114  . haloperidol (HALDOL) tablet 10 mg  10 mg Oral QHS Chauncey Mann, MD   10 mg at 02/05/17 2114  . haloperidol (HALDOL) tablet 5 mg  5 mg Oral Q6H PRN McNew, Tyson Babinski, MD      . haloperidol (HALDOL) tablet 5 mg  5 mg Oral Daily Chauncey Mann, MD   5 mg at  02/06/17 6256  . LORazepam (ATIVAN) tablet 1 mg  1 mg Oral Q4H PRN McNew, Tyson Babinski, MD      . magnesium hydroxide (MILK OF MAGNESIA) suspension 30 mL  30 mL Oral Daily PRN [name], [name], MD      . nicotine (NICODERM CQ - dosed in mg/24 hours) patch 21 mg  21 mg Transdermal Daily McNew, Tyson Babinski, MD   21 mg at 02/05/17 0932  . sodium chloride tablet 1 g  1 g Oral TID WC [name], [name], MD   1 g at 02/06/17 1144  . traZODone (DESYREL) tablet 100 mg  100 mg Oral QHS PRN Marylin Crosby, MD   100 mg at 02/05/17 2115  . traZODone (DESYREL) tablet 50 mg  50 mg Oral QHS McNew, Tyson Babinski, MD   50 mg at 02/05/17 2115    Lab Results:  Results for orders placed or performed during the hospital encounter of 01/30/17 (from the past 48 hour(s))  Comprehensive metabolic panel     Status: None   Collection Time: 02/06/17  7:24 AM  Result Value Ref Range   Sodium 138 135 - 145 mmol/L   Potassium 3.9 3.5 - 5.1 mmol/L   Chloride 103 101 - 111 mmol/L   CO2 24 22 - 32 mmol/L   Glucose, Bld 89 65 - 99 mg/dL   BUN 13 6 - 20 mg/dL   Creatinine, Ser 0.96 0.61 - 1.24 mg/dL   Calcium 8.9 8.9 - 10.3 mg/dL   Total Protein 7.8 6.5 - 8.1 g/dL   Albumin 3.5 3.5 - 5.0 g/dL   AST 29 15 - 41 U/L   ALT 19 17 - 63 U/L   Alkaline Phosphatase 94 38 - 126 U/L   Total Bilirubin 0.6 0.3 - 1.2 mg/dL   GFR calc non Af Amer >60 >60 mL/min   GFR calc Af Amer >60 >60 mL/min    Comment: (NOTE) The eGFR has been calculated using the CKD EPI equation. This calculation has not been validated in all clinical situations. eGFR's persistently <60 mL/min signify possible Chronic Kidney Disease.    Anion gap 11 5 - 15    Comment: Performed at [facility], [address] [place], [place] [address]    Blood Alcohol level:  Lab Results  Component Value Date   [facility] <10 34/28/7681    Metabolic Disorder Labs: Lab Results  Component Value Date   HGBA1C 5.2 01/31/2017   MPG 102.54 01/31/2017  No results found for:  PROLACTIN Lab Results  Component Value Date   CHOL 160 01/31/2017   TRIG 95 01/31/2017   HDL 39 (L) 01/31/2017   CHOLHDL 4.1 01/31/2017   VLDL 19 01/31/2017   LDLCALC 102 (H) 01/31/2017    Physical Findings: AIMS: Facial and Oral Movements Muscles of Facial Expression: None, normal Lips and Perioral Area: None, normal Jaw: None, normal Tongue: None, normal,Extremity Movements Upper (arms, wrists, hands, fingers): Mild Lower (legs, knees, ankles, toes): None, normal, Trunk Movements Neck, shoulders, hips: None, normal, Overall Severity Severity of abnormal movements (highest score from questions above): None, normal Incapacitation due to abnormal movements: None, normal Patient's awareness of abnormal movements (rate only patient's report): No Awareness, Dental Status Current problems with teeth and/or dentures?: No Does patient usually wear dentures?: No  CIWA:    COWS:     Musculoskeletal: Strength & Muscle Tone: within normal limits Gait & Station: normal Patient leans: N/A  Psychiatric Specialty Exam: Physical Exam  Nursing note and vitals reviewed. NO muscle stiffness or cogwheeling noted on physical exam.   Review of Systems  All other systems reviewed and are negative.   Blood pressure 120/74, pulse 95, temperature 98.5 F (36.9 C), temperature source Oral, resp. rate 16, height 5' 9.49" (1.765 m), weight 76.2 kg (168 lb), SpO2 99 %.Body mass index is 24.46 kg/m.  General Appearance: IMproved hygiene  Eye Contact:  Minimal, brief periods of eye contact  Speech:  Much clearer than on admission  Volume:  Decreased  Mood:  Euthymic  Affect:  Flat  Thought Process:  Coherent and Goal Directed  Orientation:  Full (Time, Place, and Person)  Thought Content:  Logical, concrete  Suicidal Thoughts:  No  Homicidal Thoughts:  No  Memory:  Immediate;   Fair  Judgement:  Fair  Insight:  Fair  Psychomotor Activity:  Odd gestures with hands  Concentration:   Concentration: Fair  Recall:  [employer] of Knowledge:  Fair  Language:  Fair  Akathisia:  No      Assets:  Resilience  ADL's:  Intact  Cognition:  WNL  Sleep:  Number of Hours: 5.5     Treatment Plan Summary: 37 yo male admitted due to psychosis and decompensation. Pt is improving with Haldol. He is able to answer all questions rationally although very concrete in conversation. He may have autistic spectrum disorder or other developmental disorder given his uncomfortableness with eye contact, inability to tolerate loud conversations and talking, odd gestures with hands. He denies hearing voices during our interview and difficult to differentiate if he was unable to tolerate a lot of talking which appears to be what he means by 'voices in my ears." He has better hygiene today. He may be close to his baseline. He has intake with ACT team tomorrow.   Plan: Schizophrenia -Continue Haldol 5 mg qam and 10 mg qhs. He was given Haldol decanoate 100 mg yesterday. Will give remaining 50 mg next week -Continue Depakote 500 mg qhs. LTS wnl -Continue Cogentin 1 mg BID  Insomnia -trazodone 50 mg qhs  Elevated BP -now resolved  Dispo -Pt will return home with his sister on discharge. ACT will do intake tomorrow.    Marylin Crosby, MD 02/06/2017, 4:20 PM

## 2017-02-06 NOTE — Plan of Care (Signed)
  Not Progressing Activity: Will identify at least one activity in which they can participate 02/06/2017 0124 - Not Progressing by Galen ManilaVigil, Adellyn Capek E, RN Coping: Ability to identify and develop effective coping behavior will improve 02/06/2017 0124 - Not Progressing by Galen ManilaVigil, Claudius Mich E, RN Ability to interact with others will improve 02/06/2017 0124 - Not Progressing by Galen ManilaVigil, Jalyn Rosero E, RN Participation in decision-making will improve 02/06/2017 0124 - Not Progressing by Galen ManilaVigil, Londin Antone E, RN Ability to use eye contact when communicating with others will improve 02/06/2017 0124 - Not Progressing by Galen ManilaVigil, Derenda Giddings E, RN Health Behavior/Discharge Planning: Identification of resources available to assist in meeting health care needs will improve 02/06/2017 0124 - Not Progressing by Galen ManilaVigil, Eilish Mcdaniel E, RN Self-Concept: Ability to verbalize positive feelings about self will improve 02/06/2017 0124 - Not Progressing by Galen ManilaVigil, Squire Withey E, RN Skin Integrity: Risk for impaired skin integrity will decrease 02/06/2017 0124 - Not Progressing by Galen ManilaVigil, Albertus Chiarelli E, RN Activity: Sleeping patterns will improve 02/06/2017 0124 - Not Progressing by Galen ManilaVigil, Joannie Medine E, RN

## 2017-02-06 NOTE — Progress Notes (Signed)
D: Patient stated slept good last night .Stated appetite is good and energy level  Is normal. Stated concentrationpoor .   Continue  With  auditory hallucinations  No pain concerns . Appropriate ADL'S. Interacting with peers and staff.  Patient slowly needing  some redirection  with daily programing . Patient compliant  with medications , staff assisting with  understanding what he is taking , Decision making skills  slowly improving on a low scale . With ADL's . Remains to have  limited  eye contact . No problems  with skin integrity    A: Encourage patient participation with unit programming . Instruction  Given on  Medication , verbalize understanding. R: Voice no other concerns. Staff continue to monitor

## 2017-02-06 NOTE — Plan of Care (Signed)
Patient slowly needing  some redirection  with daily programing . Patient compliant  with medications , staff assisting with  understanding what he is taking , Decision making skills  slowly improving on a low scale . With ADL's . Remains to have  limited  eye contact . No problems  with skin integrity   Progressing Activity: Will identify at least one activity in which they can participate 02/06/2017 1615 - Progressing by Crist InfanteFarrish, Tomio Kirk A, RN 02/06/2017 1608 - Not Progressing by Crist InfanteFarrish, Jaxyn Rout A, RN Coping: Ability to identify and develop effective coping behavior will improve 02/06/2017 1615 - Progressing by Crist InfanteFarrish, Sadiyah Kangas A, RN 02/06/2017 1608 - Progressing by Crist InfanteFarrish, Mykell Rawl A, RN Ability to interact with others will improve 02/06/2017 1615 - Progressing by Crist InfanteFarrish, Viera Okonski A, RN 02/06/2017 1608 - Progressing by Crist InfanteFarrish, Chadwin Fury A, RN Participation in decision-making will improve 02/06/2017 1615 - Progressing by Crist InfanteFarrish, Eun Vermeer A, RN 02/06/2017 1608 - Progressing by Crist InfanteFarrish, Vallen Calabrese A, RN Ability to use eye contact when communicating with others will improve 02/06/2017 1615 - Progressing by Crist InfanteFarrish, Alison Breeding A, RN 02/06/2017 1608 - Progressing by Crist InfanteFarrish, Ataya Murdy A, RN Health Behavior/Discharge Planning: Identification of resources available to assist in meeting health care needs will improve 02/06/2017 1615 - Progressing by Crist InfanteFarrish, Kyndell Zeiser A, RN 02/06/2017 1608 - Progressing by Crist InfanteFarrish, Dolores Mcgovern A, RN Self-Concept: Ability to verbalize positive feelings about self will improve 02/06/2017 1615 - Progressing by Crist InfanteFarrish, Kenetha Cozza A, RN 02/06/2017 1608 - Progressing by Crist InfanteFarrish, Takahiro Godinho A, RN Skin Integrity: Risk for impaired skin integrity will decrease 02/06/2017 1615 - Progressing by Crist InfanteFarrish, Chester Sibert A, RN 02/06/2017 1608 - Progressing by Crist InfanteFarrish, Lamis Behrmann A, RN Activity: Sleeping patterns will improve 02/06/2017 1615 - Progressing by Crist InfanteFarrish, Iyani Dresner A, RN 02/06/2017 1608 - Progressing by Crist InfanteFarrish, Yee Gangi A, RN

## 2017-02-06 NOTE — [facility] Group Notes (Signed)
[facility] Group Notes:  (Nursing/MHT/Case Management/Adjunct)  Date:  02/06/2017  Time:  5:36 PM  Type of Therapy:  Psychoeducational Skills  Participation Level:  Did Not Attend   Shane [name]  [name] 02/06/2017, 5:36 PM

## 2017-02-06 NOTE — Progress Notes (Signed)
D: Pt denies SI/HI/AVH. Pt is pleasant and cooperative. Pt appears to be responding to internal stimuli, pt appears very suspicious on the unit and has minimal interaction with staff and peers. Pt forwards little information and usually responds with one word answers, but pt did say " I want to get back on track where I left off back to school"  A: Pt was offered support and encouragement. Pt was given scheduled medications. Pt was encourage to attend groups. Q 15 minute checks were done for safety.   R:Pt attends groups and interacts well with peers and staff. Pt is taking medication. Pt has no complaints at this time .Pt receptive to treatment and safety maintained on unit.

## 2017-02-06 NOTE — Progress Notes (Signed)
Recreation Therapy Notes   Date: 01.10.2019  Time: 9:30 am  Location: Craft Room  Behavioral response: N/A  Intervention Topic: Stress  Discussion/Intervention: Patient did not attend group. Clinical Observations/Feedback:  Patient did not attend group. Marcelino Campos LRT/CTRS            Meaghan Whistler 02/06/2017 10:40 AM 

## 2017-02-06 NOTE — [facility] Group Notes (Signed)
02/06/2017  Time: 1:00PM  Type of Therapy/Topic:  Group Therapy:  Balance in Life  Participation Level:  Did Not Attend  Description of Group:   This group will address the concept of balance and how it feels and looks when one is unbalanced. Patients will be encouraged to process areas in their lives that are out of balance and identify reasons for remaining unbalanced. Facilitators will guide patients in utilizing problem-solving interventions to address and correct the stressor making their life unbalanced. Understanding and applying boundaries will be explored and addressed for obtaining and maintaining a balanced life. Patients will be encouraged to explore ways to assertively make their unbalanced needs known to significant others in their lives, using other group members and facilitator for support and feedback.  Therapeutic Goals: 1. Patient will identify two or more emotions or situations they have that consume much of in their lives. 2. Patient will identify signs/triggers that life has become out of balance:  3. Patient will identify two ways to set boundaries in order to achieve balance in their lives:  4. Patient will demonstrate ability to communicate their needs through discussion and/or role plays  Summary of Patient Progress: Pt was invited to attend group but chose not to attend. CSW will continue to encourage pt to attend group throughout their admission.   Therapeutic Modalities:   Cognitive Behavioral Therapy Solution-Focused Therapy Assertiveness Training  Heidi DachKelsey Jamaine Quintin, MSW, LCSW 02/06/2017 1:36 PM

## 2017-02-06 NOTE — Plan of Care (Signed)
  Coping: Ability to interact with others will improve 02/06/2017 2117 - Progressing by Delos HaringPhillips, Atlee Kluth A, RN Note Pt seen on the unit , but has minimal interaction with peers   Coping: Ability to use eye contact when communicating with others will improve 02/06/2017 2117 - Progressing by Delos HaringPhillips, Sheryn Aldaz A, RN Note P tends to look away, but did have brief eye contact this evening with Clinical research associatewriter

## 2017-02-06 NOTE — [facility] Group Notes (Signed)
  02/06/2017  Time: 0900  Type of Therapy and Topic:  Group Therapy:  Setting Goals Participation Level:  Did Not Attend  Description of Group: In this process group, patients discussed using strengths to work toward goals and address challenges.  Patients identified two positive things about themselves and one goal they were working on.  Patients were given the opportunity to share openly and support each other's plan for self-empowerment.  The group discussed the value of gratitude and were encouraged to have a daily reflection of positive characteristics or circumstances.  Patients were encouraged to identify a plan to utilize their strengths to work on current challenges and goals.  Therapeutic Goals 1. Patient will verbalize personal strengths/positive qualities and relate how these can assist with achieving desired personal goals 2. Patients will verbalize affirmation of peers plans for personal change and goal setting 3. Patients will explore the value of gratitude and positive focus as related to successful achievement of goals 4. Patients will verbalize a plan for regular reinforcement of personal positive qualities and circumstances.  Summary of Patient Progress: Pt was invited to attend group but chose not to attend. CSW will continue to encourage pt to attend group throughout their admission.   Therapeutic Modalities Cognitive Behavioral Therapy Motivational Interviewing   Heidi DachKelsey Blanchie Zeleznik, MSW, LCSW 02/06/2017 9:29 AM

## 2017-02-06 NOTE — Progress Notes (Addendum)
Patient found in hall outside day room upon my arrival. Patient unable to tolerate room full of people due to "voices in my ears." Patient attended group but also remained in hallway outside door. Cooperative, yet minimally verbal throughout assessment. Body language is suspicious, apprehensive, and guarded. Compliant with HS medications and staff direction. Appears paranoid and preoccupied. Observed responding to internal stimuli. Denies pain. Reports eating and voiding adequately. Given PRN Trazodone and scheduled Trazodone together. Appears to be sleeping @0130 . Q 15 minute checks main=tained. Will continue to monitor. Patient slept 6.25 hours. Will endorse care to oncoming shift.

## 2017-02-07 MED ORDER — HALOPERIDOL 5 MG PO TABS
10.0000 mg | ORAL_TABLET | Freq: Every day | ORAL | Status: DC
  Administered 2017-02-08 – 2017-02-12 (×5): 10 mg via ORAL
  Filled 2017-02-07 (×5): qty 2

## 2017-02-07 NOTE — Progress Notes (Signed)
Recreation Therapy Notes  Date: 01.11.2019  Time: 9:30 am  Location: Craft Room  Behavioral response: N/A  Intervention Topic: Leisure  Discussion/Intervention: Patient did not attend group. Clinical Observations/Feedback:  Patient did not attend group. [name] LRT/CTRS         Shane Nash 02/07/2017 10:27 AM 

## 2017-02-07 NOTE — Progress Notes (Signed)
D- Patient alert and oriented. Patient presents in a pleasant mood stating that he slept normal, but is refusing his morning medications. Patient denies SI, HI, AVH, and pain at this time.  A- Support and encouragement provided.  Routine safety checks conducted every 15 minutes.  Patient informed to notify staff with problems or concerns.  R- Patient contracts for safety at this time. Patient compliant with treatment plan. Patient receptive, calm, and cooperative. Patient observed interacting with others on the unit without any problems or issues at this time. Patient remains safe at this time.

## 2017-02-07 NOTE — Plan of Care (Signed)
Patient is oriented to unit. Patient is up and in the milieu during snack and medication times.    Progressing Health Behavior/Discharge Planning: Identification of resources available to assist in meeting health care needs will improve 02/07/2017 2200 - Progressing by Addison Naegelieynolds, Arelly Whittenberg I, RN Skin Integrity: Risk for impaired skin integrity will decrease 02/07/2017 2200 - Progressing by Berkley Harveyeynolds, Keng Jewel I, RN

## 2017-02-07 NOTE — Progress Notes (Signed)
[facility] MD Progress Note  02/07/2017 2:13 PM Shane Nash  MRN:  [id number] Subjective:  Pt met with ACT team earlier this afternoon. Pt mostly isolates to his room and does not interact with peers. His speech is much clearer than on admission. He is still very concrete in thought process. He still has some odd gesturing with his hands which have been present since 2015 per notes. He denies seeing things or hearing voices. He does not appear to be responding to internal stimuli during interview today. He is very oriented and remembers my name again today. He states that he is wanting to get out of the hospital.   Per ACT team, they have not worked with him in about 3 years. They feel he is not at baseline due to him responding to his hands. They stated that he does not have the best hygiene at baseline but hygiene is worse than usual.   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 Schizophrenia ([place]) [F20.9] 01/27/2017  . Hyponatremia [E87.1] 01/27/2017  . Polydipsia [R63.1] 01/27/2017   Total Time spent with patient: 15 minutes  Past Psychiatric History: See H&P  Past Medical History:  Past Medical History:  Diagnosis Date  . Schizophrenia ([place])    History reviewed. No pertinent surgical history. Family History: History reviewed. No pertinent family history. Family Psychiatric  History: See H7P Social History:  Social History   Substance and Sexual Activity  Alcohol Use Not on file     Social History   Substance and Sexual Activity  Drug Use No    Social History   Socioeconomic History  . Marital status: Single    Spouse name: None  . Number of children: None  . Years of education: None  . Highest education level: None  Social Needs  . Financial resource strain: None  . Food insecurity - worry: None  . Food insecurity -  inability: None  . Transportation needs - medical: None  . Transportation needs - non-medical: None  Occupational History  . None  Tobacco Use  . Smoking status: Current Every Day Smoker    Packs/day: 0.25    Years: 15.00    Pack years: 3.75    Types: Cigarettes  . Smokeless tobacco: Never Used  . Tobacco comment: Cannot state the amount he smokes per day  Substance and Sexual Activity  . Alcohol use: None  . Drug use: No  . Sexual activity: Not Currently  Other Topics Concern  . None  Social History Narrative  . None   Additional Social History:                         Sleep: Good, much improved  Appetite:  Good  Current Medications: Current Facility-Administered Medications  Medication Dose Route Frequency Provider Last Rate Last Dose  . acetaminophen (TYLENOL) tablet 650 mg  650 mg Oral Q6H PRN [name], [name], MD      . alum & mag hydroxide-simeth (MAALOX/MYLANTA) 200-200-20 MG/5ML suspension 30 mL  30 mL Oral Q4H PRN [name], [name], MD      . benztropine (COGENTIN) tablet 1 mg  1 mg Oral BID McNew, Tyson Babinski, MD   1 mg at 02/06/17 2205  . cloNIDine (CATAPRES) tablet 0.1 mg  0.1 mg Oral BID PRN McNew, Tyson Babinski, MD      .  divalproex (DEPAKOTE ER) 24 hr tablet 500 mg  500 mg Oral QHS McNew, Holly R, MD   500 mg at 02/06/17 2206  . haloperidol (HALDOL) tablet 10 mg  10 mg Oral QHS Chauncey Mann, MD   10 mg at 02/06/17 2205  . [START ON 02/08/2017] haloperidol (HALDOL) tablet 10 mg  10 mg Oral Daily McNew, Holly R, MD      . haloperidol (HALDOL) tablet 5 mg  5 mg Oral Q6H PRN McNew, Tyson Babinski, MD      . LORazepam (ATIVAN) tablet 1 mg  1 mg Oral Q4H PRN McNew, Tyson Babinski, MD      . magnesium hydroxide (MILK OF MAGNESIA) suspension 30 mL  30 mL Oral Daily PRN [name], [name], MD      . nicotine (NICODERM CQ - dosed in mg/24 hours) patch 21 mg  21 mg Transdermal Daily McNew, Tyson Babinski, MD   21 mg at 02/05/17 0932  . sodium chloride tablet 1 g  1 g Oral TID WC [name], [name],  MD   1 g at 02/07/17 1155  . traZODone (DESYREL) tablet 100 mg  100 mg Oral QHS PRN Marylin Crosby, MD   100 mg at 02/06/17 2205  . traZODone (DESYREL) tablet 50 mg  50 mg Oral QHS McNew, Tyson Babinski, MD   50 mg at 02/06/17 2206    Lab Results:  Results for orders placed or performed during the hospital encounter of 01/30/17 (from the past 48 hour(s))  Comprehensive metabolic panel     Status: None   Collection Time: 02/06/17  7:24 AM  Result Value Ref Range   Sodium 138 135 - 145 mmol/L   Potassium 3.9 3.5 - 5.1 mmol/L   Chloride 103 101 - 111 mmol/L   CO2 24 22 - 32 mmol/L   Glucose, Bld 89 65 - 99 mg/dL   BUN 13 6 - 20 mg/dL   Creatinine, Ser 0.96 0.61 - 1.24 mg/dL   Calcium 8.9 8.9 - 10.3 mg/dL   Total Protein 7.8 6.5 - 8.1 g/dL   Albumin 3.5 3.5 - 5.0 g/dL   AST 29 15 - 41 U/L   ALT 19 17 - 63 U/L   Alkaline Phosphatase 94 38 - 126 U/L   Total Bilirubin 0.6 0.3 - 1.2 mg/dL   GFR calc non Af Amer >60 >60 mL/min   GFR calc Af Amer >60 >60 mL/min    Comment: (NOTE) The eGFR has been calculated using the CKD EPI equation. This calculation has not been validated in all clinical situations. eGFR's persistently <60 mL/min signify possible Chronic Kidney Disease.    Anion gap 11 5 - 15    Comment: Performed at [facility], [address] [place], [place] [address]    Blood Alcohol level:  Lab Results  Component Value Date   [facility] <10 81/82/9937    Metabolic Disorder Labs: Lab Results  Component Value Date   HGBA1C 5.2 01/31/2017   MPG 102.54 01/31/2017   No results found for: PROLACTIN Lab Results  Component Value Date   CHOL 160 01/31/2017   TRIG 95 01/31/2017   HDL 39 (L) 01/31/2017   CHOLHDL 4.1 01/31/2017   VLDL 19 01/31/2017   LDLCALC 102 (H) 01/31/2017    Physical Findings: AIMS: Facial and Oral Movements Muscles of Facial Expression: None, normal Lips and Perioral Area: None, normal Jaw: None, normal Tongue: None, normal,Extremity  Movements Upper (arms, wrists, hands, fingers): Mild Lower (  legs, knees, ankles, toes): None, normal, Trunk Movements Neck, shoulders, hips: None, normal, Overall Severity Severity of abnormal movements (highest score from questions above): None, normal Incapacitation due to abnormal movements: None, normal Patient's awareness of abnormal movements (rate only patient's report): No Awareness, Dental Status Current problems with teeth and/or dentures?: No Does patient usually wear dentures?: No  CIWA:    COWS:     Musculoskeletal: Strength & Muscle Tone: within normal limits Gait & Station: normal Patient leans: N/A  Psychiatric Specialty Exam: Physical Exam  ROS  Blood pressure 111/72, pulse 86, temperature 98.5 F (36.9 C), temperature source Oral, resp. rate 18, height 5' 9.49" (1.765 m), weight 76.2 kg (168 lb), SpO2 99 %.Body mass index is 24.46 kg/m.  General Appearance: Disheveled  Eye Contact:  Minimal  Speech:  Clear and Coherent  Volume:  Decreased  Mood:  Flat  Affect:  Flat  Thought Process:  Coherent  Orientation:  Full (Time, Place, and Person)  Thought Content: Very concrete  Suicidal Thoughts:  No  Homicidal Thoughts:  No  Memory:  Immediate;   Good  Judgement:  Impaired  Insight:  Lacking  Psychomotor Activity:  Normal  Concentration:  Concentration: Fair  Recall:  [employer] of Knowledge:  Fair  Language:  Fair  Akathisia:  No      Assets:  Resilience  ADL's:  Intact  Cognition:  WNL  Sleep:  Number of Hours: 6     Treatment Plan Summary: 36 yo male admitted due to decompensation. He is on Haldol Dec and speech is much clearer and more organized in thoughts. HE is very concrete and difficult to ascertain whether he is experiencing certain symptoms. He denies hearing voices or seeing things. ACT team feels he is not at baseline although they have not worked with him for 3 years.   Plan:  Schizophrenia -Increase Haldol to 10 mg BID. He will get  remaining 100 mg of Haldol Dec next week -Continue Depakote 500 mg qhs  -Continue Cogentin 1 mg BID  Insomnia -trazodone 50 mg qhs  Dispo -Pt will return home with sister on discharge, ACT did assessment today  Marylin Crosby, MD 02/07/2017, 2:13 PM

## 2017-02-07 NOTE — Plan of Care (Signed)
Patient states the he slept "normal" last night and denies SI/HI and pain at this time. Patient reports that "hear with my ears" when this writer asked patient if he was hearing anything that he didn't believe was there. Patient's eye contact was fair when communicating with this Clinical research associatewriter. Patient's risk for impaired skin integrity is decreasing. Patient has not identified any activity that he can participate in. Patient has not identified any effective coping skills. Patient isolates to his room majority of the day but has been observed out in the milieu interacting well with others on the unit. Patient has not been able to identify any available resources that can assist him in meeting his health care needs. Patient is safe on the unit at this time.

## 2017-02-07 NOTE — Progress Notes (Signed)
CSW contacted [facility] to follow up about when Shane Nash was coming to complete CCA. Delores stated that they are currently in their morning meeting but she would follow up with Alisha immediately following the meeting and will contact CSW ASAP. CSW will follow up as needed.   Heidi DachKelsey Helen Winterhalter, MSW, LCSW 02/07/2017 9:43 AM

## 2017-02-07 NOTE — Progress Notes (Signed)
CSW received a call from Artist Pais at [facility]. Lars Mage reported she came and met with pt to complete CCA for ACTT services. Alisha reported, "He's declined a lot. He hasn't been this bad in over three years. We stepped him down to CST and I don't know who they stepped him down to. The sister that he lives with is not as stable and I'm not surprised she wasn't able to give you much information. She works a lot and has her own stuff to worry about too. This is definitely not his baseline. His baseline is still disorganized and not as engaged, but he doesn't respond to internal stimuli. When I was meeting with him today, he kept bringing his hands up and it was like he was seeing things. His hygiene has never been the best, but this is worse than I've ever, ever seen him." Lars Mage reported she will complete pt's CCA ASAP and will be in touch with CSW to coordinate pt's care. CSW will continue to communicate with Lars Mage as needed for updates and discharge planning.   Alden Hipp, MSW, LCSW 02/07/2017 1:47 PM

## 2017-02-07 NOTE — [facility] Group Notes (Signed)
  02/07/2017  Time: 1:00PM  Type of Therapy and Topic:  Group Therapy:  Feelings around Relapse and Recovery  Participation Level:  Did Not Attend   Description of Group:    Patients in this group will discuss emotions they experience before and after a relapse. They will process how experiencing these feelings, or avoidance of experiencing them, relates to having a relapse. Facilitator will guide patients to explore emotions they have related to recovery. Patients will be encouraged to process which emotions are more powerful. They will be guided to discuss the emotional reaction significant others in their lives may have to their relapse or recovery. Patients will be assisted in exploring ways to respond to the emotions of others without this contributing to a relapse.  Therapeutic Goals: 1. Patient will identify two or more emotions that lead to a relapse for them 2. Patient will identify two emotions that result when they relapse 3. Patient will identify two emotions related to recovery 4. Patient will demonstrate ability to communicate their needs through discussion and/or role plays   Summary of Patient Progress: Pt was invited to attend group but chose not to attend. CSW will continue to encourage pt to attend group throughout their admission.   Therapeutic Modalities:   Cognitive Behavioral Therapy Solution-Focused Therapy Assertiveness Training Relapse Prevention Therapy  Heidi DachKelsey Gamal Todisco, MSW, LCSW 02/07/2017 1:47 PM

## 2017-02-08 NOTE — Progress Notes (Addendum)
[facility] MD Progress Note  02/08/2017 7:16 PM Shane Nash  MRN:  [id number] Subjective:   Pt was seen walking around in the hallway.  He refused his medications in the morning but then announced he was ready to take them a few hours later.  He says his mood is fine and denies avh, denies si/hi.  His thought process is concrete and he looks a bit spacey.    Principal Problem: Disorganized schizophrenia ([facility]) Diagnosis:   Patient Active Problem List   Diagnosis Date Noted  . 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 Schizophrenia ([facility]) [F20.9] 01/27/2017  . Hyponatremia [E87.1] 01/27/2017  . Polydipsia [R63.1] 01/27/2017   Total Time spent with patient: 15 minutes  Past Psychiatric History: See H&P  Past Medical History:  Past Medical History:  Diagnosis Date  . Schizophrenia ([facility])    History reviewed. No pertinent surgical history. Family History: History reviewed. No pertinent family history. Family Psychiatric  History: See H7P Social History:  Social History   Substance and Sexual Activity  Alcohol Use Not on file     Social History   Substance and Sexual Activity  Drug Use No    Social History   Socioeconomic History  . Marital status: Single    Spouse name: None  . Number of children: None  . Years of education: None  . Highest education level: None  Social Needs  . Financial resource strain: None  . Food insecurity - worry: None  . Food insecurity - inability: None  . Transportation needs - medical: None  . Transportation needs - non-medical: None  Occupational History  . None  Tobacco Use  . Smoking status: Current Every Day Smoker    Packs/day: 0.25    Years: 15.00    Pack years: 3.75    Types: Cigarettes  . Smokeless tobacco: Never Used  . Tobacco comment: Cannot state the amount he smokes per day  Substance and Sexual Activity  . Alcohol use: None  . Drug use: No  . Sexual activity: Not Currently  Other Topics Concern  . None   Social History Narrative  . None   Additional Social History:                         Sleep: Good Appetite:  Good  Current Medications: Current Facility-Administered Medications  Medication Dose Route Frequency Provider Last Rate Last Dose  . acetaminophen (TYLENOL) tablet 650 mg  650 mg Oral Q6H PRN [name], [name], MD      . alum & mag hydroxide-simeth (MAALOX/MYLANTA) 200-200-20 MG/5ML suspension 30 mL  30 mL Oral Q4H PRN [name], [name], MD      . benztropine (COGENTIN) tablet 1 mg  1 mg Oral BID McNew, Ileene Hutchinson, MD   1 mg at 02/08/17 1002  . cloNIDine (CATAPRES) tablet 0.1 mg  0.1 mg Oral BID PRN McNew, Ileene Hutchinson, MD      . divalproex (DEPAKOTE ER) 24 hr tablet 500 mg  500 mg Oral QHS McNew, Holly R, MD   500 mg at 02/07/17 2117  . haloperidol (HALDOL) tablet 10 mg  10 mg Oral QHS Darliss Ridgel, MD   10 mg at 02/07/17 2117  . haloperidol (HALDOL) tablet 10 mg  10 mg Oral Daily McNew, Ileene Hutchinson, MD   10 mg at 02/08/17 1001  . haloperidol (HALDOL) tablet 5 mg  5 mg Oral Q6H PRN McNew, Ileene Hutchinson, MD      .  LORazepam (ATIVAN) tablet 1 mg  1 mg Oral Q4H PRN McNew, Ileene HutchinsonHolly R, MD      . magnesium hydroxide (MILK OF MAGNESIA) suspension 30 mL  30 mL Oral Daily PRN [name], [name], MD      . nicotine (NICODERM CQ - dosed in mg/24 hours) patch 21 mg  21 mg Transdermal Daily McNew, Ileene HutchinsonHolly R, MD   21 mg at 02/05/17 0932  . sodium chloride tablet 1 g  1 g Oral TID WC [name], [name], MD   1 g at 02/08/17 1001  . traZODone (DESYREL) tablet 100 mg  100 mg Oral QHS PRN Haskell RilingMcNew, Holly R, MD   100 mg at 02/06/17 2205  . traZODone (DESYREL) tablet 50 mg  50 mg Oral QHS McNew, Ileene HutchinsonHolly R, MD   50 mg at 02/07/17 2125    Lab Results:  No results found for this or any previous visit (from the past 48 hour(s)).  Blood Alcohol level:  Lab Results  Component Value Date   ETH <10 01/27/2017    Metabolic Disorder Labs: Lab Results  Component Value Date   HGBA1C 5.2 01/31/2017   MPG 102.54 01/31/2017    No results found for: PROLACTIN Lab Results  Component Value Date   CHOL 160 01/31/2017   TRIG 95 01/31/2017   HDL 39 (L) 01/31/2017   CHOLHDL 4.1 01/31/2017   VLDL 19 01/31/2017   LDLCALC 102 (H) 01/31/2017    Physical Findings: AIMS: Facial and Oral Movements Muscles of Facial Expression: None, normal Lips and Perioral Area: None, normal Jaw: None, normal Tongue: None, normal,Extremity Movements Upper (arms, wrists, hands, fingers): Mild Lower (legs, knees, ankles, toes): None, normal, Trunk Movements Neck, shoulders, hips: None, normal, Overall Severity Severity of abnormal movements (highest score from questions above): None, normal Incapacitation due to abnormal movements: None, normal Patient's awareness of abnormal movements (rate only patient's report): No Awareness, Dental Status Current problems with teeth and/or dentures?: No Does patient usually wear dentures?: No  CIWA:    COWS:     Musculoskeletal: Strength & Muscle Tone: within normal limits Gait & Station: normal Patient leans: N/A  Psychiatric Specialty Exam: Physical Exam   ROS   Blood pressure 121/78, pulse 74, temperature 97.6 F (36.4 C), temperature source Oral, resp. rate 18, height 5' 9.49" (1.765 m), weight 76.2 kg (168 lb), SpO2 99 %.Body mass index is 24.46 kg/m.  General Appearance: Disheveled  Eye Contact:  Minimal  Speech:  Clear and Coherent  Volume:  Decreased  Mood:  Flat  Affect:  Flat  Thought Process:  Coherent  Orientation:  Full (Time, Place, and Person)  Thought Content: Very concrete  Suicidal Thoughts:  No  Homicidal Thoughts:  No  Memory:  Immediate;   Good  Judgement:  Impaired  Insight:  Lacking  Psychomotor Activity:  Normal  Concentration:  Concentration: Fair  Recall:  [employer]  Fund of Knowledge:  Fair  Language:  Fair  Akathisia:  No      Assets:  Resilience  ADL's:  Intact  Cognition:  WNL  Sleep:  Number of Hours: 6     Treatment Plan Summary: 37  yo male admitted due to decompensation. He is on Haldol Dec and speech is much clearer and more organized in thoughts. HE is very concrete and difficult to ascertain whether he is experiencing certain symptoms. He denies hearing voices or seeing things. ACT team feels he is not at baseline although they have not worked with him for  3 years.   Plan:  Schizophrenia -continue haldol 10 mg BID, monitor for compliance. He will get remaining 100 mg of Haldol Dec next week -Continue Depakote 500 mg qhs  -Continue Cogentin 1 mg BID -encouraging a shower this weekend as pt has poor hygiene  Insomnia -trazodone 50 mg qhs  Dispo -Pt will return home with sister on discharge, ACT did assessment 1/11  Cindee Lame, MD 02/08/2017, 7:16 PM

## 2017-02-08 NOTE — Plan of Care (Signed)
Patient denies any SI/HI at this time. Patient seen in milieu for brief periods of time.    Progressing Coping: Ability to interact with others will improve 02/08/2017 2045 - Progressing by Addison Naegelieynolds, Arilla Hice I, RN Self-Concept: Ability to verbalize positive feelings about self will improve 02/08/2017 2045 - Progressing by Berkley Harveyeynolds, Bricen Victory I, RN

## 2017-02-08 NOTE — Plan of Care (Signed)
Patient has been observed out in the milieu interacting well with members on the unit. Patient has been making decisions for himself, patient starts out refusing his medications until later on in the day, but then will decided to take his medications after he gets up out of the bed. Patient's eye contact has been fair throughout this writer's assessment and any encounter this writer had with patient. Patient's risk for impaired skin integrity has decreased and his sleeping patterns has improved. Patient has not identified any activities in which he can participate in, nor has he identified any effective coping behaviors other than isolating to his room. Patient has not identified any resources as of yet that can assist him in meeting his health care needs. Patient is safe on the unit at this time.

## 2017-02-08 NOTE — Progress Notes (Signed)
D- Patient alert and oriented. Patient presents in a pleasant, but apprehensive mood on assessment. Patient is isolating to his room for the majority of the morning. When this writer asked the patient how he's feeling, patient states "I feel good, I don't need my medicine right now". Patient denies SI, HI, AVH, as well as signs/symptoms of depression/anxiety and pain at this time.  A- Scheduled medications administered to patient, per MD orders. Support and encouragement provided.  Routine safety checks conducted every 15 minutes.  Patient informed to notify staff with problems or concerns.  R- No adverse drug reactions noted. Patient contracts for safety at this time. Patient compliant with medications and treatment plan. Patient receptive, calm, and cooperative. Patient interacts well with others on the unit.  Patient remains safe at this time.

## 2017-02-08 NOTE — Progress Notes (Signed)
D: Patient denies SI/HI/AVH. Patient verbally contracts for safety. Patient is calm, cooperative and pleasant. Patient isolates to room, is seen in milieu during snack does not interact with peers. Patient has no complaints at this time.  A: Patient was assessed by this nurse. Patient was oriented to unit. Patient's safety was maintained on unit. Q x 15 minute observation checks were completed for safety. Patient care plan was reviewed. Patient was offered support and encouragement. Patient was encourage to attend groups, participate in unit activities and continue with plan of care.   R: Patient has no complaints of pain at this time. Patient is receptive to treatment and safety maintained on unit. Patient slept: 5 hrs 45 minutes.

## 2017-02-08 NOTE — [facility] Group Notes (Signed)
LCSW Group Therapy Note  02/08/2017 1:15pm  Type of Therapy and Topic:  Group Therapy:  Cognitive Distortions  Participation Level:  None   Description of Group:    Patients in this group will be introduced to the topic of cognitive distortions.  Patients will identify and describe cognitive distortions, describe the feelings these distortions create for them.  Patients will identify one or more situations in their personal life where they have cognitively distorted thinking and will verbalize challenging this cognitive distortion through positive thinking skills.  Patients will practice the skill of using positive affirmations to challenge cognitive distortions using affirmation cards.    Therapeutic Goals:  1. Patient will identify two or more cognitive distortions they have used 2. Patient will identify one or more emotions that stem from use of a cognitive distortion 3. Patient will demonstrate use of a positive affirmation to counter a cognitive distortion through discussion and/or role play. 4. Patient will describe one way cognitive distortions can be detrimental to wellness   Summary of Patient Progress: Pt attended group but did not participate. Pt only stayed in the room for a few minutes.      Therapeutic Modalities:   Cognitive Behavioral Therapy Motivational Interviewing   Shane Nash  CUEBAS-COLON, LCSW 02/08/2017 11:21 AM

## 2017-02-09 NOTE — Plan of Care (Signed)
  Not Progressing Coping: Ability to interact with others will improve 02/09/2017 2201 - Not Progressing by Addison Naegelieynolds, Zynasia Burklow I, RN Activity: Sleeping patterns will improve 02/09/2017 2201 - Not Progressing by Berkley Harveyeynolds, Connee Ikner I, RN

## 2017-02-09 NOTE — [facility] Group Notes (Signed)
[facility] Group Notes:  (Nursing/MHT/Case Management/Adjunct)  Date:  02/09/2017  Time:  5:38 AM  Type of Therapy:  Psychoeducational Skills  Participation Level:  Did Not Attend   Summary of Progress/Problems:  [name] 02/09/2017, 5:38 AM

## 2017-02-09 NOTE — Plan of Care (Signed)
Patient stated to this writer that he has learned some things while here, he just doesn't like to talk about them. Patient has been going to groups and staying as long as he feels comfortable enough to participate. Patient has been observed interacting with other members on the unit without any issues. Patient's eye contact has been fair when communicating with this Clinical research associatewriter. Patient has participated in making decisions for himself such as participating in self-care as well as deciding if/and when to take his medication. Patient states that he slept good last night and "I feel all right, I think I can be released today". Patient has not identified any resources available to assist him in meeting his healthcare needs as of yet. Patient is safe on the unit at this time.

## 2017-02-09 NOTE — Progress Notes (Signed)
D: Patient denies SI/HI/AVH. Patient verbally contracts for safety. Patient is calm, cooperative and pleasant. Patient isolates to room, is seen in milieu during snack time, patient does not interact with peers. Patient has no complaints at this time.  A: Patient was assessed by this nurse. Patient was oriented to unit. Patient's safety was maintained on unit. Q x 15 minute observation checks were completed for safety. Patient care plan was reviewed. Patient was offered support and encouragement. Patient was encourage to attend groups, participate in unit activities and continue with plan of care.   R: Patient has no complaints of pain at this time. Patient is receptive to treatment and safety maintained on unit.

## 2017-02-09 NOTE — Progress Notes (Signed)
[facility] MD Progress Note  02/09/2017 7:48 PM LAMOND GLANTZ  MRN:  [id number] Subjective:   Pt was seen walking around in the hallway.  He took his medications on time this morning.  He showered yesterday and looks fresher today.  States he is doing well.  When asked about avh "I only hear what's in my ears" which I think means he denies auditory hallucinations.  No complaints today.  No si/ Principal Problem: Disorganized schizophrenia (HCC) Diagnosis:   Patient Active Problem List   Diagnosis Date Noted  . 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 Schizophrenia (HCC) [F20.9] 01/27/2017  . Hyponatremia [E87.1] 01/27/2017  . Polydipsia [R63.1] 01/27/2017   Total Time spent with patient: 15 minutes  Past Psychiatric History: See H&P  Past Medical History:  Past Medical History:  Diagnosis Date  . Schizophrenia (HCC)    History reviewed. No pertinent surgical history. Family History: History reviewed. No pertinent family history. Family Psychiatric  History: See H7P Social History:  Social History   Substance and Sexual Activity  Alcohol Use Not on file     Social History   Substance and Sexual Activity  Drug Use No    Social History   Socioeconomic History  . Marital status: Single    Spouse name: None  . Number of children: None  . Years of education: None  . Highest education level: None  Social Needs  . Financial resource strain: None  . Food insecurity - worry: None  . Food insecurity - inability: None  . Transportation needs - medical: None  . Transportation needs - non-medical: None  Occupational History  . None  Tobacco Use  . Smoking status: Current Every Day Smoker    Packs/day: 0.25    Years: 15.00    Pack years: 3.75    Types: Cigarettes  . Smokeless tobacco: Never Used  . Tobacco comment: Cannot state the amount he smokes per day  Substance and Sexual Activity  . Alcohol use: None  . Drug use: No  . Sexual activity: Not Currently   Other Topics Concern  . None  Social History Narrative  . None   Additional Social History:                         Sleep: Good Appetite:  Good  Current Medications: Current Facility-Administered Medications  Medication Dose Route Frequency Provider Last Rate Last Dose  . acetaminophen (TYLENOL) tablet 650 mg  650 mg Oral Q6H PRN [name], [name], MD      . alum & mag hydroxide-simeth (MAALOX/MYLANTA) 200-200-20 MG/5ML suspension 30 mL  30 mL Oral Q4H PRN [name], [name], MD      . benztropine (COGENTIN) tablet 1 mg  1 mg Oral BID McNew, Ileene Hutchinson, MD   1 mg at 02/09/17 0807  . cloNIDine (CATAPRES) tablet 0.1 mg  0.1 mg Oral BID PRN McNew, Ileene Hutchinson, MD      . divalproex (DEPAKOTE ER) 24 hr tablet 500 mg  500 mg Oral QHS McNew, Ileene Hutchinson, MD   500 mg at 02/08/17 2116  . haloperidol (HALDOL) tablet 10 mg  10 mg Oral QHS Darliss Ridgel, MD   10 mg at 02/08/17 2115  . haloperidol (HALDOL) tablet 10 mg  10 mg Oral Daily McNew, Ileene Hutchinson, MD   10 mg at 02/09/17 5784  . haloperidol (HALDOL) tablet 5 mg  5 mg Oral Q6H PRN McNew, Ileene Hutchinson, MD      .  LORazepam (ATIVAN) tablet 1 mg  1 mg Oral Q4H PRN McNew, Ileene Hutchinson, MD      . magnesium hydroxide (MILK OF MAGNESIA) suspension 30 mL  30 mL Oral Daily PRN [name], [name], MD      . nicotine (NICODERM CQ - dosed in mg/24 hours) patch 21 mg  21 mg Transdermal Daily McNew, Ileene Hutchinson, MD   21 mg at 02/09/17 0807  . sodium chloride tablet 1 g  1 g Oral TID WC [name], [name], MD   1 g at 02/09/17 1703  . traZODone (DESYREL) tablet 100 mg  100 mg Oral QHS PRN Haskell Riling, MD   100 mg at 02/06/17 2205  . traZODone (DESYREL) tablet 50 mg  50 mg Oral QHS McNew, Ileene Hutchinson, MD   50 mg at 02/08/17 2116    Lab Results:  No results found for this or any previous visit (from the past 48 hour(s)).  Blood Alcohol level:  Lab Results  Component Value Date   ETH <10 01/27/2017    Metabolic Disorder Labs: Lab Results  Component Value Date   HGBA1C 5.2  01/31/2017   MPG 102.54 01/31/2017   No results found for: PROLACTIN Lab Results  Component Value Date   CHOL 160 01/31/2017   TRIG 95 01/31/2017   HDL 39 (L) 01/31/2017   CHOLHDL 4.1 01/31/2017   VLDL 19 01/31/2017   LDLCALC 102 (H) 01/31/2017    Physical Findings: AIMS: Facial and Oral Movements Muscles of Facial Expression: None, normal Lips and Perioral Area: None, normal Jaw: None, normal Tongue: None, normal,Extremity Movements Upper (arms, wrists, hands, fingers): Mild Lower (legs, knees, ankles, toes): None, normal, Trunk Movements Neck, shoulders, hips: None, normal, Overall Severity Severity of abnormal movements (highest score from questions above): None, normal Incapacitation due to abnormal movements: None, normal Patient's awareness of abnormal movements (rate only patient's report): No Awareness, Dental Status Current problems with teeth and/or dentures?: No Does patient usually wear dentures?: No  CIWA:    COWS:     Musculoskeletal: Strength & Muscle Tone: within normal limits Gait & Station: normal Patient leans: N/A  Psychiatric Specialty Exam: Physical Exam   ROS   Blood pressure 112/72, pulse 66, temperature 97.9 F (36.6 C), temperature source Oral, resp. rate 18, height 5' 9.49" (1.765 m), weight 76.2 kg (168 lb), SpO2 99 %.Body mass index is 24.46 kg/m.  General Appearance: Casual  He showered, appearance improved  Eye Contact:  Poor  Speech:  Clear and Coherent  Volume:  Normal  Mood:  Flat  Affect:  Constricted  Thought Process:  Coherent  Orientation:  Full (Time, Place, and Person)  Thought Content: Very concrete  Suicidal Thoughts:  No  Homicidal Thoughts:  No  Memory:  Immediate;   Good  Judgement:  Impaired  Insight:  Lacking  Psychomotor Activity:  Normal  Concentration:  Concentration: Fair  Recall:  [employer] of Knowledge:  Fair  Language:  Fair  Akathisia:  No      Assets:  Resilience  ADL's:  Intact  Cognition:   WNL  Sleep:  Number of Hours: 6     Treatment Plan Summary: 37 yo male admitted due to decompensation. He is on Haldol Dec and speech is much clearer and more organized in thoughts. HE is very concrete and difficult to ascertain whether he is experiencing certain symptoms. He denies hearing voices or seeing things. ACT team feels he is not at baseline although they have  not worked with him for 3 years.   Plan:  Schizophrenia -continue haldol 10 mg BID, monitor for compliance. He will get remaining 100 mg of Haldol Dec next week -Continue Depakote 500 mg qhs  -Continue Cogentin 1 mg BID -monitor self care. He showered on 1/12   Insomnia -trazodone 50 mg qhs  Dispo -Pt will return home with sister on discharge, ACT did assessment 1/11  Cindee LameLauren M Abdi Husak, MD 02/09/2017, 7:48 PM

## 2017-02-09 NOTE — Progress Notes (Signed)
D: Patient denies SI/HI/AVH. Patient verbally contracts for safety. Patient is calm, cooperative and pleasant. Patient isolates to room, is out for snack and medications. Patient has no complaints at this time.  A: Patient was assessed by this nurse. Patient was oriented to unit. Patient's safety was maintained on unit. Q x 15 minute observation checks were completed for safety. Patient care plan was reviewed. Patient was offered support and encouragement. Patient was encourage to attend groups, participate in unit activities and continue with plan of care.   R: Patient has no complaints of pain at this time. Patient is receptive to treatment and safety maintained on unit.

## 2017-02-09 NOTE — Progress Notes (Signed)
D- Patient alert and oriented. Patient presents in a pleasant, but apprehensive mood on assessment stating "I feel all right, I think I can be released today". When this writer asked patient if he has learned anything to help him when he leaves patient states "yeah, I just don't talk about it". This Clinical research associatewriter also asked patient if he feels like taking his medication is helping him and he states "I hope so". Patient denies SI, HI, visual hallucinations, and pain at this time. When asked about auditory hallucinations, patient states "I hear what's in my ears".  A- Scheduled medications administered to patient, per MD orders. Support and encouragement provided.  Routine safety checks conducted every 15 minutes.  Patient informed to notify staff with problems or concerns.  R- No adverse drug reactions noted. Patient contracts for safety at this time. Patient compliant with medications and treatment plan. Patient receptive, calm, and cooperative. Patient interacts well with others on the unit.  Patient remains safe at this time.

## 2017-02-09 NOTE — [facility] Group Notes (Signed)
LCSW Group Therapy Note 02/09/2017 1:15pm  Type of Therapy and Topic: Group Therapy: Feelings Around Returning Home & Establishing a Supportive Framework and Supporting Oneself When Supports Not Available  Participation Level: None  Description of Group:  Patients first processed thoughts and feelings about upcoming discharge. These included fears of upcoming changes, lack of change, new living environments, judgements and expectations from others and overall stigma of mental health issues. The group then discussed the definition of a supportive framework, what that looks and feels like, and how do to discern it from an unhealthy non-supportive network. The group identified different types of supports as well as what to do when your family/friends are less than helpful or unavailable  Therapeutic Goals  1. Patient will identify one healthy supportive network that they can use at discharge. 2. Patient will identify one factor of a supportive framework and how to tell it from an unhealthy network. 3. Patient able to identify one coping skill to use when they do not have positive supports from others. 4. Patient will demonstrate ability to communicate their needs through discussion and/or role plays.  Summary of Patient Progress:  Pt attended group but did not participate.   Therapeutic Modalities Cognitive Behavioral Therapy Motivational Interviewing   Malgorzata Albert  CUEBAS-COLON, LCSW 02/09/2017 12:22 PM    

## 2017-02-10 NOTE — Progress Notes (Signed)
Patient ID: Shane Nash, male   DOB: Aug 15, 1980, 37 y.o.   MRN: [id number]   CSW spoke with pt's sister, Shane Nash at [phone], who reported she will be able to pick pt up on Friday, 02/14/17 at 5PM. Pt's sister reported she does not get off work until [employer]4PM, so the earliest she could be here is 5PM. CSW will relay this information to the tx team.   CSW also explained to pt's sister that CSW was able to facilitate the ACT Team seeing pt again, so services will resume with [facility] upon discharge from [facility]. Pt's sister expressed understanding and agreement with this information. CSW will continue to coordinate with pt's sister as needed for updates and discharge planning.   Heidi DachKelsey Gardenia Witter, MSW, LCSW 02/10/2017 1:10 PM

## 2017-02-10 NOTE — Progress Notes (Signed)
Recreation Therapy Notes  Date: 01.14.2019  Time: 9:30 am  Location: Craft Room  Behavioral response: Appropriate  Intervention Topic: Self-Esteem  Discussion/Intervention: Group content today was focused on self-esteem. Patient defined self-esteem and where it comes form. The group described reasons self-esteem is important. Individuals stated things that impact self-esteem and positive ways to improve self-esteem. The group participated in the intervention "Collage of Me" where patients were able to create a collage of positive things that makes them who they are. Clinical Observations/Feedback:  Patient came to group and was focused on what his peers and staff had to say about self-esteem. He left group early stating he needs to rest and never returned.  Shaida Route LRT/CTRS         Dell Briner 02/10/2017 11:32 AM

## 2017-02-10 NOTE — Progress Notes (Signed)
CSW spoke with Elease HashimotoAlisha from [facility] to discuss pt's discharge plans. Elease Hashimotolisha reported that pt now has ACTT services with them, but reported that she would like for pt to stay until Tuesday, 02/18/17 in order for pt to get into a group home. CSW explained that, as pt is his own guardian and has a safe place to discharge to, it is unlikely that we will be able to keep pt inpatient until Tuesday for that reason. CSW also explained that pt is not in agreement with going to a group home, so that is not the discharge plan at this time. Elease Hashimotolisha reported that, in that case, they "will see him on Thursday." Alisha did not elaborate on this information and stated she was unsure if [facility] would be willing to provide transportation via discharge. CSW will continue to coordinate with Alisha as needed.   Heidi DachKelsey Elloise Roark, MSW, LCSW 02/10/2017 11:46 AM   CSW contacted pt's sister,Sharon Windt at [phone],to discuss pt's discharge plans. Pt's sister did not answer, so CSW left a voicemail asking her to call back ASAP. CSW will follow up as needed.   Heidi DachKelsey Noor Vidales, MSW, LCSW 02/10/2017 11:47 AM

## 2017-02-10 NOTE — Tx Team (Signed)
Interdisciplinary Treatment and Diagnostic Plan Update  02/10/2017 Time of Session: [address] MRN: [id number]  Principal Diagnosis: Disorganized schizophrenia [facility](HCC)  Secondary Diagnoses: Principal Problem:   Disorganized schizophrenia (HCC)   Current Medications:  Current Facility-Administered Medications  Medication Dose Route Frequency Provider Last Rate Last Dose  . acetaminophen (TYLENOL) tablet 650 mg  650 mg Oral Q6H PRN [name], [name], MD      . alum & mag hydroxide-simeth (MAALOX/MYLANTA) 200-200-20 MG/5ML suspension 30 mL  30 mL Oral Q4H PRN [name], [name], MD      . benztropine (COGENTIN) tablet 1 mg  1 mg Oral BID McNew, Ileene HutchinsonHolly R, MD   1 mg at 02/10/17 0815  . cloNIDine (CATAPRES) tablet 0.1 mg  0.1 mg Oral BID PRN McNew, Ileene HutchinsonHolly R, MD      . divalproex (DEPAKOTE ER) 24 hr tablet 500 mg  500 mg Oral QHS McNew, Ileene HutchinsonHolly R, MD   500 mg at 02/09/17 2103  . haloperidol (HALDOL) tablet 10 mg  10 mg Oral QHS Darliss RidgelKapur, Aarti K, MD   10 mg at 02/09/17 2103  . haloperidol (HALDOL) tablet 10 mg  10 mg Oral Daily McNew, Ileene HutchinsonHolly R, MD   10 mg at 02/10/17 0815  . haloperidol (HALDOL) tablet 5 mg  5 mg Oral Q6H PRN McNew, Ileene HutchinsonHolly R, MD      . LORazepam (ATIVAN) tablet 1 mg  1 mg Oral Q4H PRN McNew, Ileene HutchinsonHolly R, MD      . magnesium hydroxide (MILK OF MAGNESIA) suspension 30 mL  30 mL Oral Daily PRN [name], [name], MD      . nicotine (NICODERM CQ - dosed in mg/24 hours) patch 21 mg  21 mg Transdermal Daily McNew, Ileene HutchinsonHolly R, MD   21 mg at 02/10/17 0815  . sodium chloride tablet 1 g  1 g Oral TID WC [name], Jackquline DenmarkJohn T, MD   1 g at 02/10/17 0815  . traZODone (DESYREL) tablet 100 mg  100 mg Oral QHS PRN Haskell RilingMcNew, Holly R, MD   100 mg at 02/06/17 2205  . traZODone (DESYREL) tablet 50 mg  50 mg Oral QHS McNew, Ileene HutchinsonHolly R, MD   50 mg at 02/09/17 2103   PTA Medications: No medications prior to admission.    Patient Stressors: Loss of Homeless after his sister put him out  Other: Apparent non compliant with  his medications   Patient Strengths: Physical Health  Treatment Modalities: Medication Management, Group therapy, Case management,  1 to 1 session with clinician, Psychoeducation, Recreational therapy.   Physician Treatment Plan for Primary Diagnosis: Disorganized schizophrenia (HCC) Long Term Goal(s): Improvement in symptoms so as ready for discharge   Short Term Goals: Ability to identify changes in lifestyle to reduce recurrence of condition will improve Ability to demonstrate self-control will improve  Medication Management: Evaluate patient's response, side effects, and tolerance of medication regimen.  Therapeutic Interventions: 1 to 1 sessions, Unit Group sessions and Medication administration.  Evaluation of Outcomes: Progressing  Physician Treatment Plan for Secondary Diagnosis: Principal Problem:   Disorganized schizophrenia (HCC)  Long Term Goal(s): Improvement in symptoms so as ready for discharge   Short Term Goals: Ability to identify changes in lifestyle to reduce recurrence of condition will improve Ability to demonstrate self-control will improve     Medication Management: Evaluate patient's response, side effects, and tolerance of medication regimen.  Therapeutic Interventions: 1 to 1 sessions, Unit Group sessions and Medication administration.  Evaluation of Outcomes: Progressing  RN Treatment Plan for Primary Diagnosis: Disorganized schizophrenia (HCC) Long Term Goal(s): Knowledge of disease and therapeutic regimen to maintain health will improve  Short Term Goals: Ability to verbalize feelings will improve, Ability to identify and develop effective coping behaviors will improve and Compliance with prescribed medications will improve  Medication Management: RN will administer medications as ordered by provider, will assess and evaluate patient's response and provide education to patient for prescribed medication. RN will report any adverse and/or side  effects to prescribing provider.  Therapeutic Interventions: 1 on 1 counseling sessions, Psychoeducation, Medication administration, Evaluate responses to treatment, Monitor vital signs and CBGs as ordered, Perform/monitor CIWA, COWS, AIMS and Fall Risk screenings as ordered, Perform wound care treatments as ordered.  Evaluation of Outcomes: Progressing   LCSW Treatment Plan for Primary Diagnosis: Disorganized schizophrenia (HCC) Long Term Goal(s): Safe transition to appropriate next level of care at discharge, Engage patient in therapeutic group addressing interpersonal concerns.  Short Term Goals: Engage patient in aftercare planning with referrals and resources, Increase emotional regulation and Increase skills for wellness and recovery  Therapeutic Interventions: Assess for all discharge needs, 1 to 1 time with Social worker, Explore available resources and support systems, Assess for adequacy in community support network, Educate family and significant other(s) on suicide prevention, Complete Psychosocial Assessment, Interpersonal group therapy.  Evaluation of Outcomes: Progressing   Progress in Treatment: Attending groups: No. Participating in groups: No. Taking medication as prescribed: Yes. Toleration medication: Yes. Family/Significant other contact made: No, will contact:  pt's sister. Patient understands diagnosis: Yes. Discussing patient identified problems/goals with staff: Yes. Medical problems stabilized or resolved: Yes. Denies suicidal/homicidal ideation: Yes. Issues/concerns per patient self-inventory: No. Other: None at this time.   New problem(s) identified: No, Describe:  None at this time.   New Short Term/Long Term Goal(s): Pt reported his goal for treatment is, "to find a job and stuff like that."   Discharge Plan or Barriers: CSW continues to assess for an appropriate discharge plan. CSW has made an ACTT referral to [facility] to ensure continued stability while in  the community. Pt will also be returning home to live with his sister upon discharge.   Reason for Continuation of Hospitalization: Delusions  Hallucinations Medication stabilization  Estimated Length of Stay: 3-4 days  Recreational Therapy: Patient Stressors: Family (My uncle brought me here after I got out of jail.) Patient Goal: Patient will attend and participate in Recreation Therapy Group Sessions x5 days   Attendees: Patient: 02/10/2017 11:16 AM  Physician: Dr. Johnella Moloney, MD 02/10/2017 11:16 AM  Nursing: Cecille Amsterdam, RN 02/10/2017 11:16 AM  RN Care Manager:  02/10/2017 11:16 AM  Social Worker: Heidi Dach, LCSW 02/10/2017 11:16 AM  Recreational Therapist: Garret Reddish, CTRS-LTR 02/10/2017 11:16 AM  Other: Johny Shears, LCSWA 02/10/2017 11:16 AM  Other:  02/10/2017 11:16 AM  Other: 02/10/2017 11:16 AM    Scribe for Treatment Team: Heidi Dach, LCSW 02/10/2017 11:24 AM

## 2017-02-10 NOTE — Progress Notes (Signed)
[facility] MD Progress Note  02/10/2017 3:03 PM Shane Nash  MRN:  [id number]   Subjective:  Pt has much better hygiene today. He showered over the weekend. He was observed by this writer to be out in the milieu more today which is a significant improvement. Eye contact better today and speech is much clearer. He is still concrete in thinking but more verbal than on Friday. He has less movements with hands and does not appear to be responding to internal stimuli. He denies hearing voices expect, "just what I hear in my ears." Denies SI, HI. Discussed possibility of group home. He is able to state " I don't want to go to a group home. I want to get my own aapartmentand live by myself." DDiscussedwith him that group home could be a stepping stone to getting his own place but he continues to decline this option. He is open to going back to his sister's house. He is able to state that he is on Haldol and feels it is helpful although he is not sure what it helps with. He is oriented to date, place, situation, ppresidentand remembers this providers name.   Principal Problem: Disorganized schizophrenia (HCC) Diagnosis:   Patient Active Problem List   Diagnosis Date Noted  . 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 Schizophrenia (HCC) [F20.9] 01/27/2017  . Hyponatremia [E87.1] 01/27/2017  . Polydipsia [R63.1] 01/27/2017   Total Time spent with patient: 20 minutes  Past Psychiatric History: See H&P  Past Medical History:  Past Medical History:  Diagnosis Date  . Schizophrenia (HCC)    History reviewed. No pertinent surgical history. Family History: History reviewed. No pertinent family history. Family Psychiatric  History: See H&P Social History:  Social History   Substance and Sexual Activity  Alcohol Use Not on file     Social History   Substance and Sexual Activity  Drug Use No    Social History   Socioeconomic History  . Marital status: Single   Spouse name: None  . Number of children: None  . Years of education: None  . Highest education level: None  Social Needs  . Financial resource strain: None  . Food insecurity - worry: None  . Food insecurity - inability: None  . Transportation needs - medical: None  . Transportation needs - non-medical: None  Occupational History  . None  Tobacco Use  . Smoking status: Current Every Day Smoker    Packs/day: 0.25    Years: 15.00    Pack years: 3.75    Types: Cigarettes  . Smokeless tobacco: Never Used  . Tobacco comment: Cannot state the amount he smokes per day  Substance and Sexual Activity  . Alcohol use: None  . Drug use: No  . Sexual activity: Not Currently  Other Topics Concern  . None  Social History Narrative  . None   Additional Social History:                         Sleep: Good  Appetite:  Good  Current Medications: Current Facility-Administered Medications  Medication Dose Route Frequency Provider Last Rate Last Dose  . acetaminophen (TYLENOL) tablet 650 mg  650 mg Oral Q6H PRN [name], [name], MD      . alum & mag hydroxide-simeth (MAALOX/MYLANTA) 200-200-20 MG/5ML suspension 30 mL  30 mL Oral Q4H PRN [name], Jackquline DenmarkJohn T, MD      . benztropine (COGENTIN) tablet  1 mg  1 mg Oral BID Haskell Riling, MD   1 mg at 02/10/17 0815  . cloNIDine (CATAPRES) tablet 0.1 mg  0.1 mg Oral BID PRN Tekoa Amon, Ileene Hutchinson, MD      . divalproex (DEPAKOTE ER) 24 hr tablet 500 mg  500 mg Oral QHS Flemon Kelty, Ileene Hutchinson, MD   500 mg at 02/09/17 2103  . haloperidol (HALDOL) tablet 10 mg  10 mg Oral QHS Darliss Ridgel, MD   10 mg at 02/09/17 2103  . haloperidol (HALDOL) tablet 10 mg  10 mg Oral Daily Andree Golphin, Ileene Hutchinson, MD   10 mg at 02/10/17 0815  . haloperidol (HALDOL) tablet 5 mg  5 mg Oral Q6H PRN Lakrisha Iseman, Ileene Hutchinson, MD      . LORazepam (ATIVAN) tablet 1 mg  1 mg Oral Q4H PRN Hurschel Paynter, Ileene Hutchinson, MD      . magnesium hydroxide (MILK OF MAGNESIA) suspension 30 mL  30 mL Oral Daily PRN [name], [name],  MD      . nicotine (NICODERM CQ - dosed in mg/24 hours) patch 21 mg  21 mg Transdermal Daily Jlyn Bracamonte, Ileene Hutchinson, MD   21 mg at 02/10/17 0815  . sodium chloride tablet 1 g  1 g Oral TID WC [name], [name], MD   1 g at 02/10/17 1203  . traZODone (DESYREL) tablet 100 mg  100 mg Oral QHS PRN Haskell Riling, MD   100 mg at 02/06/17 2205  . traZODone (DESYREL) tablet 50 mg  50 mg Oral QHS Bonnetta Allbee, Ileene Hutchinson, MD   50 mg at 02/09/17 2103    Lab Results: No results found for this or any previous visit (from the past 48 hour(s)).  Blood Alcohol level:  Lab Results  Component Value Date   ETH <10 01/27/2017    Metabolic Disorder Labs: Lab Results  Component Value Date   HGBA1C 5.2 01/31/2017   MPG 102.54 01/31/2017   No results found for: PROLACTIN Lab Results  Component Value Date   CHOL 160 01/31/2017   TRIG 95 01/31/2017   HDL 39 (L) 01/31/2017   CHOLHDL 4.1 01/31/2017   VLDL 19 01/31/2017   LDLCALC 102 (H) 01/31/2017    Physical Findings: AIMS: Facial and Oral Movements Muscles of Facial Expression: None, normal Lips and Perioral Area: None, normal Jaw: None, normal Tongue: None, normal,Extremity Movements Upper (arms, wrists, hands, fingers): Mild Lower (legs, knees, ankles, toes): None, normal, Trunk Movements Neck, shoulders, hips: None, normal, Overall Severity Severity of abnormal movements (highest score from questions above): None, normal Incapacitation due to abnormal movements: None, normal Patient's awareness of abnormal movements (rate only patient's report): No Awareness, Dental Status Current problems with teeth and/or dentures?: No Does patient usually wear dentures?: No  CIWA:    COWS:     Musculoskeletal: Strength & Muscle Tone: within normal limits Gait & Station: normal Patient leans: N/A  Psychiatric Specialty Exam: Physical Exam  Nursing note and vitals reviewed. No muscle stiffness or cogwheeling noted on physical exam  Review of Systems  All other  systems reviewed and are negative.   Blood pressure 95/71, pulse 99, temperature 97.9 F (36.6 C), temperature source Oral, resp. rate 16, height 5' 9.49" (1.765 m), weight 76.2 kg (168 lb), SpO2 99 %.Body mass index is 24.46 kg/m.  General Appearance: Casual, better hygiene  Eye Contact:  Minimal but improving  Speech:  Clear and Coherent  Volume:  Normal  Mood:  Euthymic  Affect:  Flat  Thought Process:  Coherent and Goal Directed  Orientation:  Full (Time, Place, and Person)  Thought Content:  Logical  Suicidal Thoughts:  No  Homicidal Thoughts:  No  Memory:  Immediate;   Fair  Judgement:  Impaired  Insight:  Lacking  Psychomotor Activity:  Normal  Concentration:  Concentration: Fair  Recall:  [employer] of Knowledge:  Fair  Language:  Fair  Akathisia:  No      Assets:  Resilience  ADL's:  Intact  Cognition:  WNL  Sleep:  Number of Hours: 6     Treatment Plan Summary: 37 yo male admitted due to decompensation of schizophrenia in the context of medication non compliance. He is still very concrete in thoughts but improving significantly. He has better hygiene and out on the unit more. He is more organized and goal directed.   Plan:  Schizophrenia -Continue Haldol 10 mg BID. He was given Haldol dec 100 mg last week and will get second 100 mg dose on Wednesday. He is tolerating current dose with no side effects. -Continue Depakote 500 mg qhs -Cogentin 1 mg BID -Monitor self care. Encouraged to do laundry this week  Dispo -Discussed option of group home but pt declines this. He is his own guardian at this time. He will follow up with ACT team  Haskell Riling, MD 02/10/2017, 3:03 PM

## 2017-02-10 NOTE — [facility] Group Notes (Signed)
[facility] Group Notes:  (Nursing/MHT/Case Management/Adjunct)  Date:  02/10/2017  Time:  4:22 AM  Type of Therapy:  Group Therapy  Participation Level:  Active  Participation Quality:  Appropriate  Affect:  Appropriate  Cognitive:  Appropriate  Insight:  Appropriate  Engagement in Group:  Engaged  Modes of Intervention:  Discussion  Summary of Progress/Problems:  Shane Nash 02/10/2017, 4:22 AM

## 2017-02-10 NOTE — [facility] Group Notes (Signed)
02/10/2017 1PM  Type of Therapy and Topic:  Group Therapy:  Overcoming Obstacles  Participation Level:  Did Not Attend    Description of Group:    In this group patients will be encouraged to explore what they see as obstacles to their own wellness and recovery. They will be guided to discuss their thoughts, feelings, and behaviors related to these obstacles. The group will process together ways to cope with barriers, with attention given to specific choices patients can make. Each patient will be challenged to identify changes they are motivated to make in order to overcome their obstacles. This group will be process-oriented, with patients participating in exploration of their own experiences as well as giving and receiving support and challenge from other group members.   Therapeutic Goals: 1. Patient will identify personal and current obstacles as they relate to admission. 2. Patient will identify barriers that currently interfere with their wellness or overcoming obstacles.  3. Patient will identify feelings, thought process and behaviors related to these barriers. 4. Patient will identify two changes they are willing to make to overcome these obstacles:      Summary of Patient Progress Patient was encouraged and invited to attend group. Patient did not attend group. Social worker will continue to encourage group participation in the future.     Therapeutic Modalities:   Cognitive Behavioral Therapy Solution Focused Therapy Motivational Interviewing Relapse Prevention Therapy    Johny ShearsCassandra Kristopher Delk MSW, LCSWA 02/10/2017 1:51 PM

## 2017-02-10 NOTE — [facility] Group Notes (Signed)
[facility] Group Notes:  (Nursing/MHT/Case Management/Adjunct)  Date:  02/10/2017  Time:  9:55 PM  Type of Therapy:  Group Therapy  Participation Level:  Did Not Attend  ms:  Mayra NeerJackie L Mohit Zirbes 02/10/2017, 9:55 PM

## 2017-02-10 NOTE — Plan of Care (Signed)
Patient is alert today, denies SI, HI and states he is not hearing voices in his head, just his ears. Patient is cooperative and medication compliant. Patient is now attending groups and is appropriate while in group. Patient still is isolative towards peers, does not interact with others in the milieu. Safety checks will continue Q 15 minutes. Nurse will continue to monitor. Activity: Will identify at least one activity in which they can participate 02/10/2017 1047 - Progressing by Leamon ArntPowell, Phiona Ramnauth K, RN   Coping: Ability to identify and develop effective coping behavior will improve 02/10/2017 1047 - Progressing by Leamon ArntPowell, Barbette Mcglaun K, RN Ability to interact with others will improve 02/10/2017 1047 - Progressing by Leamon ArntPowell, Zavion Sleight K, RN Participation in decision-making will improve 02/10/2017 1047 - Progressing by Leamon ArntPowell, Kanon Novosel K, RN Ability to use eye contact when communicating with others will improve 02/10/2017 1047 - Progressing by Leamon ArntPowell, Tiphani Mells K, RN   Coping: Ability to interact with others will improve 02/10/2017 1047 - Progressing by Leamon ArntPowell, Tomer Chalmers K, RN   Health Behavior/Discharge Planning: Identification of resources available to assist in meeting health care needs will improve 02/10/2017 1047 - Progressing by Leamon ArntPowell, Syona Wroblewski K, RN

## 2017-02-11 NOTE — [facility] Group Notes (Signed)
02/11/2017 1PM  Type of Therapy/Topic:  Group Therapy:  Feelings about Diagnosis  Participation Level:  Did Not Attend   Description of Group:   This group will allow patients to explore their thoughts and feelings about diagnoses they have received. Patients will be guided to explore their level of understanding and acceptance of these diagnoses. Facilitator will encourage patients to process their thoughts and feelings about the reactions of others to their diagnosis and will guide patients in identifying ways to discuss their diagnosis with significant others in their lives. This group will be process-oriented, with patients participating in exploration of their own experiences, giving and receiving support, and processing challenge from other group members.   Therapeutic Goals: 1. Patient will demonstrate understanding of diagnosis as evidenced by identifying two or more symptoms of the disorder 2. Patient will be able to express two feelings regarding the diagnosis 3. Patient will demonstrate their ability to communicate their needs through discussion and/or role play  Summary of Patient Progress: Pt was invited to attend group but chose not to attend. CSW will continue to encourage pt to attend group throughout their admission.   Therapeutic Modalities:   Cognitive Behavioral Therapy Brief Therapy  Taydem Cavagnaro, MSW, LCSW 02/11/2017 1:34 PM  

## 2017-02-11 NOTE — Progress Notes (Signed)
Recreation Therapy Notes  Date: 01.15.2019  Time: 9:30 am  Location: Craft Room  Behavioral response: N/A  Intervention Topic: Coping skills  Discussion/Intervention: Patient did not attend group. Clinical Observations/Feedback:  Patient did not attend group. Shane Nash LRT/CTRS          [name] 02/11/2017 10:48 AM 

## 2017-02-11 NOTE — Plan of Care (Signed)
Limited thought put into coping skills , thought process remains altered . Patient able to  identify ways of coping.limited interaction with peers and staff . Very concrete  with thinking . Progressing Activity: Will identify at least one activity in which they can participate 02/11/2017 1556 - Progressing by Crist InfanteFarrish, Jinnie Onley A, RN Coping: Ability to identify and develop effective coping behavior will improve 02/11/2017 1556 - Progressing by Crist InfanteFarrish, Lantz Hermann A, RN Ability to interact with others will improve 02/11/2017 1556 - Progressing by Crist InfanteFarrish, Castle Lamons A, RN Participation in decision-making will improve 02/11/2017 1556 - Progressing by Crist InfanteFarrish, Nola Botkins A, RN Ability to use eye contact when communicating with others will improve 02/11/2017 1556 - Progressing by Crist InfanteFarrish, Jaskiran Pata A, RN Health Behavior/Discharge Planning: Identification of resources available to assist in meeting health care needs will improve 02/11/2017 1556 - Progressing by Crist InfanteFarrish, Alissah Redmon A, RN Self-Concept: Ability to verbalize positive feelings about self will improve 02/11/2017 1556 - Progressing by Crist InfanteFarrish, Cowen Pesqueira A, RN Activity: Sleeping patterns will improve 02/11/2017 1556 - Progressing by Crist InfanteFarrish, Yossi Hinchman A, RN

## 2017-02-11 NOTE — Progress Notes (Signed)
[facility] MD Progress Note  02/11/2017 12:56 PM Shane Nash  MRN:  [id number] Subjective:  Pt was seen out of his room eating lunch with peers. He still mostly isolates to his room but is seen out more often. Speech is much clearer than on admission. He is still slightly disheveled but hygiene is improved since admission. HE did shower over the weekend and he was encouraged to shower again this week which he agreed to. He has not done laundry on the unit. He has much better eye contact. HE is oriented to date and place. He is looking forward to discharge soon. Denies AH or VH. Less odd movements with hands. Denies SI or HI. Discussed that he will get second injection of Haldol tomorrow which he is in agreement with.   Principal Problem: Disorganized schizophrenia (HCC) Diagnosis:   Patient Active Problem List   Diagnosis Date Noted  . 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 Schizophrenia (HCC) [F20.9] 01/27/2017  . Hyponatremia [E87.1] 01/27/2017  . Polydipsia [R63.1] 01/27/2017   Total Time spent with patient: 15 minutes  Past Psychiatric History: See H&P  Past Medical History:  Past Medical History:  Diagnosis Date  . Schizophrenia (HCC)    History reviewed. No pertinent surgical history. Family History: History reviewed. No pertinent family history. Family Psychiatric  History: See H&P Social History:  Social History   Substance and Sexual Activity  Alcohol Use Not on file     Social History   Substance and Sexual Activity  Drug Use No    Social History   Socioeconomic History  . Marital status: Single    Spouse name: None  . Number of children: None  . Years of education: None  . Highest education level: None  Social Needs  . Financial resource strain: None  . Food insecurity - worry: None  . Food insecurity - inability: None  . Transportation needs - medical: None  . Transportation needs - non-medical: None  Occupational History   . None  Tobacco Use  . Smoking status: Current Every Day Smoker    Packs/day: 0.25    Years: 15.00    Pack years: 3.75    Types: Cigarettes  . Smokeless tobacco: Never Used  . Tobacco comment: Cannot state the amount he smokes per day  Substance and Sexual Activity  . Alcohol use: None  . Drug use: No  . Sexual activity: Not Currently  Other Topics Concern  . None  Social History Narrative  . None   Additional Social History:                         Sleep: Good  Appetite:  Good  Current Medications: Current Facility-Administered Medications  Medication Dose Route Frequency Provider Last Rate Last Dose  . acetaminophen (TYLENOL) tablet 650 mg  650 mg Oral Q6H PRN [name], [name], MD      . alum & mag hydroxide-simeth (MAALOX/MYLANTA) 200-200-20 MG/5ML suspension 30 mL  30 mL Oral Q4H PRN [name], [name], MD      . benztropine (COGENTIN) tablet 1 mg  1 mg Oral BID McNew, Ileene Hutchinson, MD   1 mg at 02/11/17 0741  . cloNIDine (CATAPRES) tablet 0.1 mg  0.1 mg Oral BID PRN McNew, Ileene Hutchinson, MD      . divalproex (DEPAKOTE ER) 24 hr tablet 500 mg  500 mg Oral QHS McNew, Ileene Hutchinson, MD   500 mg  at 02/10/17 2157  . haloperidol (HALDOL) tablet 10 mg  10 mg Oral QHS Darliss RidgelKapur, Aarti K, MD   10 mg at 02/10/17 2156  . haloperidol (HALDOL) tablet 10 mg  10 mg Oral Daily McNew, Ileene HutchinsonHolly R, MD   10 mg at 02/11/17 0741  . haloperidol (HALDOL) tablet 5 mg  5 mg Oral Q6H PRN McNew, Ileene HutchinsonHolly R, MD      . LORazepam (ATIVAN) tablet 1 mg  1 mg Oral Q4H PRN McNew, Ileene HutchinsonHolly R, MD      . magnesium hydroxide (MILK OF MAGNESIA) suspension 30 mL  30 mL Oral Daily PRN [name], [name], MD      . nicotine (NICODERM CQ - dosed in mg/24 hours) patch 21 mg  21 mg Transdermal Daily McNew, Ileene HutchinsonHolly R, MD   21 mg at 02/10/17 0815  . sodium chloride tablet 1 g  1 g Oral TID WC [name], Jackquline DenmarkJohn T, MD   1 g at 02/11/17 1159  . traZODone (DESYREL) tablet 100 mg  100 mg Oral QHS PRN Haskell RilingMcNew, Holly R, MD   100 mg at 02/06/17 2205  .  traZODone (DESYREL) tablet 50 mg  50 mg Oral QHS McNew, Ileene HutchinsonHolly R, MD   50 mg at 02/10/17 2157    Lab Results: No results found for this or any previous visit (from the past 48 hour(s)).  Blood Alcohol level:  Lab Results  Component Value Date   ETH <10 01/27/2017    Metabolic Disorder Labs: Lab Results  Component Value Date   HGBA1C 5.2 01/31/2017   MPG 102.54 01/31/2017   No results found for: PROLACTIN Lab Results  Component Value Date   CHOL 160 01/31/2017   TRIG 95 01/31/2017   HDL 39 (L) 01/31/2017   CHOLHDL 4.1 01/31/2017   VLDL 19 01/31/2017   LDLCALC 102 (H) 01/31/2017    Physical Findings: AIMS: Facial and Oral Movements Muscles of Facial Expression: None, normal Lips and Perioral Area: None, normal Jaw: None, normal Tongue: None, normal,Extremity Movements Upper (arms, wrists, hands, fingers): Minimal Lower (legs, knees, ankles, toes): None, normal, Trunk Movements Neck, shoulders, hips: None, normal, Overall Severity Severity of abnormal movements (highest score from questions above): None, normal Incapacitation due to abnormal movements: None, normal Patient's awareness of abnormal movements (rate only patient's report): No Awareness, Dental Status Current problems with teeth and/or dentures?: No Does patient usually wear dentures?: No  CIWA:    COWS:     Musculoskeletal: Strength & Muscle Tone: within normal limits Gait & Station: normal Patient leans: N/A  Psychiatric Specialty Exam: Physical Exam  Nursing note and vitals reviewed. NO dystonia, muscle stiffness or cogwheeling noted on BUE and BLE  Review of Systems  All other systems reviewed and are negative.   Blood pressure 120/81, pulse 85, temperature 98.3 F (36.8 C), temperature source Oral, resp. rate 18, height 5' 9.49" (1.765 m), weight 76.2 kg (168 lb), SpO2 98 %.Body mass index is 24.46 kg/m.  General Appearance: Casual, disheveled but improved hygiene  Eye Contact:  Minimal but  improved  Speech:  Clear and Coherent  Volume:  Decreased  Mood:  Euthymic  Affect:  Flat  Thought Process:  Concrete  Orientation:  Full (Time, Place, and Person)  Thought Content:  Logical  Suicidal Thoughts:  No  Homicidal Thoughts:  No  Memory:  Immediate;   Fair  Judgement:  Fair  Insight:  Fair  Psychomotor Activity:  Normal  Concentration:  Concentration: Fair  Recall:  Fair  Fund of Knowledge:  Fair  Language:  Fair  Akathisia:  No      Assets:  Resilience Social Support  ADL's:  Intact  Cognition:  WNL  Sleep:  Number of Hours: 6     Treatment Plan Summary: 37 yo male admitted due to psychosis and decompensation in the setting of hyponatremia and medication noncompliance. He has improved and much more organized although very concrete in conversation. Hygiene is slightly better and he did shower over the weekend. He is nearing his baseline functioning which is overall low functioning. He will follow with ACT team. HE declines going to a group home.   Plan:  Schizophrenia -Continue Haldol 10 mg BID. He was given Haldol dec 100 mg last week. Will give second 100 mg dose tomorrow. He is tolerating this dose well.  -Continue Depakote 500 mg qhs -Cogentin 1 mg BID  Dispo -He will discharge to his sisters house. Tentative plan for discharge on Friday.   Haskell Riling, MD 02/11/2017, 12:56 PM

## 2017-02-11 NOTE — Progress Notes (Signed)
Patient ID: Shane Nash, male   DOB: 1980/04/06, 37 y.o.   MRN: [id number] Disheveled still, less disorganized in thoughts and processes, stand-alone, minimal interaction with peers, out of room but mostly in the hallway, in and out of day room and back to his room; "I will be going to my sister tomorrow..." Medication compliant, denied SI/HI/AVH.

## 2017-02-11 NOTE — Progress Notes (Signed)
D:Continue to isolate himself  From  His peers . Appetite good .  And voice no concerns around sleep Limited thought put into coping skills , thought process remains altered . Patient able to  identify ways of coping.limited interaction with peers and staff . Concrete  with thinking . Encourage bath  auditory hallucinations  No pain concerns . A: Encourage patient participation with unit programming . Instruction  Given on  Medication , verbalize understanding. R: Voice no other concerns. Staff continue to monitor

## 2017-02-11 NOTE — Plan of Care (Signed)
Patient slept for Estimated Hours of 6; Precautionary checks every 15 minutes for safety maintained, room free of safety hazards, patient sustains no injury or falls during this shift.  

## 2017-02-12 MED ORDER — HALOPERIDOL DECANOATE 100 MG/ML IM SOLN
50.0000 mg | Freq: Once | INTRAMUSCULAR | Status: AC
  Administered 2017-02-12: 50 mg via INTRAMUSCULAR
  Filled 2017-02-12: qty 0.5

## 2017-02-12 MED ORDER — HALOPERIDOL 5 MG PO TABS
5.0000 mg | ORAL_TABLET | Freq: Every day | ORAL | Status: DC
  Administered 2017-02-13 – 2017-02-14 (×2): 5 mg via ORAL
  Filled 2017-02-12 (×2): qty 1

## 2017-02-12 NOTE — Plan of Care (Signed)
Patient slept for Estimated Hours of 7.45; Precautionary checks every 15 minutes for safety maintained, room free of safety hazards, patient sustains no injury or falls during this shift.  

## 2017-02-12 NOTE — Progress Notes (Addendum)
[facility]  02/12/2017 11:58 AM Shane Nash  MRN:  [id number] Subjective:  Pt states that he is doing well. He is doing laundry today which I asked of him to do yesterday which is a significant improvement. He denies AH, VH. He was out of his room eating meals. He is smiling more today when talking about discharge. He states that he is excited to go home on Friday. He denies SI or HI. Speech is clearer. He does have very mild muscle stiffness in his BUE that I noticed on exam. No cogwheeling. Will lower dose of Haldol slightly. He was still doing much better on 15 total mg of Haldol. Will given additional 50 mg of Haldol Decanoate today for total dose of 150 mg monthly.   Principal Problem: Disorganized schizophrenia (HCC) Diagnosis:   Patient Active Problem List   Diagnosis Date Noted  . 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 Schizophrenia (HCC) [F20.9] 01/27/2017  . Hyponatremia [E87.1] 01/27/2017  . Polydipsia [R63.1] 01/27/2017   Total Time spent with patient: 20 minutes  Past Psychiatric History: See H&P  Past Medical History:  Past Medical History:  Diagnosis Date  . Schizophrenia (HCC)    History reviewed. No pertinent surgical history. Family History: History reviewed. No pertinent family history. Family Psychiatric  History: See H&P Social History:  Social History   Substance and Sexual Activity  Alcohol Use Not on file     Social History   Substance and Sexual Activity  Drug Use No    Social History   Socioeconomic History  . Marital status: Single    Spouse name: None  . Number of children: None  . Years of education: None  . Highest education level: None  Social Needs  . Financial resource strain: None  . Food insecurity - worry: None  . Food insecurity - inability: None  . Transportation needs - medical: None  . Transportation needs - non-medical: None  Occupational History  . None  Tobacco Use  .  Smoking status: Current Every Day Smoker    Packs/day: 0.25    Years: 15.00    Pack years: 3.75    Types: Cigarettes  . Smokeless tobacco: Never Used  . Tobacco comment: Cannot state the amount he smokes per day  Substance and Sexual Activity  . Alcohol use: None  . Drug use: No  . Sexual activity: Not Currently  Other Topics Concern  . None  Social History Narrative  . None   Additional Social History:                         Sleep: Good-much improved  Appetite:  Good  Current Medications: Current Facility-Administered Medications  Medication Dose Route Frequency Provider Last Rate Last Dose  . acetaminophen (TYLENOL) tablet 650 mg  650 mg Oral Q6H PRN [name], [name], MD      . alum & mag hydroxide-simeth (MAALOX/MYLANTA) 200-200-20 MG/5ML suspension 30 mL  30 mL Oral Q4H PRN [name], [name], MD      . benztropine (COGENTIN) tablet 1 mg  1 mg Oral BID Katleen Carraway, Ileene HutchinsonHolly R, MD   1 mg at 02/12/17 0806  . cloNIDine (CATAPRES) tablet 0.1 mg  0.1 mg Oral BID PRN Tabithia Stroder, Ileene HutchinsonHolly R, MD      . divalproex (DEPAKOTE ER) 24 hr tablet 500 mg  500 mg Oral QHS Juliza Machnik, Ileene HutchinsonHolly R, MD   500 mg at  02/11/17 2136  . haloperidol (HALDOL) tablet 10 mg  10 mg Oral QHS Darliss Ridgel, MD   10 mg at 02/11/17 2136  . haloperidol (HALDOL) tablet 5 mg  5 mg Oral Q6H PRN Haskell Riling, MD      . Melene Muller ON 02/13/2017] haloperidol (HALDOL) tablet 5 mg  5 mg Oral Daily Eusebio Blazejewski, Ileene Hutchinson, MD      . haloperidol decanoate (HALDOL DECANOATE) 100 MG/ML injection 50 mg  50 mg Intramuscular Once Bronson Bressman, Ileene Hutchinson, MD      . LORazepam (ATIVAN) tablet 1 mg  1 mg Oral Q4H PRN Nakoma Gotwalt, Ileene Hutchinson, MD      . magnesium hydroxide (MILK OF MAGNESIA) suspension 30 mL  30 mL Oral Daily PRN [name], [name], MD      . nicotine (NICODERM CQ - dosed in mg/24 hours) patch 21 mg  21 mg Transdermal Daily Evangelos Paulino, Ileene Hutchinson, MD   21 mg at 02/10/17 0815  . sodium chloride tablet 1 g  1 g Oral TID WC [name], [name], MD   1 g at 02/12/17 0806  .  traZODone (DESYREL) tablet 100 mg  100 mg Oral QHS PRN Haskell Riling, MD   100 mg at 02/06/17 2205  . traZODone (DESYREL) tablet 50 mg  50 mg Oral QHS Oluwatosin Higginson, Ileene Hutchinson, MD   50 mg at 02/11/17 2136    Lab Results: No results found for this or any previous visit (from the past 48 hour(s)).  Blood Alcohol level:  Lab Results  Component Value Date   ETH <10 01/27/2017    Metabolic Disorder Labs: Lab Results  Component Value Date   HGBA1C 5.2 01/31/2017   MPG 102.54 01/31/2017   No results found for: PROLACTIN Lab Results  Component Value Date   CHOL 160 01/31/2017   TRIG 95 01/31/2017   HDL 39 (L) 01/31/2017   CHOLHDL 4.1 01/31/2017   VLDL 19 01/31/2017   LDLCALC 102 (H) 01/31/2017    Physical Findings: AIMS: Facial and Oral Movements Muscles of Facial Expression: None, normal Lips and Perioral Area: None, normal Jaw: None, normal Tongue: None, normal,Extremity Movements Upper (arms, wrists, hands, fingers): Minimal Lower (legs, knees, ankles, toes): None, normal, Trunk Movements Neck, shoulders, hips: None, normal, Overall Severity Severity of abnormal movements (highest score from questions above): None, normal Incapacitation due to abnormal movements: None, normal Patient's awareness of abnormal movements (rate only patient's report): No Awareness, Dental Status Current problems with teeth and/or dentures?: No Does patient usually wear dentures?: No  CIWA:    COWS:     Musculoskeletal: Strength & Muscle Tone: within normal limits Gait & Station: normal Patient leans: N/A  Psychiatric Specialty Exam: Physical Exam  Nursing note and vitals reviewed. Very mild muscle stiffness on RUE>LUE  Review of Systems  All other systems reviewed and are negative.   Blood pressure 121/78, pulse 79, temperature 98.1 F (36.7 C), temperature source Oral, resp. rate 18, height 5' 9.49" (1.765 m), weight 76.2 kg (168 lb), SpO2 99 %.Body mass index is 24.46 kg/m.  General  Appearance: Casual, hygiene improved, doing laundry today  Eye Contact:  Fair-improved since admission  Speech:  Clear and Coherent  Volume:  Normal  Mood:  Euthymic  Affect:  Brighter, smiling today which is a big improvement  Thought Process:  Coherent and Goal Directed but concrete  Orientation:  Full (Time, Place, and Person)  Thought Content:  Logical  Suicidal Thoughts:  No  Homicidal Thoughts:  No  Memory:  Immediate;   Fair  Judgement:  Fair  Insight:  Fair  Psychomotor Activity:  Normal  Concentration:  Concentration: Fair  Recall:  [employer] of Knowledge:  Fair  Language:  Fair  Akathisia:  No      Assets:  Communication Skills Desire for Improvement Resilience  ADL's:  Improved  Cognition:  WNL  Sleep:  Number of Hours: 7.45     Treatment Plan Summary: 38 yo male admitted due to decompensation of psychosis. He is improving. Hygiene is better. He appears to be very close to baseline. I did notice some very mild muscle stiffness on exam today. Will decrease oral dose to total of 15 mg daily. He was still doing much better on this dose. Will give additional 50 mg Haldol Dec injection today so will be on total of 150 mg monthly injections. He will have to continue oral Haldol for a few weeks.   Plan:  Schizophrenia -Decrease oral Haldol to 5 mg and 10 mg qhs -Will given Haldol Dec 50 mg today. Next injection will be Haldol Dec 150 mg 03/06/17 -Cogentin 1 mg BID -Continue Depakote 500 mg qhs  Dispo -Discharge Friday to [facility] house. He will follow up with ACT team Haskell Riling, MD 02/12/2017, 11:58 AM

## 2017-02-12 NOTE — Progress Notes (Signed)
Patient ID: Shane Nash, male   DOB: Oct 08, 1980, 37 y.o.   MRN: [id number] Avoidant, comfortable to self and with himself, no thoughts of self harm or harming anyone, he appears to be at his baseline; disheveled still, "I will be going home to my sister on Friday, [place] [employer]

## 2017-02-12 NOTE — [facility] Group Notes (Signed)
02/12/2017 9:30AM  Type of Therapy/Topic:  Group Therapy:  Emotion Regulation  Participation Level:  Did Not Attend   Description of Group:   The purpose of this group is to assist patients in learning to regulate negative emotions and experience positive emotions. Patients will be guided to discuss ways in which they have been vulnerable to their negative emotions. These vulnerabilities will be juxtaposed with experiences of positive emotions or situations, and patients will be challenged to use positive emotions to combat negative ones. Special emphasis will be placed on coping with negative emotions in conflict situations, and patients will process healthy conflict resolution skills.  Therapeutic Goals: 1. Patient will identify two positive emotions or experiences to reflect on in order to balance out negative emotions 2. Patient will label two or more emotions that they find the most difficult to experience 3. Patient will demonstrate positive conflict resolution skills through discussion and/or role plays  Summary of Patient Progress: Patient was encouraged and invited to attend group. Patient did not attend group. Social worker will continue to encourage group participation in the future.       Therapeutic Modalities:   Cognitive Behavioral Therapy Feelings Identification Dialectical Behavioral Therapy   Charlotte Brafford, LCSW 02/12/2017 10:22 AM 

## 2017-02-12 NOTE — Progress Notes (Signed)
D:Patient has difficulty with social programing . Isolates to his room . Continue to work on Pharmacologistcoping skills . Limited interaction with his peers  and staff . Decision  making skills   noted  with improvement. Remains to have limited eye contact . Compliant  with medication Stated he took a wash off last night , hair unkempt .  A: Encourage patient participation with unit programming . Instruction  Given on  Medication , verbalize understanding.  R: Voice no other concerns. Staff continue to monitor

## 2017-02-12 NOTE — Plan of Care (Signed)
Patient has difficulty with social programing . Isolates to his room . Continue to work on Pharmacologistcoping skills . Limited interaction with his peers  and staff . Decision  making skills   noted  with improvement. Remains to have limited eye contact . Compliant  with medication  Progressing Activity: Will identify at least one activity in which they can participate 02/12/2017 1219 - Progressing by Crist InfanteFarrish, Estefan Pattison A, RN Coping: Ability to identify and develop effective coping behavior will improve 02/12/2017 1219 - Progressing by Crist InfanteFarrish, Sulo Janczak A, RN Ability to interact with others will improve 02/12/2017 1219 - Progressing by Crist InfanteFarrish, Oshua Mcconaha A, RN Participation in decision-making will improve 02/12/2017 1219 - Progressing by Crist InfanteFarrish, Tanetta Fuhriman A, RN Ability to use eye contact when communicating with others will improve 02/12/2017 1219 - Progressing by Crist InfanteFarrish, Rael Yo A, RN Health Behavior/Discharge Planning: Identification of resources available to assist in meeting health care needs will improve 02/12/2017 1219 - Progressing by Crist InfanteFarrish, Narek Kniss A, RN Self-Concept: Ability to verbalize positive feelings about self will improve 02/12/2017 1219 - Progressing by Crist InfanteFarrish, Yosiah Jasmin A, RN Activity: Sleeping patterns will improve 02/12/2017 1219 - Progressing by Crist InfanteFarrish, Dewitt Judice A, RN

## 2017-02-13 MED ORDER — BENZTROPINE MESYLATE 1 MG PO TABS: 1.0000 mg | ORAL_TABLET | Freq: Two times a day (BID) | ORAL | 0 refills | Status: DC

## 2017-02-13 MED ORDER — HALOPERIDOL 5 MG PO TABS: 5.0000 mg | ORAL_TABLET | ORAL | 0 refills | Status: DC

## 2017-02-13 MED ORDER — DIVALPROEX SODIUM ER 500 MG PO TB24: 500.0000 mg | ORAL_TABLET | Freq: Every day | ORAL | 0 refills | Status: DC

## 2017-02-13 MED ORDER — HALOPERIDOL 5 MG PO TABS
5.0000 mg | ORAL_TABLET | ORAL | 0 refills | Status: DC
Start: 2017-02-13 — End: 2017-02-13

## 2017-02-13 NOTE — Progress Notes (Signed)
[facility]  02/13/2017 1:28 PM Shane Nash  MRN:  [id number] Subjective:  Pt is doing better. He did laundry yesterday and overall hygiene is better. He has been out of his room for meals. He is excited to discharge home tomorrow. He denies hearing voices or having VH. Denies SI or HI. He is still very concrete in thinking but organized in conversation.  Principal Problem: Disorganized schizophrenia (HCC) Diagnosis:   Patient Active Problem List   Diagnosis Date Noted  . 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 Schizophrenia (HCC) [F20.9] 01/27/2017  . Hyponatremia [E87.1] 01/27/2017  . Polydipsia [R63.1] 01/27/2017   Total Time spent with patient: 15 minutes  Past Psychiatric History: See H&P  Past Medical History:  Past Medical History:  Diagnosis Date  . Schizophrenia (HCC)    History reviewed. No pertinent surgical history. Family History: History reviewed. No pertinent family history. Family Psychiatric  History: See H&P Social History:  Social History   Substance and Sexual Activity  Alcohol Use Not on file     Social History   Substance and Sexual Activity  Drug Use No    Social History   Socioeconomic History  . Marital status: Single    Spouse name: None  . Number of children: None  . Years of education: None  . Highest education level: None  Social Needs  . Financial resource strain: None  . Food insecurity - worry: None  . Food insecurity - inability: None  . Transportation needs - medical: None  . Transportation needs - non-medical: None  Occupational History  . None  Tobacco Use  . Smoking status: Current Every Day Smoker    Packs/day: 0.25    Years: 15.00    Pack years: 3.75    Types: Cigarettes  . Smokeless tobacco: Never Used  . Tobacco comment: Cannot state the amount he smokes per day  Substance and Sexual Activity  . Alcohol use: None  . Drug use: No  . Sexual activity: Not Currently   Other Topics Concern  . None  Social History Narrative  . None   Additional Social History:                         Sleep: Good  Appetite:  Good  Current Medications: Current Facility-Administered Medications  Medication Dose Route Frequency Provider Last Rate Last Dose  . acetaminophen (TYLENOL) tablet 650 mg  650 mg Oral Q6H PRN [name], [name], MD      . alum & mag hydroxide-simeth (MAALOX/MYLANTA) 200-200-20 MG/5ML suspension 30 mL  30 mL Oral Q4H PRN [name], [name], MD      . benztropine (COGENTIN) tablet 1 mg  1 mg Oral BID McNew, Ileene HutchinsonHolly R, MD   1 mg at 02/13/17 [id number]  . cloNIDine (CATAPRES) tablet 0.1 mg  0.1 mg Oral BID PRN McNew, Ileene HutchinsonHolly R, MD      . divalproex (DEPAKOTE ER) 24 hr tablet 500 mg  500 mg Oral QHS McNew, Holly R, MD   500 mg at 02/12/17 2122  . haloperidol (HALDOL) tablet 10 mg  10 mg Oral QHS Darliss RidgelKapur, Aarti K, MD   10 mg at 02/12/17 2122  . haloperidol (HALDOL) tablet 5 mg  5 mg Oral Q6H PRN McNew, Ileene HutchinsonHolly R, MD      . haloperidol (HALDOL) tablet 5 mg  5 mg Oral Daily McNew, Ileene HutchinsonHolly R, MD   5 mg at  02/13/17 8469  . LORazepam (ATIVAN) tablet 1 mg  1 mg Oral Q4H PRN McNew, Ileene Hutchinson, MD      . magnesium hydroxide (MILK OF MAGNESIA) suspension 30 mL  30 mL Oral Daily PRN [name], [name], MD      . nicotine (NICODERM CQ - dosed in mg/24 hours) patch 21 mg  21 mg Transdermal Daily McNew, Ileene Hutchinson, MD   21 mg at 02/10/17 0815  . sodium chloride tablet 1 g  1 g Oral TID WC [name], [name], MD   1 g at 02/13/17 1250  . traZODone (DESYREL) tablet 100 mg  100 mg Oral QHS PRN Haskell Riling, MD   100 mg at 02/06/17 2205  . traZODone (DESYREL) tablet 50 mg  50 mg Oral QHS McNew, Ileene Hutchinson, MD   50 mg at 02/12/17 2122    Lab Results: No results found for this or any previous visit (from the past 48 hour(s)).  Blood Alcohol level:  Lab Results  Component Value Date   ETH <10 01/27/2017    Metabolic Disorder Labs: Lab Results  Component Value Date   HGBA1C 5.2  01/31/2017   MPG 102.54 01/31/2017   No results found for: PROLACTIN Lab Results  Component Value Date   CHOL 160 01/31/2017   TRIG 95 01/31/2017   HDL 39 (L) 01/31/2017   CHOLHDL 4.1 01/31/2017   VLDL 19 01/31/2017   LDLCALC 102 (H) 01/31/2017    Physical Findings: AIMS: Facial and Oral Movements Muscles of Facial Expression: None, normal Lips and Perioral Area: None, normal Jaw: None, normal Tongue: None, normal,Extremity Movements Upper (arms, wrists, hands, fingers): Minimal Lower (legs, knees, ankles, toes): None, normal, Trunk Movements Neck, shoulders, hips: None, normal, Overall Severity Severity of abnormal movements (highest score from questions above): None, normal Incapacitation due to abnormal movements: None, normal Patient's awareness of abnormal movements (rate only patient's report): No Awareness, Dental Status Current problems with teeth and/or dentures?: No Does patient usually wear dentures?: No  CIWA:    COWS:     Musculoskeletal: Strength & Muscle Tone: within normal limits, less stiffness today Gait & Station: normal Patient leans: N/A  Psychiatric Specialty Exam: Physical Exam  Nursing note and vitals reviewed. NO muscle stiffiness on exam or cogwheeling on exam  Review of Systems  All other systems reviewed and are negative.   Blood pressure 120/75, pulse 71, temperature 97.9 F (36.6 C), temperature source Oral, resp. rate 18, height 5' 9.49" (1.765 m), weight 76.2 kg (168 lb), SpO2 99 %.Body mass index is 24.46 kg/m.  General Appearance: Casual  Eye Contact:  Minimal  Speech:  Clear and Coherent  Volume:  Normal  Mood:  Euthymic  Affect:  Flat  Thought Process:  Coherent  Orientation:  Full (Time, Place, and Person)  Thought Content:  Logical  Suicidal Thoughts:  No  Homicidal Thoughts:  No  Memory:  Immediate;   Fair  Judgement:  Impaired  Insight:  Lacking  Psychomotor Activity:  Odd hand movements  Concentration:   Concentration: Fair  Recall:  [employer] of Knowledge:  Fair  Language:  Fair  Akathisia:  No      Assets:  Resilience  ADL's:  Intact  Cognition:  WNL  Sleep:  Number of Hours: 5     Treatment Plan Summary: 37 yo male admitted due to decompensation of schizophrenia. Overall he has improved. Hygiene is much better and more organized in conversation. He has not been agitated  at all on the unit. He is caring for his ADLs much better. He is sleeping better. He is tolerating current dose of Haldol which was lowered yesterday due to some mild muscle stiffness. He received 50 mg additional of Haldol Dec yesterday for total monthly dose of 150 mg  Plan: -Continue oral haldol 5 mg qam and 10 mg qhs. He will continue oral for 4 weeks -he was given Haldol Dec 50 mg yesterday. He will get 150 mg total monthly. Haldol Decanoate 150 mg due 03/06/17 -Cogentin 1 mg BID -Continue Depakote 500 mg qhs  Dispo -Discharge tomorrow to sister's house. HE will follow up with ACT team Haskell Riling, MD 02/13/2017, 1:28 PM

## 2017-02-13 NOTE — Plan of Care (Addendum)
[facility] , staff having to redirect patient on information . Emotionally  patient calm  able to take directions . Working on Pharmacologistcoping skills . Patient   able to inform staff of her needs . Denies suicidal ideations . Redirected  On diease concept.  Progressing Activity: Will identify at least one activity in which they can participate 02/13/2017 1505 - Progressing by Crist InfanteFarrish, Kamara Allan A, RN Coping: Ability to identify and develop effective coping behavior will improve 02/13/2017 1505 - Progressing by Crist InfanteFarrish, Keyshon Stein A, RN Ability to interact with others will improve 02/13/2017 1505 - Progressing by Crist InfanteFarrish, Cathye Kreiter A, RN Participation in decision-making will improve 02/13/2017 1505 - Progressing by Crist InfanteFarrish, Jesaiah Fabiano A, RN Ability to use eye contact when communicating with others will improve 02/13/2017 1505 - Progressing by Crist InfanteFarrish, Mayrin Schmuck A, RN Health Behavior/Discharge Planning: Identification of resources available to assist in meeting health care needs will improve 02/13/2017 1505 - Progressing by Crist InfanteFarrish, Anela Bensman A, RN Self-Concept: Ability to verbalize positive feelings about self will improve 02/13/2017 1505 - Progressing by Crist InfanteFarrish, Westly Hinnant A, RN Activity: Sleeping patterns will improve 02/13/2017 1505 - Progressing by Crist InfanteFarrish, Orie Baxendale A, RN   disease condition.  Mood  and behavior calm , patient attending some  unit programing Voice no concerns

## 2017-02-13 NOTE — [facility] Group Notes (Signed)
02/13/2017  Time: 1:00PM  Type of Therapy/Topic:  Group Therapy:  Balance in Life  Participation Level:  None  Description of Group:   This group will address the concept of balance and how it feels and looks when one is unbalanced. Patients will be encouraged to process areas in their lives that are out of balance and identify reasons for remaining unbalanced. Facilitators will guide patients in utilizing problem-solving interventions to address and correct the stressor making their life unbalanced. Understanding and applying boundaries will be explored and addressed for obtaining and maintaining a balanced life. Patients will be encouraged to explore ways to assertively make their unbalanced needs known to significant others in their lives, using other group members and facilitator for support and feedback.  Therapeutic Goals: 1. Patient will identify two or more emotions or situations they have that consume much of in their lives. 2. Patient will identify signs/triggers that life has become out of balance:  3. Patient will identify two ways to set boundaries in order to achieve balance in their lives:  4. Patient will demonstrate ability to communicate their needs through discussion and/or role plays  Summary of Patient Progress: Pt continues to work towards their tx goals but has not yet reached them. Pt attended group but did not participate in group discussions. When directly prompted by CSW, pt reported, "I don't know," to all questions. Pt reported, "I'm just trying to do the best I can so I can go home."   Therapeutic Modalities:   Cognitive Behavioral Therapy Solution-Focused Therapy Assertiveness Training  Heidi DachKelsey Nonna Renninger, MSW, LCSW 02/13/2017 1:37 PM

## 2017-02-13 NOTE — [facility] Group Notes (Signed)
[facility] Group Notes:  (Nursing/MHT/Case Management/Adjunct)  Date:  02/13/2017  Time:  3:24 PM  Type of Therapy:  Psychoeducational Skills  Participation Level:  None  Participation Quality:  Intrusive  Affect:  Blunted  Cognitive:  Disorganized and Lacking  Insight:  None  Engagement in Group:  None  Modes of Intervention:  Discussion  Summary of Progress/Problems:  Foy GuadalajaraJasmine R Koltyn Kelsay 02/13/2017, 3:24 PM

## 2017-02-13 NOTE — Progress Notes (Signed)
Patient ID: Shane Nash, male   DOB: 1980-09-02, 37 y.o.   MRN: [id number] Pleasant on approach, in and out of his room, denied SI/HI, denied AV/H, "I only ear myself ...." patient said with a smile. Medication compliant, anticipating discharge on Friday to his sister.

## 2017-02-13 NOTE — Progress Notes (Signed)
Recreation Therapy Notes   Date: 01.17.2019  Time: 9:30 am   Location: Craft Room  Behavioral response: Appropriate  Intervention Topic: Anger  Discussion/Intervention: Group content on today was focused on anger management. The group defined anger and reasons they become angry. Individuals expressed negative way they have dealt with anger in the past. Patients stated some positive ways they could deal with anger in the future. The group described how anger can affect your health and daily plans. Individuals participated in the intervention "Score your anger" where they had a chance to answer questions about themselves and get a score of their anger.  Clinical Observations/Feedback:  Patient came to group and defined anger as when someone gets mad. He participated in the intervention and continues to make progress towards his goals.  Mahli Glahn LRT/CTRS          Priscella Donna 02/13/2017 10:35 AM

## 2017-02-13 NOTE — B[facility] Group Notes (Signed)
LCSW Group Therapy Note 02/13/2017 9:00 AM  Type of Therapy and Topic:  Group Therapy:  Setting Goals  Participation Level:  Did Not Attend  Description of Group: In this process group, patients discussed using strengths to work toward goals and address challenges.  Patients identified two positive things about themselves and one goal they were working on.  Patients were given the opportunity to share openly and support each other's plan for self-empowerment.  The group discussed the value of gratitude and were encouraged to have a daily reflection of positive characteristics or circumstances.  Patients were encouraged to identify a plan to utilize their strengths to work on current challenges and goals.  Therapeutic Goals 1. Patient will verbalize personal strengths/positive qualities and relate how these can assist with achieving desired personal goals 2. Patients will verbalize affirmation of peers plans for personal change and goal setting 3. Patients will explore the value of gratitude and positive focus as related to successful achievement of goals 4. Patients will verbalize a plan for regular reinforcement of personal positive qualities and circumstances.  Summary of Patient Progress:       Therapeutic Modalities Cognitive Behavioral Therapy Motivational Interviewing    Shane FrameSonya S Jentry Mcqueary, LCSW 02/13/2017 2:42 PM

## 2017-02-13 NOTE — Plan of Care (Signed)
Patient slept for Estimated Hours of 5; Precautionary checks every 15 minutes for safety maintained, room free of safety hazards, patient sustains no injury or falls during this shift.  

## 2017-02-14 MED ORDER — HALOPERIDOL DECANOATE 100 MG/ML IM SOLN: 150.0000 mg | INTRAMUSCULAR | 0 refills | Status: DC

## 2017-02-14 NOTE — Progress Notes (Signed)
Recreation Therapy Notes  Date: 01.18.2019  Time: 9:30 am  Location: Craft Room  Behavioral response: Appropriate  Intervention Topic: Emotions  Discussion/Intervention: Group content on today was focused on emotions. The group identified what emotions are and why it is important to have emotions. Patients expressed some positive and negative emotions. Individuals gave some past experiences on how they normally dealt with emotions in the past. The group described some positive ways to deal with emotions in the future. Patients participated in the intervention "what a day" where individuals were given a chance to respond to certain situations involving their emotions. Clinical Observations/Feedback:  Patient came to group and stated that emotions can come from a relationship. He was social with peers during group and participated in the intervention.  Kylon Philbrook LRT/CTRS         Evelyna Folker 02/14/2017 11:26 AM

## 2017-02-14 NOTE — Progress Notes (Signed)
  [facility] :  Will you be returning to the same living situation after discharge:  Yes,  returning to sister's home. At discharge, do you have transportation home?: Yes,  sister is picking up. Do you have the ability to pay for your medications: Yes,  ACTT  Release of information consent forms completed and in the chart;  Patient's signature needed at discharge.  Patient to Follow up at: Follow-up Information    Services, [facility]. Go on 02/18/2017.   Why:  Your ACT Team will pick you up at your home on Tuesday, 02/18/17 between 11AM and 1PM.  Contact information: [address] [place] [address] [phone]           Next level of care provider [facility]no  Safety Planning and Suicide Prevention discussed: Yes,  with pt and his sister.     Has patient been referred to the Quitline?: N/A patient is not a smoker  Patient has been referred for addiction treatment: N/A  Heidi DachKelsey Jemia Fata, LCSW 02/14/2017, 8:09 AM

## 2017-02-14 NOTE — Progress Notes (Signed)
Patient ID: Shane Nash, male   DOB: 05/03/1980, 37 y.o.   MRN: [id number] Pleasant, smiles, disheveled still, medication compliant, denied pain, denied SI/HI/AVH, anticipating discharge to his sister in the morning

## 2017-02-14 NOTE — Progress Notes (Signed)
Recreation Therapy Notes  INPATIENT RECREATION TR PLAN  Patient Details Name: Shane Nash MRN: [id number] DOB: July 19, 1980 Today's Date: 02/14/2017  Rec Therapy Plan Is patient appropriate for Therapeutic Recreation?: Yes Treatment times per week: at least 3 Estimated Length of Stay: 5-7 days TR Treatment/Interventions: Group participation (Comment)  Discharge Criteria Pt will be discharged from therapy if:: Discharged Treatment plan/goals/alternatives discussed and agreed upon by:: Patient/family  Discharge Summary Short term goals set: Patient will attend and participate in Recreation Therapy Group Sessions x5 days  Short term goals met: Adequate for discharge Progress toward goals comments: Groups attended Which groups?: Anger management, Self-esteem, Other (Comment)(Emotions) Reason goals not met: N/A Therapeutic equipment acquired: N/A Reason patient discharged from therapy: Discharge from hospital Pt/family agrees with progress & goals achieved: Yes Date patient discharged from therapy: 02/14/17   Shane Nash 02/14/2017, 11:40 AM

## 2017-02-14 NOTE — Progress Notes (Signed)
Received Shane Nash this am after his breakfast, he was compliant with is medications. He has been OB at intervals in the milieu throughout the day and for meals. He received his discharge order, the AVS was reviewed and his questions answered. He received his prescriptions and his 7 day supply of medications. He was discharged with his neice without incident. The AVS was explained and questions answered to the niece.

## 2017-02-14 NOTE — [facility] Suicide Risk Assessment (Signed)
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 Schizophrenia (HCC) [F20.9] 01/27/2017  . Hyponatremia [E87.1] 01/27/2017  . 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  Patients presenting with no risk factors but with morbid ruminations; may be classified as minimal risk based on the severity of the depressive symptoms  Follow-up Information    Services, Psychotherapeutic. Go on 02/18/2017.   Why:  Your ACT Team will pick you up at your home on Tuesday, 02/18/17 between 11AM and 1PM.  Contact information: [address] [place] [address] [phone]           Plan Of Care/Follow-up recommendations: Follow up with ACT team  Haskell RilingHolly R Harkirat Orozco, MD 02/14/2017, 9:12 AM

## 2017-02-14 NOTE — B[facility] Group Notes (Addendum)
02/14/2017 1PM  Type of Therapy and Topic:  Group Therapy:  Feelings around Relapse and Recovery  Participation Level:  None   Description of Group:    Patients in this group will discuss emotions they experience before and after a relapse. They will process how experiencing these feelings, or avoidance of experiencing them, relates to having a relapse. Facilitator will guide patients to explore emotions they have related to recovery. Patients will be encouraged to process which emotions are more powerful. They will be guided to discuss the emotional reaction significant others in their lives may have to patients' relapse or recovery. Patients will be assisted in exploring ways to respond to the emotions of others without this contributing to a relapse.  Therapeutic Goals: 1. Patient will identify two or more emotions that lead to a relapse for them 2. Patient will identify two emotions that result when they relapse 3. Patient will identify two emotions related to recovery 4. Patient will demonstrate ability to communicate their needs through discussion and/or role plays   Summary of Patient Progress: Patient stayed the entire time and attempted to listen to the facilitator but was unable to due to other group members speaking over her.    Therapeutic Modalities:   Cognitive Behavioral Therapy Solution-Focused Therapy Assertiveness Training Relapse Prevention Therapy   Johny ShearsCassandra  Jalisia Puchalski, LCSW 02/14/2017 1:48 PM

## 2017-02-14 NOTE — Discharge Summary (Signed)
Physician Discharge Summary Note  Patient:  Shane Nash is an 37 y.o., male MRN:  [id number] DOB:  [birth year] Patient phone:  [phone] (home)  Patient address:   [address] [place] [address],  Total Time spent with patient: 20 minutes Plus 20 minutes of medication reconciliation, discharge planning, and discharge documentation  Date of Admission:  01/30/2017 Date of Discharge: 02/14/17  Reason for Admission:  37 yo male with history of schizophrenia brought to the hospital under IVC that report he was acting bizarrely, urinated on himself and incoherent. Pt was very disorganized in the ED. HE was found to be severely hyponatremic. He has history of polydipsia so was placed on water restriction. His sodium did improve. He appears to have had an admission here back in 2015 and was on Zyprexa. Per chart, he has long history of mental illness. HE has had very few hospitalizations. HE used to follow with ACT team but I have contacted them today and he is no longer a client of theirs. Pt has history of treatment noncompliance especially when visiting family members. It is quite possible that he was with family over the holidays and stopped medications. It is noted that he is low functioning.            On assessment today, pt is not able to give much information. Pt was found by RN last night rubbing soap all over himself. He was very pressured and disorganized. HE was visibly responding to internal stimuli, laughing inappropriately and talking to unseen others. HE slept less than an hour last night. Today, pt is able to state that he is at "[facility] center." HE states that he has been here before. HE is oriented to "January 2019" and president "Trump." He states that he is here because 'I'm trying to get back on track and in 30 days I want to get a job. I want to keep things straight." He denies feeling depressed or sad. He denies feeling suicidal or homicidal. He states  that he did get out of jail recently after spending 90 days for stealing a car. He mentions "Haldol" as a possible medication that he was taking but unable to answer when he last took it. He states that he sees "Dr. Madaline Guthrie" for medications. He no longer sees ACT team. HE has very poor eye contact and appears to be talking to himself under his breath and making odd movements with his hands. He reports using marijuana "sometimes" and "I enjoy smoking cigarettes a lot." HE is slightly more coherent today but still has garbled speech and very confused. After treatment team today, he got very confused when leaving and was continuously opening and closing the door.     Principal Problem: Disorganized schizophrenia [facility]) Discharge Diagnoses: Patient Active Problem List   Diagnosis Date Noted  . 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 Schizophrenia (HCC) [F20.9] 01/27/2017  . Hyponatremia [E87.1] 01/27/2017  . 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 Schizophrenia (HCC)    History reviewed. No pertinent surgical history. Family History: History reviewed. No pertinent family history. Family Psychiatric  History: See H&P Social History:  Social History   Substance and Sexual Activity  Alcohol Use Not on file     Social History   Substance and Sexual Activity  Drug Use No    Social History  Socioeconomic History  . Marital status: Single    Spouse name: None  . Number of children: None  . Years of education: None  . Highest education level: None  Social Needs  . Financial resource strain: None  . Food insecurity - worry: None  . Food insecurity - inability: None  . Transportation needs - medical: None  . Transportation needs - non-medical: None  Occupational History  . None  Tobacco Use  . Smoking status: Current Every Day Smoker    Packs/day: 0.25     Years: 15.00    Pack years: 3.75    Types: Cigarettes  . Smokeless tobacco: Never Used  . Tobacco comment: Cannot state the amount he smokes per day  Substance and Sexual Activity  . Alcohol use: None  . Drug use: No  . Sexual activity: Not Currently  Other Topics Concern  . None  Social History Narrative  . None    Hospital Course:  Pt was found to be hyponatremic due to polydipsia. Sodium was corrected prior to admission and he was placed on water restriction.  Pt was initially started on Zyprexa in the ED which he had been on in the past. However, pt has long history of noncompliance with medications so this was transitioned to Haldol with plan to get him on Haldol decanoate. He was titrated up to 20 mg oral medication. He had some very mild muscle stiffness on exam so this was lowered back to 15 mg daily. He was given initial dose of Haldol decanoate 100 mg on 02/05/17. He was given an additional 50 mg dose on 02/12/17 and tolerated this well. He was also started on Depakote as he was sleeping 0 hours on admission and felt there may have been a mood component to his presentation. This may be able to be discontinued in the future once Haldol Dec is therapeutic. He was overall very concrete in his thinking and overall lower functioning. His hygiene improved after encouragement to shower. He also did laundry on the unit. He isolated a lot to his room and mostly kept to himself. He did come out more often towards the end of hospitalization. He appeared to be responding to internal stimuli at times but much less so at the end of hospitalization. He did have odd hand gestures which were also present in 2015 according to chart review. It did not look typical of tardive dyskinesia. On day of discharge, he was organized and goal directed. He was oriented to place, date, situation. He was smiling towards discharge which was a big improvement as his affect is generally very flat. He stated that he was excited  to see his sister. He is open to follow with ACT team. They did do an evaluation during hospitalization and plan to follow him in the community. We did discuss option for group home with patient but he declined this. He is his own guardian currently.     Physical Findings: AIMS: Facial and Oral Movements Muscles of Facial Expression: None, normal Lips and Perioral Area: None, normal Jaw: None, normal Tongue: None, normal,Extremity Movements Upper (arms, wrists, hands, fingers): Minimal Lower (legs, knees, ankles, toes): None, normal, Trunk Movements Neck, shoulders, hips: None, normal, Overall Severity Severity of abnormal movements (highest score from questions above): None, normal Incapacitation due to abnormal movements: None, normal Patient's awareness of abnormal movements (rate only patient's report): No Awareness, Dental Status Current problems with teeth and/or dentures?: No Does patient usually wear dentures?: No  CIWA:  COWS:     Musculoskeletal: Strength & Muscle Tone: within normal limits Gait & Station: normal Patient leans: N/A  Psychiatric Specialty Exam: Physical Exam  ROS  Blood pressure 103/76, pulse 70, temperature 98 F (36.7 C), temperature source Oral, resp. rate 18, height 5' 9.49" (1.765 m), weight 76.2 kg (168 lb), SpO2 99 %.Body mass index is 24.46 kg/m.  General Appearance: Disheveled but greatly improved  Eye Contact:  Minimal  Speech:  Clear and Coherent  Volume:  Normal  Mood:  Euthymic  Affect:  Flat  Thought Process:  Coherent  Orientation:  Full (Time, Place, and Person)  Thought Content:  Logical  Suicidal Thoughts:  No  Homicidal Thoughts:  No  Memory:  Immediate;   Fair  Judgement:  Impaired  Insight:  Lacking  Psychomotor Activity:  Normal  Concentration:  Concentration: Fair  Recall:  Fair  Fund of Knowledge:  Fair  Language:  Fair  Akathisia:  No      Assets:  Resilience  ADL's:  Intact  Cognition:  WNL  Sleep:  Number  of Hours: 5.3        Has this patient used any form of tobacco in the last 30 days? (Cigarettes, Smokeless Tobacco, Cigars, and/or Pipes) No  Blood Alcohol level:  Lab Results  Component Value Date   ETH <10 01/27/2017    Metabolic Disorder Labs:  Lab Results  Component Value Date   HGBA1C 5.2 01/31/2017   MPG 102.54 01/31/2017   No results found for: PROLACTIN Lab Results  Component Value Date   CHOL 160 01/31/2017   TRIG 95 01/31/2017   HDL 39 (L) 01/31/2017   CHOLHDL 4.1 01/31/2017   VLDL 19 01/31/2017   LDLCALC 102 (H) 01/31/2017    See Psychiatric Specialty Exam and Suicide Risk Assessment completed by Attending Physician prior to discharge.  Discharge destination:  Home with sister  Is patient on multiple antipsychotic therapies at discharge:  No   Has Patient had three or more failed trials of antipsychotic monotherapy by history:  No  Recommended Plan for Multiple Antipsychotic Therapies: NA  Discharge Instructions    Increase activity slowly   Complete by:  As directed      Allergies as of 02/14/2017   No Known Allergies     Medication List    TAKE these medications     Indication  benztropine 1 MG tablet Commonly known as:  COGENTIN Take 1 tablet (1 mg total) by mouth 2 (two) times daily.  Indication:  Extrapyramidal Reaction caused by Medications   divalproex 500 MG 24 hr tablet Commonly known as:  DEPAKOTE ER Take 1 tablet (500 mg total) by mouth at bedtime.  Indication:  Mood   haloperidol 5 MG tablet Commonly known as:  HALDOL Take 1-2 tablets (5-10 mg total) by mouth See admin instructions. Take 1 tablet in the morning (5 mg) and 2 tablets (10 mg) at bedtime for 4 weeks then stop.  Indication:  Schizophrenia   haloperidol decanoate 100 MG/ML injection Commonly known as:  HALDOL DECANOATE Inject 1.5 mLs (150 mg total) into the muscle every 28 (twenty-eight) days. Next dose due 03/06/17 Start taking on:  03/06/2017  Indication:   Schizophrenia      Follow-up Information    Services, Psychotherapeutic. Go on 02/18/2017.   Why:  Your ACT Team will pick you up at your home on Tuesday, 02/18/17 between 11AM and 1PM.  Contact information: [address] [place] [address] [phone]  Follow-up recommendations: ACT team, monitor sodium due to history of polydipsia Signed: Haskell Riling, MD 02/14/2017, 9:13 AM

## 2017-02-14 NOTE — Plan of Care (Signed)
Patient slept for Estimated Hours of 5.30; Precautionary checks every 15 minutes for safety maintained, room free of safety hazards, patient sustains no injury or falls during this shift.  

## 2018-03-25 ENCOUNTER — Inpatient Hospital Stay
Admission: EM | Admit: 2018-03-25 | Discharge: 2018-03-26 | DRG: 640 | Disposition: A | Discharge status: Other Acute Inpatient Hospital | Payer: Medicaid Other | Attending: Internal Medicine | Admitting: Internal Medicine

## 2018-03-25 DIAGNOSIS — Z716 Tobacco abuse counseling: Secondary | ICD-10-CM | POA: Diagnosis not present

## 2018-03-25 DIAGNOSIS — R631 Polydipsia: Secondary | ICD-10-CM | POA: Diagnosis present

## 2018-03-25 DIAGNOSIS — G249 Dystonia, unspecified: Secondary | ICD-10-CM | POA: Diagnosis present

## 2018-03-25 DIAGNOSIS — F201 Disorganized schizophrenia: Secondary | ICD-10-CM

## 2018-03-25 DIAGNOSIS — G9341 Metabolic encephalopathy: Secondary | ICD-10-CM | POA: Diagnosis present

## 2018-03-25 DIAGNOSIS — E871 Hypo-osmolality and hyponatremia: Secondary | ICD-10-CM | POA: Diagnosis not present

## 2018-03-25 DIAGNOSIS — F17210 Nicotine dependence, cigarettes, uncomplicated: Secondary | ICD-10-CM | POA: Diagnosis present

## 2018-03-25 DIAGNOSIS — F22 Delusional disorders: Secondary | ICD-10-CM | POA: Diagnosis not present

## 2018-03-25 DIAGNOSIS — Z79899 Other long term (current) drug therapy: Secondary | ICD-10-CM | POA: Diagnosis not present

## 2018-03-25 DIAGNOSIS — Z9114 Patient's other noncompliance with medication regimen: Secondary | ICD-10-CM | POA: Diagnosis not present

## 2018-03-25 DIAGNOSIS — F209 Schizophrenia, unspecified: Secondary | ICD-10-CM | POA: Diagnosis present

## 2018-03-25 DIAGNOSIS — R45851 Suicidal ideations: Secondary | ICD-10-CM | POA: Diagnosis present

## 2018-03-25 LAB — COMPREHENSIVE METABOLIC PANEL
ALBUMIN: 4 g/dL (ref 3.5–5.0)
ALT: 24 U/L (ref 0–44)
AST: 63 U/L — ABNORMAL HIGH (ref 15–41)
Alkaline Phosphatase: 73 U/L (ref 38–126)
Anion gap: 11 (ref 5–15)
BUN: 12 mg/dL (ref 6–20)
CO2: 21 mmol/L — ABNORMAL LOW (ref 22–32)
Calcium: 8.8 mg/dL — ABNORMAL LOW (ref 8.9–10.3)
Chloride: 95 mmol/L — ABNORMAL LOW (ref 98–111)
Creatinine, Ser: 0.86 mg/dL (ref 0.61–1.24)
GFR calc Af Amer: >60 mL/min (ref >60)
GFR calc non Af Amer: >60 mL/min (ref >60)
Glucose, Bld: 96 mg/dL (ref 70–99)
Potassium: 3.8 mmol/L (ref 3.5–5.1)
Sodium: 127 mmol/L — ABNORMAL LOW (ref 135–145)
Total Bilirubin: 1.2 mg/dL (ref 0.3–1.2)
Total Protein: 7.6 g/dL (ref 6.5–8.1)

## 2018-03-25 LAB — CBC
HCT: 38.5 % — ABNORMAL LOW (ref 39.0–52.0)
Hemoglobin: 13.9 g/dL (ref 13.0–17.0)
MCH: 31.5 pg (ref 26.0–34.0)
MCHC: 36.1 g/dL — ABNORMAL HIGH (ref 30.0–36.0)
MCV: 87.3 fL (ref 80.0–100.0)
Platelets: 306 10*3/uL (ref 150–400)
RBC: 4.41 MIL/uL (ref 4.22–5.81)
RDW: 11.5 % (ref 11.5–15.5)
WBC: 9.1 10*3/uL (ref 4.0–10.5)
nRBC: 0 % (ref 0.0–0.2)

## 2018-03-25 LAB — VALPROIC ACID LEVEL: Valproic Acid Lvl: <10 ug/mL — ABNORMAL LOW (ref 50.0–100.0)

## 2018-03-25 LAB — SALICYLATE LEVEL: Salicylate Lvl: <7 mg/dL (ref 2.8–30.0)

## 2018-03-25 LAB — ETHANOL: Alcohol, Ethyl (B): <10 mg/dL (ref <10)

## 2018-03-25 LAB — ACETAMINOPHEN LEVEL: Acetaminophen (Tylenol), Serum: <10 ug/mL — ABNORMAL LOW (ref 10–30)

## 2018-03-25 MED ORDER — HALOPERIDOL 5 MG PO TABS
10.0000 mg | ORAL_TABLET | Freq: Once | ORAL | Status: AC
  Administered 2018-03-25: 10 mg via ORAL
  Filled 2018-03-25: qty 2

## 2018-03-25 MED ORDER — HALOPERIDOL 5 MG PO TABS
10.0000 mg | ORAL_TABLET | Freq: Every day | ORAL | Status: DC
  Administered 2018-03-25: 10 mg via ORAL
  Filled 2018-03-25: qty 2

## 2018-03-25 MED ORDER — DIPHENHYDRAMINE HCL 25 MG PO CAPS
25.0000 mg | ORAL_CAPSULE | Freq: Every day | ORAL | Status: DC
  Administered 2018-03-25: 25 mg via ORAL
  Filled 2018-03-25: qty 1

## 2018-03-25 MED ORDER — DIVALPROEX SODIUM ER 250 MG PO TB24
1000.0000 mg | ORAL_TABLET | Freq: Every day | ORAL | Status: DC
  Administered 2018-03-25: 1000 mg via ORAL
  Filled 2018-03-25: qty 4

## 2018-03-25 NOTE — ED Provider Notes (Signed)
[facility] Emergency Department Provider Note  ___________________________________   First MD Initiated Contact with Patient 03/25/18 1002     (approximate)  I have reviewed the triage vital signs and the nursing notes.   HISTORY  Chief Complaint Paranoid  Chief complaint he does not feel good  HPI Shane Nash is a 38 y.o. male who comes in via EMS.  They report he called them because he was not feeling well and has been drinking water incessantly from the sink.  Patient appears to be very paranoid.  He may not be taking his medicines per EMS.  He has a history of polydipsia and hyponatremia.  Past Medical History:  Diagnosis Date  . Schizophrenia [facility])     Patient Active Problem List   Diagnosis Date Noted  . 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 Schizophrenia (HCC) 01/27/2017  . Hyponatremia 01/27/2017  . Polydipsia 01/27/2017    History reviewed. No pertinent surgical history.  Prior to Admission medications   Medication Sig Start Date End Date Taking? Authorizing Provider  benztropine (COGENTIN) 1 MG tablet Take 1 tablet (1 mg total) by mouth 2 (two) times daily. 02/13/17   McNew, Ileene Hutchinson, MD  divalproex (DEPAKOTE ER) 500 MG 24 hr tablet Take 1 tablet (500 mg total) by mouth at bedtime. 02/13/17   McNew, Ileene Hutchinson, MD  haloperidol (HALDOL) 10 MG tablet Take 10 mg by mouth 2 (two) times daily. 03/20/18   [provider]  haloperidol decanoate (HALDOL DECANOATE) 100 MG/ML injection Inject 1.5 mLs (150 mg total) into the muscle every 28 (twenty-eight) days. Next dose due 03/06/17 03/06/17   McNew, Ileene Hutchinson, MD    Allergies Patient has no known allergies.  History reviewed. No pertinent family history.  Social History Social History   Tobacco Use  . Smoking status: Current Every Day Smoker    Packs/day: 0.25    Years: 15.00    Pack years: 3.75    Types: Cigarettes  . Smokeless tobacco: Never Used  . Tobacco comment:  Cannot state the amount he smokes per day  Substance Use Topics  . Alcohol use: Not on file  . Drug use: No    Review of Systems  Constitutional: No fever/chills Eyes: No visual changes. ENT: No sore throat. Cardiovascular: Denies chest pain. Respiratory: Denies shortness of breath. Gastrointestinal: No abdominal pain.  No nausea, no vomiting.  No diarrhea.  No constipation. Genitourinary: Negative for dysuria. Musculoskeletal: Negative for back pain. Skin: Negative for rash. Neurological: Negative for headaches, focal weakness or  ____________________________________________   PHYSICAL EXAM:  VITAL SIGNS: ED Triage Vitals  Enc Vitals Group     BP      Pulse      Resp      Temp      Temp src      SpO2      Weight      Height      Head Circumference      Peak Flow      Pain Score      Pain Loc      Pain Edu?      Excl. in GC?     Constitutional: Alert and oriented. Well appearing and in no acute distress. Eyes: Conjunctivae are normal.  Head: Atraumatic. Nose: No congestion/rhinnorhea. Mouth/Throat: Mucous membranes are moist.  Oropharynx non-erythematous. Neck: No stridor.   Cardiovascular: Normal rate, regular rhythm. Grossly normal heart sounds.  Good peripheral circulation. Respiratory: Normal respiratory effort.  No retractions. Lungs CTAB. Gastrointestinal: Soft and nontender. No distention. No abdominal bruits. No CVA tenderness. Musculoskeletal: No lower extremity tenderness nor edema. Neurologic:  Normal speech and language. No gross focal neurologic deficits are appreciated. No gait instability. Skin:  Skin is warm, dry and intact. No rash noted.   ____________________________________________   LABS (all labs ordered are listed, but only abnormal results are displayed)  Labs Reviewed  COMPREHENSIVE METABOLIC PANEL - Abnormal; Notable for the following components:      Result Value   Sodium 127 (*)    Chloride 95 (*)    CO2 21 (*)    Calcium  8.8 (*)    AST 63 (*)    All other components within normal limits  CBC - Abnormal; Notable for the following components:   HCT 38.5 (*)    MCHC 36.1 (*)    All other components within normal limits  BASIC METABOLIC PANEL  HEPATIC FUNCTION PANEL  CBC WITH DIFFERENTIAL/PLATELET  URINALYSIS, COMPLETE (UACMP) WITH MICROSCOPIC  URINE DRUG SCREEN, QUALITATIVE ([facility] ONLY)  ETHANOL  SALICYLATE LEVEL  ACETAMINOPHEN LEVEL  URINE DRUG SCREEN, QUALITATIVE ([facility] ONLY)   ____________________________________________  EKG   ____________________________________________  RADIOLOGY  ED MD interpretation:    Official radiology report(s): No results found.  ____________________________________________   PROCEDURES  Procedure(s) performed (including Critical Care):  Procedures   ____________________________________________   INITIAL IMPRESSION / ASSESSMENT AND PLAN / ED COURSE   Patient with polydipsia and resultant hyponatremia.  We will get him medically admitted and fluid restricted and psychiatry will need to consult.         ____________________________________________   FINAL CLINICAL IMPRESSION(S) / ED DIAGNOSES  Final diagnoses:  Hyponatremia  Polydipsia     ED Discharge Orders    None       Note:  This document was prepared using Dragon voice recognition software and may include unintentional dictation errors.    Arnaldo Natal, MD 03/25/18 [id number]

## 2018-03-25 NOTE — ED Notes (Signed)
Pt's locked belongings included:  One neon tobagon, one green t-shirt, one pair of blue jeans, one pair of sneakers.

## 2018-03-25 NOTE — Consult Note (Signed)
[facility] Face-to-Face Psychiatry Consult   Reason for Consult: Schizophrenia with possible medication noncompliance Referring Physician: Dr. Darnelle Catalan Patient Identification: MALACAI OLM MRN:  [id number] Principal Diagnosis: Hyponatremia Diagnosis:   Patient Active Problem List   Diagnosis Date Noted  . 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 Schizophrenia (HCC) [F20.9] 01/27/2017  . Hyponatremia [E87.1] 01/27/2017  . 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 male patient admitted with "I do not know".  HPI:  RICARD PENDERGRAPH is a 38 y.o. male who comes in via EMS.  They report he called them because he was not feeling well and has been drinking water incessantly from the sink.  Patient appears to be very paranoid.  He may not be taking his medicines per EMS.  He has a history of polydipsia and hyponatremia.  On evaluation, patient is guarded and keeps his head under his blanket, and unable to answer most questions, stating "I do not know."  After receiving Haldol, patient is minimally more conversant.  He states that he has an ACT team, but he is not certain who his outpatient providers are.  Patient reports that he is thirsty.  He is seen making multiple requests to go to the bathroom.  Patient does not indicate suicidal ideation or intent.  He does not indicate HI.  He does appear to be responding to internal stimuli, but does not specify auditory or visual hallucinations.  Past Psychiatric History: Long history of psychotic disorder multiple prior hospitalizations.  Risk to Self:  Possible Risk to Others:  Possible Prior Inpatient Therapy:  Yes multiple Prior Outpatient Therapy:  Yes, however suspect noncompliant with care at this time.  Past Medical History:  Past Medical History:  Diagnosis Date  . Schizophrenia (HCC)    History reviewed. No pertinent surgical history. Family History:  History reviewed. No pertinent family history. Family Psychiatric  History: None known  Social History:  Social History   Substance and Sexual Activity  Alcohol Use Not on file     Social History   Substance and Sexual Activity  Drug Use No    Social History   Socioeconomic History  . Marital status: Single    Spouse name: Not on file  . Number of children: Not on file  . Years of education: Not on file  . Highest education level: Not on file  Occupational History  . Not on file  Social Needs  . Financial resource strain: Not on file  . Food insecurity:    Worry: Not on file    Inability: Not on file  . Transportation needs:    Medical: Not on file    Non-medical: Not on file  Tobacco Use  . Smoking status: Current Every Day Smoker    Packs/day: 0.25    Years: 15.00    Pack years: 3.75    Types: Cigarettes  . Smokeless tobacco: Never Used  . Tobacco comment: Cannot state the amount he smokes per day  Substance and Sexual Activity  . Alcohol use: Not on file  . Drug use: No  . Sexual activity: Not Currently  Lifestyle  . Physical activity:    Days per week: Not on file    Minutes per session: Not on file  . Stress: Not on file  Relationships  . Social connections:    Talks on phone: Not on file    Gets together: Not  on file    Attends religious service: Not on file    Active member of club or organization: Not on file    Attends meetings of clubs or organizations: Not on file    Relationship status: Not on file  Other Topics Concern  . Not on file  Social History Narrative  . Not on file   Additional Social History:  No additional history identified  Allergies:  No Known Allergies  Labs:  Results for orders placed or performed during the hospital encounter of 03/25/18 (from the past 48 hour(s))  Comprehensive metabolic panel     Status: Abnormal   Collection Time: 03/25/18  1:31 PM  Result Value Ref Range   Sodium 127 (L) 135 - 145 mmol/L    Potassium 3.8 3.5 - 5.1 mmol/L   Chloride 95 (L) 98 - 111 mmol/L   CO2 21 (L) 22 - 32 mmol/L   Glucose, Bld 96 70 - 99 mg/dL   BUN 12 6 - 20 mg/dL   Creatinine, Ser 1.61 0.61 - 1.24 mg/dL   Calcium 8.8 (L) 8.9 - 10.3 mg/dL   Total Protein 7.6 6.5 - 8.1 g/dL   Albumin 4.0 3.5 - 5.0 g/dL   AST 63 (H) 15 - 41 U/L   ALT 24 0 - 44 U/L   Alkaline Phosphatase 73 38 - 126 U/L   Total Bilirubin 1.2 0.3 - 1.2 mg/dL   GFR calc non Af Amer >60 >60 mL/min   GFR calc Af Amer >60 >60 mL/min   Anion gap 11 5 - 15    Comment: Performed at [facility], [address] [place], [place] [address]  Ethanol     Status: None   Collection Time: 03/25/18  1:31 PM  Result Value Ref Range   Alcohol, Ethyl (B) <10 <10 mg/dL    Comment: (NOTE) Lowest detectable limit for serum alcohol is 10 mg/dL. For medical purposes only. Performed at [facility], [address] [place], [place] [address]   Salicylate level     Status: None   Collection Time: 03/25/18  1:31 PM  Result Value Ref Range   Salicylate Lvl <7.0 2.8 - 30.0 mg/dL    Comment: Performed at [facility], [address] [place], [place] [address]  Acetaminophen level     Status: Abnormal   Collection Time: 03/25/18  1:31 PM  Result Value Ref Range   Acetaminophen (Tylenol), Serum <10 (L) 10 - 30 ug/mL    Comment: (NOTE) Therapeutic concentrations vary significantly. A range of 10-30 ug/mL  may be an effective concentration for many patients. However, some  are best treated at concentrations outside of this range. Acetaminophen concentrations >150 ug/mL at 4 hours after ingestion  and >50 ug/mL at 12 hours after ingestion are often associated with  toxic reactions. Performed at [facility], [address] [place], [place] [address]   cbc     Status: Abnormal   Collection Time: 03/25/18  1:31 PM  Result Value Ref Range   WBC 9.1 4.0 - 10.5 K/uL   RBC 4.41 4.22 - 5.81 MIL/uL   Hemoglobin 13.9  13.0 - 17.0 g/dL   HCT 62.1 (L) 30.8 - 65.7 %   MCV 87.3 80.0 - 100.0 fL   MCH 31.5 26.0 - 34.0 pg   MCHC 36.1 (H) 30.0 - 36.0 g/dL   RDW 84.6 96.2 - 95.2 %   Platelets 306 150 - 400 K/uL   nRBC 0.0 0.0 -  0.2 %    Comment: Performed at [facility], [address] [place], [place] [address]    No current facility-administered medications for this encounter.    Current Outpatient Medications  Medication Sig Dispense Refill  . benztropine (COGENTIN) 1 MG tablet Take 1 tablet (1 mg total) by mouth 2 (two) times daily. 60 tablet 0  . divalproex (DEPAKOTE ER) 500 MG 24 hr tablet Take 1 tablet (500 mg total) by mouth at bedtime. 30 tablet 0  . haloperidol (HALDOL) 10 MG tablet Take 10 mg by mouth See admin instructions. Take  tablet (5mg ) by mouth every morning and 1 tablet (10mg ) by mouth every night    . haloperidol decanoate (HALDOL DECANOATE) 100 MG/ML injection Inject 1.5 mLs (150 mg total) into the muscle every 28 (twenty-eight) days. Next dose due 03/06/17 1 mL 0    Musculoskeletal: Strength & Muscle Tone: within normal limits Gait & Station: normal Patient leans: N/A  Psychiatric Specialty Exam: Physical Exam  Constitutional: He appears well-developed and well-nourished.  HENT:  Head: Normocephalic and atraumatic.  Eyes: Pupils are equal, round, and reactive to light. Conjunctivae are normal.  Neck: Normal range of motion.  Cardiovascular: Normal heart sounds.  Respiratory: Effort normal.  GI: Soft.  Musculoskeletal: Normal range of motion.  Neurological: He is alert.  Skin: Skin is warm and dry.  Psychiatric: His affect is blunt. His speech is delayed. He is slowed. Thought content is paranoid. Cognition and memory are impaired. He expresses impulsivity. He expresses no homicidal and no suicidal ideation.    Review of Systems  Constitutional: Negative.   HENT: Negative.   Eyes: Negative.   Respiratory: Negative.   Cardiovascular: Negative.    Gastrointestinal: Negative.   Musculoskeletal: Negative.   Skin: Negative.   Neurological: Negative.   Psychiatric/Behavioral: Negative.     Blood pressure (!) 148/82, pulse 92, temperature 98.3 F (36.8 C), temperature source Oral, resp. rate 20, height 5\' 10"  (1.778 m), weight 113.4 kg, SpO2 98 %.Body mass index is 35.87 kg/m.  General Appearance: Disheveled  Eye Contact:  Minimal  Speech:  Slow  Volume:  Decreased  Mood:  Euthymic  Affect:  Constricted  Thought Process:  Disorganized  Orientation:  Negative  Thought Content:  Illogical  Suicidal Thoughts:  No  Homicidal Thoughts:  No  Memory:  Immediate;   Fair Recent;   Fair Remote;   Fair  Judgement:  Impaired  Insight:  Shallow  Psychomotor Activity:  Decreased  Concentration:  Concentration: Fair  Recall:  [employer]  Fund of Knowledge:  Fair  Language:  Fair  Akathisia:  No  Handed:  Right  AIMS (if indicated):     Assets:  Financial Resources/Insurance Housing Physical Health Resilience  ADL's:  Impaired  Cognition:  Impaired,  Mild  Sleep:   Sleeping majority of day today     Treatment Plan Summary: Rockwell Germanyyrone L Caravello is a 38 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  Encourage water restriction while hospitalized. Labs reviewed and Depakote level not detectable. Restart medications: Increase Depakote ER to 1000 mg daily at bedtime to decrease impulsivity Start Haldol 10 mg daily at bedtime for schizophrenia Discontinue Cogentin as this may be contributing to dry mouth and desire to drink water. Add Benadryl 25 mg daily at bedtime for prevention of EPS Daily contact with patient to assess and evaluate symptoms and progress in treatment and Medication management  Disposition: Recommend psychiatric Inpatient admission when medically cleared. Supportive therapy provided about ongoing  stressors.  Mariel Craft, MD 03/25/2018 3:13 PM

## 2018-03-25 NOTE — ED Notes (Signed)
ED Tech. Attempted to draw lab, and Patient refused at this time.

## 2018-03-25 NOTE — ED Notes (Signed)
Patient is restless on stretcher in hallway, but without any behavior issues at this time, he remains cooperative and calm to staff, will continue to monitor.

## 2018-03-25 NOTE — ED Triage Notes (Signed)
According to EMS, pt was picked up with a history of schizophrenia.  He is a a poor historian.  All he was complaining of is of being thirsty.  Pt noted to be placing his head up under the facet when picked up by EMS.

## 2018-03-25 NOTE — ED Notes (Signed)
Pt given a urine cup to take to the bathroom for a urine sample.  Pt went to the bathroom a urinated with the door opened.  Pt came from the bathroom with the urine sample cup filled with liquid and proceeded to drink the liquid.

## 2018-03-25 NOTE — ED Notes (Signed)
Sent blood draw to lab.

## 2018-03-25 NOTE — H&P (Addendum)
[facility] [place] at [facility]   PATIENT NAME: Shane Nash    MR#:  [id number]  DATE OF BIRTH:  [birth year]  DATE OF ADMISSION:  03/25/2018  PRIMARY CARE PHYSICIAN: Derwood Kaplan, MD   REQUESTING/REFERRING PHYSICIAN: Dr. Juliette Alcide.  CHIEF COMPLAINT:   Chief Complaint  Patient presents with  . Paranoid   Not feeling good. HISTORY OF PRESENT ILLNESS:  Shane Nash  is a 38 y.o. male with a known history of schizophrenia.  He was sent to ED by EMS due to above chief complaint.  Per EMS, the patient was not feeling good and drink a lot of water incessantly from the sink.  He appears very paranoid and confused.  He has a history of polydipsia and hyponatremia.  Sodium is 127 today.  His baseline sodium was normal.  Dr. Juliette Alcide request psych consult and admission. PAST MEDICAL HISTORY:   Past Medical History:  Diagnosis Date  . Schizophrenia (HCC)     PAST SURGICAL HISTORY:  History reviewed. No pertinent surgical history.  SOCIAL HISTORY:   Social History   Tobacco Use  . Smoking status: Current Every Day Smoker    Packs/day: 0.25    Years: 15.00    Pack years: 3.75    Types: Cigarettes  . Smokeless tobacco: Never Used  . Tobacco comment: Cannot state the amount he smokes per day  Substance Use Topics  . Alcohol use: Not on file    FAMILY HISTORY:  History reviewed. No pertinent family history.  DRUG ALLERGIES:  No Known Allergies  REVIEW OF SYSTEMS:   Review of Systems  Unable to perform ROS: Mental status change    MEDICATIONS AT HOME:   Prior to Admission medications   Medication Sig Start Date End Date Taking? Authorizing Provider  benztropine (COGENTIN) 1 MG tablet Take 1 tablet (1 mg total) by mouth 2 (two) times daily. 02/13/17  Yes McNew, Ileene Hutchinson, MD  divalproex (DEPAKOTE ER) 500 MG 24 hr tablet Take 1 tablet (500 mg total) by mouth at bedtime. 02/13/17  Yes McNew, Ileene Hutchinson, MD  haloperidol (HALDOL) 10 MG tablet Take 10 mg  by mouth See admin instructions. Take  tablet (5mg ) by mouth every morning and 1 tablet (10mg ) by mouth every night 03/20/18  Yes [provider]  haloperidol decanoate (HALDOL DECANOATE) 100 MG/ML injection Inject 1.5 mLs (150 mg total) into the muscle every 28 (twenty-eight) days. Next dose due 03/06/17 03/06/17  Yes McNew, Ileene Hutchinson, MD      VITAL SIGNS:  Blood pressure (!) 148/82, pulse 92, temperature 98.3 F (36.8 C), temperature source Oral, resp. rate 20, height 5\' 10"  (1.778 m), weight 113.4 kg, SpO2 98 %.  PHYSICAL EXAMINATION:  Physical Exam  GENERAL:  38 y.o.-year-old patient lying in the bed with no acute distress.  EYES: Pupils equal, round, reactive to light and accommodation. No scleral icterus. Extraocular muscles intact.  HEENT: Head atraumatic, normocephalic. Oropharynx and nasopharynx clear.  NECK:  Supple, no jugular venous distention. No thyroid enlargement, no tenderness.  LUNGS: Normal breath sounds bilaterally, no wheezing, rales,rhonchi or crepitation. No use of accessory muscles of respiration.  CARDIOVASCULAR: S1, S2 normal. No murmurs, rubs, or gallops.  ABDOMEN: Soft, nontender, nondistended. Bowel sounds present. No organomegaly or mass.  EXTREMITIES: No pedal edema, cyanosis, or clubbing.  NEUROLOGIC: Cranial nerves II through XII are intact. Muscle strength 5/5 in all extremities. Sensation intact. Gait not checked.  PSYCHIATRIC: The patient is alert and oriented x  2.  SKIN: No obvious rash, lesion, or ulcer.   LABORATORY PANEL:   CBC Recent Labs  Lab 03/25/18 1331  WBC 9.1  HGB 13.9  HCT 38.5*  PLT 306   ------------------------------------------------------------------------------------------------------------------  Chemistries  Recent Labs  Lab 03/25/18 1331  NA 127*  K 3.8  CL 95*  CO2 21*  GLUCOSE 96  BUN 12  CREATININE 0.86  CALCIUM 8.8*  AST 63*  ALT 24  ALKPHOS 73  BILITOT 1.2    ------------------------------------------------------------------------------------------------------------------  Cardiac Enzymes No results for input(s): TROPONINI in the last 168 hours. ------------------------------------------------------------------------------------------------------------------  RADIOLOGY:  No results found.    IMPRESSION AND PLAN:   Hyponatremia. The patient will be admitted to medical floor. Start normal saline IV, fluid restriction, follow-up sodium level.  Paranoid and suicidal ideation.  History of schizophrenia. Follow-up psych consult, IVC.  Acute metabolic encephalopathy.  Possible due to above. Tobacco abuse.  Smoking cessation was counseled for 3-4 minutes.  Nicotine patch. All the records are reviewed and case discussed with ED provider. Management plans discussed with the patient, family and they are in agreement.  CODE STATUS: Full code.  TOTAL TIME TAKING CARE OF THIS PATIENT: 35 minutes.    Shaune Pollack M.D on 03/25/2018 at 2:47 PM  Between 7am to 6pm - Pager - [phone]  After 6pm go to [email or web address] - Social research officer, government  [facility] [place] [facility]  Office  [phone]  CC: Primary care physician; Derwood Kaplan, MD   Note: This dictation was prepared with Dragon dictation along with smaller phrase technology. Any transcriptional errors that result from this process are unin

## 2018-03-26 ENCOUNTER — Encounter: Payer: Self-pay | Admitting: Psychiatry

## 2018-03-26 ENCOUNTER — Inpatient Hospital Stay
Admission: AD | Admit: 2018-03-26 | Discharge: 2018-04-03 | DRG: 885 | Disposition: A | Discharge status: Home / Self Care | Payer: Medicaid Other | Attending: Psychiatry | Admitting: Psychiatry

## 2018-03-26 DIAGNOSIS — R631 Polydipsia: Secondary | ICD-10-CM | POA: Diagnosis present

## 2018-03-26 DIAGNOSIS — R030 Elevated blood-pressure reading, without diagnosis of hypertension: Secondary | ICD-10-CM | POA: Diagnosis present

## 2018-03-26 DIAGNOSIS — F201 Disorganized schizophrenia: Principal | ICD-10-CM | POA: Diagnosis present

## 2018-03-26 DIAGNOSIS — F17200 Nicotine dependence, unspecified, uncomplicated: Secondary | ICD-10-CM | POA: Diagnosis present

## 2018-03-26 DIAGNOSIS — E871 Hypo-osmolality and hyponatremia: Secondary | ICD-10-CM | POA: Diagnosis present

## 2018-03-26 DIAGNOSIS — A150 Tuberculosis of lung: Secondary | ICD-10-CM

## 2018-03-26 DIAGNOSIS — Z79899 Other long term (current) drug therapy: Secondary | ICD-10-CM | POA: Diagnosis not present

## 2018-03-26 DIAGNOSIS — Z716 Tobacco abuse counseling: Secondary | ICD-10-CM | POA: Diagnosis not present

## 2018-03-26 DIAGNOSIS — F17210 Nicotine dependence, cigarettes, uncomplicated: Secondary | ICD-10-CM | POA: Diagnosis present

## 2018-03-26 LAB — URINALYSIS, COMPLETE (UACMP) WITH MICROSCOPIC
BILIRUBIN URINE: NEGATIVE
Bacteria, UA: NONE SEEN
Glucose, UA: NEGATIVE mg/dL
Hgb urine dipstick: NEGATIVE
Ketones, ur: NEGATIVE mg/dL
Leukocytes,Ua: NEGATIVE
Nitrite: NEGATIVE
PROTEIN: NEGATIVE mg/dL
SPECIFIC GRAVITY, URINE: 1.009 (ref 1.005–1.030)
SQUAMOUS EPITHELIAL / LPF: NONE SEEN (ref 0–5)
pH: 7 (ref 5.0–8.0)

## 2018-03-26 LAB — BASIC METABOLIC PANEL
Anion gap: 9 (ref 5–15)
BUN: 10 mg/dL (ref 6–20)
CALCIUM: 8.6 mg/dL — AB (ref 8.9–10.3)
CO2: 22 mmol/L (ref 22–32)
Chloride: 106 mmol/L (ref 98–111)
Creatinine, Ser: 0.91 mg/dL (ref 0.61–1.24)
GFR calc Af Amer: >60 mL/min (ref >60)
GLUCOSE: 143 mg/dL — AB (ref 70–99)
Potassium: 3.7 mmol/L (ref 3.5–5.1)
Sodium: 137 mmol/L (ref 135–145)

## 2018-03-26 LAB — HEPATIC FUNCTION PANEL
ALT: 33 U/L (ref 0–44)
AST: 145 U/L — ABNORMAL HIGH (ref 15–41)
Albumin: 4.3 g/dL (ref 3.5–5.0)
Alkaline Phosphatase: 74 U/L (ref 38–126)
BILIRUBIN INDIRECT: 0.6 mg/dL (ref 0.3–0.9)
Bilirubin, Direct: 0.1 mg/dL (ref 0.0–0.2)
Total Bilirubin: 0.7 mg/dL (ref 0.3–1.2)
Total Protein: 8.4 g/dL — ABNORMAL HIGH (ref 6.5–8.1)

## 2018-03-26 LAB — URINE DRUG SCREEN, QUALITATIVE (ARMC ONLY)
AMPHETAMINES, UR SCREEN: NOT DETECTED
Barbiturates, Ur Screen: NOT DETECTED
Benzodiazepine, Ur Scrn: NOT DETECTED
Cannabinoid 50 Ng, Ur ~~LOC~~: NOT DETECTED
Cocaine Metabolite,Ur ~~LOC~~: NOT DETECTED
MDMA (ECSTASY) UR SCREEN: NOT DETECTED
Methadone Scn, Ur: NOT DETECTED
Opiate, Ur Screen: NOT DETECTED
Phencyclidine (PCP) Ur S: NOT DETECTED
Tricyclic, Ur Screen: NOT DETECTED

## 2018-03-26 LAB — MAGNESIUM: Magnesium: 2.2 mg/dL (ref 1.7–2.4)

## 2018-03-26 MED ORDER — MAGNESIUM HYDROXIDE 400 MG/5ML PO SUSP: 30.0000 mL | Freq: Every day | ORAL | Status: DC | PRN

## 2018-03-26 MED ORDER — DIPHENHYDRAMINE HCL 25 MG PO CAPS
50.0000 mg | ORAL_CAPSULE | Freq: Every day | ORAL | Status: DC
  Administered 2018-03-26 – 2018-04-02 (×8): 50 mg via ORAL
  Filled 2018-03-26 (×8): qty 2

## 2018-03-26 MED ORDER — ALBUTEROL SULFATE (2.5 MG/3ML) 0.083% IN NEBU: 2.5000 mg | INHALATION_SOLUTION | RESPIRATORY_TRACT | Status: DC | PRN

## 2018-03-26 MED ORDER — DIVALPROEX SODIUM ER 250 MG PO TB24: 500.0000 mg | ORAL_TABLET | Freq: Every day | ORAL | Status: DC

## 2018-03-26 MED ORDER — HYDROCODONE-ACETAMINOPHEN 5-325 MG PO TABS: 1.0000 | ORAL_TABLET | ORAL | Status: DC | PRN

## 2018-03-26 MED ORDER — BENZTROPINE MESYLATE 1 MG PO TABS
1.0000 mg | ORAL_TABLET | Freq: Two times a day (BID) | ORAL | Status: DC
  Administered 2018-03-26: 1 mg via ORAL
  Filled 2018-03-26: qty 1

## 2018-03-26 MED ORDER — SODIUM CHLORIDE 0.9 % IV SOLN: INTRAVENOUS | Status: DC

## 2018-03-26 MED ORDER — ACETAMINOPHEN 325 MG PO TABS: 650.0000 mg | ORAL_TABLET | Freq: Four times a day (QID) | ORAL | Status: DC | PRN

## 2018-03-26 MED ORDER — DIPHENHYDRAMINE HCL 25 MG PO CAPS
25.0000 mg | ORAL_CAPSULE | Freq: Three times a day (TID) | ORAL | Status: DC | PRN
  Filled 2018-03-26 (×3): qty 1

## 2018-03-26 MED ORDER — DIPHENHYDRAMINE HCL 25 MG PO CAPS: 50.0000 mg | ORAL_CAPSULE | Freq: Every day | ORAL | Status: DC

## 2018-03-26 MED ORDER — SENNOSIDES-DOCUSATE SODIUM 8.6-50 MG PO TABS: 1.0000 | ORAL_TABLET | Freq: Every evening | ORAL | Status: DC | PRN

## 2018-03-26 MED ORDER — ONDANSETRON HCL 4 MG/2ML IJ SOLN: 4.0000 mg | Freq: Four times a day (QID) | INTRAMUSCULAR | Status: DC | PRN

## 2018-03-26 MED ORDER — DIVALPROEX SODIUM ER 500 MG PO TB24
500.0000 mg | ORAL_TABLET | Freq: Every day | ORAL | Status: DC
  Administered 2018-03-26: 500 mg via ORAL
  Filled 2018-03-26: qty 1

## 2018-03-26 MED ORDER — HALOPERIDOL 5 MG PO TABS: 10.0000 mg | ORAL_TABLET | ORAL | Status: DC

## 2018-03-26 MED ORDER — ONDANSETRON HCL 4 MG PO TABS: 4.0000 mg | ORAL_TABLET | Freq: Four times a day (QID) | ORAL | Status: DC | PRN

## 2018-03-26 MED ORDER — HALOPERIDOL 10 MG PO TABS: 10.0000 mg | ORAL_TABLET | Freq: Every day | ORAL | Status: DC

## 2018-03-26 MED ORDER — ACETAMINOPHEN 650 MG RE SUPP: 650.0000 mg | Freq: Four times a day (QID) | RECTAL | Status: DC | PRN

## 2018-03-26 MED ORDER — BENZTROPINE MESYLATE 1 MG PO TABS
2.0000 mg | ORAL_TABLET | Freq: Once | ORAL | Status: AC
  Administered 2018-03-26: 2 mg via ORAL
  Filled 2018-03-26: qty 2

## 2018-03-26 MED ORDER — HALOPERIDOL 5 MG PO TABS
10.0000 mg | ORAL_TABLET | Freq: Every day | ORAL | Status: DC
  Administered 2018-03-26 – 2018-04-02 (×8): 10 mg via ORAL
  Filled 2018-03-26 (×8): qty 2

## 2018-03-26 MED ORDER — ALUM & MAG HYDROXIDE-SIMETH 200-200-20 MG/5ML PO SUSP: 30.0000 mL | ORAL | Status: DC | PRN

## 2018-03-26 MED ORDER — ENOXAPARIN SODIUM 40 MG/0.4ML ~~LOC~~ SOLN: 40.0000 mg | SUBCUTANEOUS | Status: DC

## 2018-03-26 MED ORDER — DIVALPROEX SODIUM ER 500 MG PO TB24
1000.0000 mg | ORAL_TABLET | Freq: Every day | ORAL | Status: DC
  Administered 2018-03-26: 1000 mg via ORAL
  Filled 2018-03-26: qty 2

## 2018-03-26 MED ORDER — DIVALPROEX SODIUM ER 500 MG PO TB24: 1000.0000 mg | ORAL_TABLET | Freq: Every day | ORAL | Status: DC

## 2018-03-26 MED ORDER — SENNOSIDES-DOCUSATE SODIUM 8.6-50 MG PO TABS
1.0000 | ORAL_TABLET | Freq: Every evening | ORAL | Status: DC | PRN
  Filled 2018-03-26: qty 1

## 2018-03-26 NOTE — Discharge Summary (Signed)
[employer] - Portal at [facility]   PATIENT NAME: Shane Nash    MR#:  [id number]  DATE OF BIRTH:  January 10, 1981  DATE OF ADMISSION:  03/25/2018 ADMITTING PHYSICIAN: Shaune Pollack, MD  DATE OF DISCHARGE: 03/26/2018  PRIMARY CARE PHYSICIAN: Derwood Kaplan, MD    ADMISSION DIAGNOSIS:  med eval  DISCHARGE DIAGNOSIS:  Principal Problem:   Hyponatremia Active Problems:   Schizophrenia (HCC)   Polydipsia   SECONDARY DIAGNOSIS:   Past Medical History:  Diagnosis Date  . Schizophrenia [facility])     HOSPITAL COURSE:  38 year old male with a history of schizophrenia who came via EMS due to patient stating he was not feeling well and has been drinking water incessantly from the sink.  1.  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 Continue with fluid restriction  2.  Schizophrenia: Patient evaluated by psychiatry: Patient will be admitted to behavioral health for further evaluation. Continue Haldol and Depakote as per psychiatry   DISCHARGE CONDITIONS AND DIET:   Stable Regular doet  CONSULTS OBTAINED:    DRUG ALLERGIES:  No Known Allergies  DISCHARGE MEDICATIONS:   Allergies as of 03/26/2018   No Known Allergies     Medication List    STOP taking these medications   benztropine 1 MG tablet Commonly known as:  COGENTIN   haloperidol decanoate 100 MG/ML injection Commonly known as:  HALDOL DECANOATE     TAKE these medications   divalproex 500 MG 24 hr tablet Commonly known as:  DEPAKOTE ER Take 2 tablets (1,000 mg total) by mouth at bedtime. What changed:  how much to take   haloperidol 10 MG tablet Commonly known as:  HALDOL Take 1 tablet (10 mg total) by mouth at bedtime. What changed:    when to take this  additional instructions         Today   CHIEF COMPLAINT:  No issues patient doing ok this am   VITAL SIGNS:  Blood pressure (!) 152/94, pulse 78, temperature 98.9 F (37.2 C), temperature source Oral,  resp. rate 18, height 5\' 10"  (1.778 m), weight 113.4 kg, SpO2 94 %.   REVIEW OF SYSTEMS:  Review of Systems  Constitutional: Negative.  Negative for chills, fever and malaise/fatigue.  HENT: Negative.  Negative for ear discharge, ear pain, hearing loss, nosebleeds and sore throat.   Eyes: Negative.  Negative for blurred vision and pain.  Respiratory: Negative.  Negative for cough, hemoptysis, shortness of breath and wheezing.   Cardiovascular: Negative.  Negative for chest pain, palpitations and leg swelling.  Gastrointestinal: Negative.  Negative for abdominal pain, blood in stool, diarrhea, nausea and vomiting.  Genitourinary: Negative.  Negative for dysuria.  Musculoskeletal: Negative.  Negative for back pain.  Skin: Negative.   Neurological: Negative for dizziness, tremors, speech change, focal weakness, seizures and headaches.  Endo/Heme/Allergies: Negative.  Does not bruise/bleed easily.  Psychiatric/Behavioral: Negative for depression, hallucinations and suicidal ideas. The patient is nervous/anxious.      PHYSICAL EXAMINATION:  GENERAL:  38 y.o.-year-old patient lying in the bed with no acute distress.  NECK:  Supple, no jugular venous distention. No thyroid enlargement, no tenderness.  LUNGS: Normal breath sounds bilaterally, no wheezing, rales,rhonchi  No use of accessory muscles of respiration.  CARDIOVASCULAR: S1, S2 normal. No murmurs, rubs, or gallops.  ABDOMEN: Soft, non-tender, non-distended. Bowel sounds present. No organomegaly or mass.  EXTREMITIES: No pedal edema, cyanosis, or clubbing.  PSYCHIATRIC: The patient is alert and oriented x 3. schizophremia Flat  affect SKIN: No obvious rash, lesion, or ulcer.   DATA REVIEW:   CBC Recent Labs  Lab 03/25/18 1331  WBC 9.1  HGB 13.9  HCT 38.5*  PLT 306    Chemistries  Recent Labs  Lab 03/25/18 1331 03/26/18 0909  NA 127* 137  K 3.8 3.7  CL 95* 106  CO2 21* 22  GLUCOSE 96 143*  BUN 12 10  CREATININE  0.86 0.91  CALCIUM 8.8* 8.6*  AST 63*  --   ALT 24  --   ALKPHOS 73  --   BILITOT 1.2  --     Cardiac Enzymes No results for input(s): TROPONINI in the last 168 hours.  Microbiology Results  @MICRORSLT48 @  RADIOLOGY:  No results found.    Allergies as of 03/26/2018   No Known Allergies     Medication List    STOP taking these medications   benztropine 1 MG tablet Commonly known as:  COGENTIN   haloperidol decanoate 100 MG/ML injection Commonly known as:  HALDOL DECANOATE     TAKE these medications   divalproex 500 MG 24 hr tablet Commonly known as:  DEPAKOTE ER Take 2 tablets (1,000 mg total) by mouth at bedtime. What changed:  how much to take   haloperidol 10 MG tablet Commonly known as:  HALDOL Take 1 tablet (10 mg total) by mouth at bedtime. What changed:    when to take this  additional instructions         D/w Dr Wilder Glade.   Management plans discussed with the patient and he is in agreement. Stable for discharge behavioral health  Patient [facility] with [facility]  CODE STATUS:     Code Status Orders  (From admission, onward)         Start     Ordered   03/26/18 0052  Full code  Continuous     03/26/18 0052        Code Status History    Date Active Date Inactive Code Status Order ID Comments User Context   01/30/2017 1637 02/14/2017 2018 Full Code [id number]  [name], [name], MD Inpatient      TOTAL TIME TAKING CARE OF THIS PATIENT: 38 minutes.    Note: This dictation was prepared with Dragon dictation along with smaller phrase technology. Any transcriptional errors that result from this process are unintentional.  Ellicia Alix M.D on 03/26/2018 at 11:52 AM  Between 7am to 6pm - Pager - [phone] After 6pm go to [email or web address] - Social research officer, government  [facility]  Office  [phone]  CC: Primary care physician; Derwood Kaplan, MD

## 2018-03-26 NOTE — ED Notes (Signed)
Redness/swelling noted to left hand after pt c/o pain to TTS. Cool compress applied and hand elevated

## 2018-03-26 NOTE — ED Notes (Signed)
Eating saltines and crackers with 120 cc of [place]

## 2018-03-26 NOTE — ED Notes (Signed)
Pt refused IV start. Called and spoke to Dr Allena Katz. New orders received. D/C IV fluids encourage high sodium food. Fluid restriction 1000cc/day

## 2018-03-26 NOTE — BH Assessment (Signed)
Assessment Note  Shane Nash is an 38 y.o. male who presented to the ED via EMS due to not feeling well and experiencing paranoia.    Upon assessment, patient was alert, possibly experiencing dystonia.Shane Nash denied SI/HI or psychosis.  However, he reports he has been previously prescribed haldol and cogentin. It seems that he has not been taking medications recently. The patient reports a history of hospitalizations for mental illness and is not able to identify current treatment providers - first stating he has no ACT team, then stating he does, but he is unable to identify the agency name. He stated his sleep is normal and his weigh is stable, yet "I eat sometimes." During the interview, the patient was staring at his arms and hands stating they haven't been the same since he got an injection earlier.  The patient states "I think I'll get up and go to work" but when asked about his job, he says he is looking for one.  His reports are inconsistent, and according to other staff, this is not his baseline. Patient reports he stopped taking medications because they are "too strong for me."  He reports marijuana use as few as 14 days ago. He states "I smoke [place]."    Diagnosis: Altered Mental Status  Past Medical History:  Past Medical History:  Diagnosis Date  . Schizophrenia (HCC)     History reviewed. No pertinent surgical history.  Family History: History reviewed. No pertinent family history.  Social History:  reports that he has been smoking cigarettes. He has a 3.75 pack-year smoking history. He has never used smokeless tobacco. He reports that he does not use drugs. No history on file for alcohol.  Additional Social History:  Alcohol / Drug Use Pain Medications: See PTA Prescriptions: See PTA Over the Counter: See PTA History of alcohol / drug use?: No history of alcohol / drug abuse  CIWA: CIWA-Ar BP: 136/82 Pulse Rate: (!) 105 COWS:    Allergies: No Known  Allergies  Home Medications: (Not in a hospital admission)   OB/GYN Status:  No LMP for male patient.  General Assessment Data Location of Assessment: [facility] ED TTS Assessment: In system Is this a Tele or Face-to-Face Assessment?: Face-to-Face Is this an Initial Assessment or a Re-assessment for this encounter?: Initial Assessment Patient Accompanied by:: N/A Language Other than English: No Living Arrangements: Other (Comment)(Stays with family and friends) What gender do you identify as?: Male Marital status: Single Pregnancy Status: No Living Arrangements: Other relatives Can pt return to current living arrangement?: Yes Admission Status: Involuntary Is patient capable of signing voluntary admission?: No Referral Source: (EMS) Insurance type: (Medicaid)  Medical Screening Exam [facility] Walk-in ONLY) Medical Exam completed: Yes  Crisis Care Plan Living Arrangements: Other relatives     Risk to self with the past 6 months Suicidal Ideation: No Has patient been a risk to self within the past 6 months prior to admission? : No Suicidal Intent: No Has patient had any suicidal intent within the past 6 months prior to admission? : No Is patient at risk for suicide?: No Suicidal Plan?: No Has patient had any suicidal plan within the past 6 months prior to admission? : No Access to Means: No What has been your use of drugs/alcohol within the last 12 months?: (marijuana 14 days ago) Previous Attempts/Gestures: No Other Self Harm Risks: none Triggers for Past Attempts: None known Intentional Self Injurious Behavior: None Family Suicide History: No Recent stressful life event(s): ( none noted)  Persecutory voices/beliefs?: No Depression: No Substance abuse history and/or treatment for substance abuse?: No Suicide prevention information given to non-admitted patients: Not applicable  Risk to Others within the past 6 months Homicidal Ideation: No Does patient have any lifetime  risk of violence toward others beyond the six months prior to admission? : No Thoughts of Harm to Others: No Current Homicidal Intent: No Current Homicidal Plan: No Access to Homicidal Means: No Identified Victim: (no) History of harm to others?: No Assessment of Violence: None Noted Criminal Charges Pending?: No Does patient have a court date: No Is patient on probation?: No  Psychosis Hallucinations: None noted Delusions: None noted  Mental Status Report Appearance/Hygiene: In scrubs Eye Contact: Fair Motor Activity: Freedom of movement Speech: Slow, Tangential Level of Consciousness: Alert Mood: Ambivalent, Anhedonia Affect: Apathetic, Preoccupied, Flat Anxiety Level: None Thought Processes: Irrelevant, Thought Blocking Judgement: Unable to Assess Orientation: Person, Place, Time, Situation, Appropriate for developmental age Obsessive Compulsive Thoughts/Behaviors: None  Cognitive Functioning Concentration: Unable to Assess Memory: Recent Intact, Remote Intact Is patient IDD: No Insight: Fair Impulse Control: Fair Appetite: Fair("I eat sometimes" ) Have you had any weight changes? : No Change Sleep: No Change  ADLScreening [facility] Assessment Services) Patient's cognitive ability adequate to safely complete daily activities?: Yes Patient able to express need for assistance with ADLs?: Yes Independently performs ADLs?: Yes (appropriate for developmental age)  Prior Inpatient Therapy Prior Inpatient Therapy: Yes Prior Therapy Dates: 2019 Prior Therapy Facilty/Provider(s): [facility] Reason for Treatment: Mental Illness  Prior Outpatient Therapy Prior Outpatient Therapy: Yes Prior Therapy Dates: current Prior Therapy Facilty/Provider(s): unknown  Reason for Treatment: mental illness Does patient have an ACCT team?: Unknown Does patient have Intensive In-House Services?  : Unknown Does patient have [facility] services? : Unknown Does patient have P4CC services?:  Unknown  ADL Screening (condition at time of admission) Patient's cognitive ability adequate to safely complete daily activities?: Yes Is the patient deaf or have difficulty hearing?: No Does the patient have difficulty seeing, even when wearing glasses/contacts?: No Does the patient have difficulty concentrating, remembering, or making decisions?: No Patient able to express need for assistance with ADLs?: Yes Does the patient have difficulty dressing or bathing?: No Independently performs ADLs?: Yes (appropriate for developmental age) Does the patient have difficulty walking or climbing stairs?: No Weakness of Legs: None Weakness of Arms/Hands: None  Home Assistive Devices/Equipment Home Assistive Devices/Equipment: None  Therapy Consults (therapy consults require a physician order) PT Evaluation Needed: No OT Evalulation Needed: No SLP Evaluation Needed: No Abuse/Neglect Assessment (Assessment to be complete while patient is alone) Abuse/Neglect Assessment Can Be Completed: Yes Physical Abuse: Denies Verbal Abuse: Denies Sexual Abuse: Denies Exploitation of patient/patient's resources: Denies Self-Neglect: Denies Values / Beliefs Cultural Requests During Hospitalization: None Spiritual Requests During Hospitalization: None Consults Spiritual Care Consult Needed: No Social Work Consult Needed: No   Nutrition Screen- [facility] Adult/WL/AP Patient's home diet: Regular        Disposition:  Disposition Initial Assessment Completed for this Encounter: Yes  On Site Evaluation by:  Thereasa Distance Becton 03/26/2018 3:51 PM

## 2018-03-26 NOTE — ED Notes (Signed)
Report has been called to inpatient behavioral health to [place], [place]. Confirmed unit is ready to accept patient at this time. VSS prior to pt transport. Off the unit with Nursing assistant and officer via wheelchair at this time.

## 2018-03-26 NOTE — ED Notes (Signed)
Notified Care Manager of ACT team request and ACT team concerns regarding patient's home situation.

## 2018-03-26 NOTE — ED Notes (Signed)
ED TO INPATIENT HANDOFF REPORT  Name/Age/Gender Shane Nash 38 y.o. male  Code Status    Code Status Orders  (From admission, onward)         Start     Ordered   03/26/18 0052  Full code  Continuous     03/26/18 0052        Code Status History    Date Active Date Inactive Code Status Order ID Comments User Context   01/30/2017 1637 02/14/2017 2018 Full Code [id number]  [name], [name], MD Inpatient      Home/SNF/Other Home  Chief Complaint med eval  Level of Care/Admitting Diagnosis ED Disposition    ED Disposition Condition Comment   Admit  Hospital Area: [facility] [[id number]]  Level of Care: Med-Surg [16]  Diagnosis: Hyponatremia [431540]  Admitting Physician: Shaune Pollack [[id number]]  Attending Physician: Shaune Pollack [[id number]]  Estimated length of stay: past midnight tomorrow  Certification:: I certify this patient will need inpatient services for at least 2 midnights  PT Class (Do Not Modify): Inpatient [101]  PT Acc Code (Do Not Modify): Private [1]       Medical History Past Medical History:  Diagnosis Date  . Schizophrenia (HCC)     Allergies No Known Allergies  IV Location/Drains/Wounds Patient Lines/Drains/Airways Status   Active Line/Drains/Airways    None          Labs/Imaging Results for orders placed or performed during the hospital encounter of 03/25/18 (from the past 48 hour(s))  Comprehensive metabolic panel     Status: Abnormal   Collection Time: 03/25/18  1:31 PM  Result Value Ref Range   Sodium 127 (L) 135 - 145 mmol/L   Potassium 3.8 3.5 - 5.1 mmol/L   Chloride 95 (L) 98 - 111 mmol/L   CO2 21 (L) 22 - 32 mmol/L   Glucose, Bld 96 70 - 99 mg/dL   BUN 12 6 - 20 mg/dL   Creatinine, Ser 6.71 0.61 - 1.24 mg/dL   Calcium 8.8 (L) 8.9 - 10.3 mg/dL   Total Protein 7.6 6.5 - 8.1 g/dL   Albumin 4.0 3.5 - 5.0 g/dL   AST 63 (H) 15 - 41 U/L   ALT 24 0 - 44 U/L   Alkaline Phosphatase 73 38 - 126 U/L   Total  Bilirubin 1.2 0.3 - 1.2 mg/dL   GFR calc non Af Amer >60 >60 mL/min   GFR calc Af Amer >60 >60 mL/min   Anion gap 11 5 - 15    Comment: Performed at [facility], [address], [place], [place] [address]  Ethanol     Status: None   Collection Time: 03/25/18  1:31 PM  Result Value Ref Range   Alcohol, Ethyl (B) <10 <10 mg/dL    Comment: (NOTE) Lowest detectable limit for serum alcohol is 10 mg/dL. For medical purposes only. Performed at [facility], [address] [place], [place] [address]   Salicylate level     Status: None   Collection Time: 03/25/18  1:31 PM  Result Value Ref Range   Salicylate Lvl <7.0 2.8 - 30.0 mg/dL    Comment: Performed at [facility], [address] [place], [place] [address]  Acetaminophen level     Status: Abnormal   Collection Time: 03/25/18  1:31 PM  Result Value Ref Range   Acetaminophen (Tylenol), Serum <10 (L) 10 - 30 ug/mL    Comment: (NOTE) Therapeutic concentrations vary significantly. A range  of 10-30 ug/mL  may be an effective concentration for many patients. However, some  are best treated at concentrations outside of this range. Acetaminophen concentrations >150 ug/mL at 4 hours after ingestion  and >50 ug/mL at 12 hours after ingestion are often associated with  toxic reactions. Performed at [facility], [address] [place], [place] [address]   cbc     Status: Abnormal   Collection Time: 03/25/18  1:31 PM  Result Value Ref Range   WBC 9.1 4.0 - 10.5 K/uL   RBC 4.41 4.22 - 5.81 MIL/uL   Hemoglobin 13.9 13.0 - 17.0 g/dL   HCT 19.1 (L) 47.8 - 29.5 %   MCV 87.3 80.0 - 100.0 fL   MCH 31.5 26.0 - 34.0 pg   MCHC 36.1 (H) 30.0 - 36.0 g/dL   RDW 62.1 30.8 - 65.7 %   Platelets 306 150 - 400 K/uL   nRBC 0.0 0.0 - 0.2 %    Comment: Performed at [facility], [address] [place], [place] [address]  Valproic acid level     Status: Abnormal   Collection Time: 03/25/18  1:31 PM   Result Value Ref Range   Valproic Acid Lvl <10 (L) 50.0 - 100.0 ug/mL    Comment: Performed at [facility], [address] [place], [place] [address]  Basic metabolic panel     Status: Abnormal   Collection Time: 03/26/18  9:09 AM  Result Value Ref Range   Sodium 137 135 - 145 mmol/L    Comment: RESULTS VERIFIED BY REPEAT TESTING [facility]   Potassium 3.7 3.5 - 5.1 mmol/L   Chloride 106 98 - 111 mmol/L   CO2 22 22 - 32 mmol/L   Glucose, Bld 143 (H) 70 - 99 mg/dL   BUN 10 6 - 20 mg/dL   Creatinine, Ser 4.13 0.61 - 1.24 mg/dL   Calcium 8.6 (L) 8.9 - 10.3 mg/dL   GFR calc non Af Amer >60 >60 mL/min   GFR calc Af Amer >60 >60 mL/min   Anion gap 9 5 - 15    Comment: Performed at [facility], [address], [place], [place] [address]   No results found.  Pending Labs Unresulted Labs (From admission, onward)    Start     Ordered   04/01/18 0500  Creatinine, serum  (enoxaparin (LOVENOX)    CrCl >/= 30 ml/min)  Weekly,   STAT    Comments:  while on enoxaparin therapy    03/26/18 0052   03/27/18 0500  Basic metabolic panel  Tomorrow morning,   STAT     03/26/18 0837   03/26/18 0052  HIV antibody (Routine Testing)  Once,   STAT     03/26/18 0052   03/26/18 0052  Magnesium  Add-on,   AD     03/26/18 0052   03/25/18 1053  Urine Drug Screen, Qualitative  Once,   STAT     03/25/18 1053   03/25/18 1007  Urine Drug Screen, Qualitative  Once,   STAT     03/25/18 1006   03/25/18 1006  Basic metabolic panel  Once,   STAT     03/25/18 1005   03/25/18 1006  Hepatic function panel  Once,   STAT     03/25/18 1005   03/25/18 1006  CBC with Differential  Once,   STAT     03/25/18 1005   03/25/18 1006  Urinalysis, Complete w Microscopic  ONCE -  STAT,   STAT     03/25/18 1005          Vitals/Pain Today's Vitals   03/26/18 0705 03/26/18 0810 03/26/18 0911 03/26/18 1044  BP:   (!) 152/94   Pulse:   78   Resp:   18   Temp:      TempSrc:      SpO2:   94%   Weight:       Height:      PainSc: 0-No pain 0-No pain 0-No pain Asleep    Isolation Precautions No active isolations  Medications Medications  acetaminophen (TYLENOL) tablet 650 mg (has no administration in time range)    Or  acetaminophen (TYLENOL) suppository 650 mg (has no administration in time range)  ondansetron (ZOFRAN) tablet 4 mg (has no administration in time range)    Or  ondansetron (ZOFRAN) injection 4 mg (has no administration in time range)  albuterol (PROVENTIL) (2.5 MG/3ML) 0.083% nebulizer solution 2.5 mg (has no administration in time range)  enoxaparin (LOVENOX) injection 40 mg (40 mg Subcutaneous Not Given 03/26/18 0127)  HYDROcodone-acetaminophen (NORCO/VICODIN) 5-325 MG per tablet 1-2 tablet (has no administration in time range)  senna-docusate (Senokot-S) tablet 1 tablet (has no administration in time range)  divalproex (DEPAKOTE ER) 24 hr tablet 500 mg (500 mg Oral Not Given 03/26/18 0126)  divalproex (DEPAKOTE ER) 24 hr tablet 1,000 mg (1,000 mg Oral Given 03/25/18 2202)  haloperidol (HALDOL) tablet 10 mg (10 mg Oral Given 03/25/18 2016)  diphenhydrAMINE (BENADRYL) capsule 25 mg (25 mg Oral Given 03/25/18 2016)  haloperidol (HALDOL) tablet 10 mg (10 mg Oral Given 03/25/18 1107)    Mobility Self

## 2018-03-26 NOTE — BH Assessment (Signed)
Patient is to be admitted to [facility] by Dr. Viviano Simas.  Attending Physician will be Dr. Jennet Maduro.   Patient has been assigned to room 314, by [facility] Charge Nurse T'Yawn.   Intake Paper Work has been signed and placed on patient chart.  ER staff is aware of the admission:  Drinda Butts, ER Secretary    Dr. Scotty Court, ER MD   Idalia Needle, Patient's Nurse   Ivin Booty, Patient Access.

## 2018-03-26 NOTE — ED Notes (Signed)
ACT Team member: Lenice Llamas [phone]  Crisis Line: [phone]  Request to contact ACT team to assist with care coordination for discharge.  Concerns with living situation.  Patient currently lives with his mother but no family member is stable.  Patient is his own guardian.

## 2018-03-26 NOTE — ED Notes (Signed)
Psychiatrist in room to assess patient.  Will continue to monitor.

## 2018-03-26 NOTE — ED Notes (Signed)
Patient in bathroom at this time.
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
## 2018-03-26 NOTE — ED Notes (Signed)
Report received from [place], RN. previous RN has given handoff to inpatient behavioral health RN prior to shift change. Pt awaits transport to unit after protected time.

## 2018-03-26 NOTE — BHH Suicide Risk Assessment (Addendum)
[facility] Admission Suicide Risk Assessment   Nursing information obtained from:    Demographic factors:  Male, Low socioeconomic status Current Mental Status:  NA Loss Factors:  NA Historical Factors:  Impulsivity Risk Reduction Factors:  Sense of responsibility to family, Living with another person, especially a relative  Total Time spent with patient: 1 hour Principal Problem: Disorganized schizophrenia (HCC) Diagnosis:  Principal Problem:   Disorganized schizophrenia (HCC) Active Problems:   Hyponatremia   Polydipsia   Tobacco use disorder  Subjective Data: altered mental status  Continued Clinical Symptoms:    The "Alcohol Use Disorders Identification Test", Guidelines for Use in Primary Care, Second Edition.  World Science writer [facility]). Score between 0-7:  no or low risk or alcohol related problems. Score between 8-15:  moderate risk of alcohol related problems. Score between 16-19:  high risk of alcohol related problems. Score 20 or above:  warrants further diagnostic evaluation for alcohol dependence and treatment.   CLINICAL FACTORS:   Schizophrenia:   Less than 67 years old Paranoid or undifferentiated type   Musculoskeletal: Strength & Muscle Tone: within normal limits Gait & Station: normal Patient leans: N/A  Psychiatric Specialty Exam: Physical Exam  Nursing note and vitals reviewed. Psychiatric: His speech is normal. Thought content normal. His affect is blunt. He is withdrawn. Cognition and memory are impaired. He expresses impulsivity.    Review of Systems  Neurological: Negative.   Psychiatric/Behavioral: Negative.   All other systems reviewed and are negative.   Blood pressure (!) 169/104, pulse 66, temperature 98.8 F (37.1 C), temperature source Oral, resp. rate 18, SpO2 95 %.There is no height or weight on file to calculate BMI.  General Appearance: Disheveled  Eye Contact:  Good  Speech:  Clear and Coherent  Volume:  Normal  Mood:   Euthymic  Affect:  Blunt  Thought Process:  Goal Directed and Descriptions of Associations: Intact  Orientation:  Full (Time, Place, and Person)  Thought Content:  WDL  Suicidal Thoughts:  No  Homicidal Thoughts:  No  Memory:  Immediate;   Poor Recent;   Poor Remote;   Poor  Judgement:  Poor  Insight:  Shallow  Psychomotor Activity:  Decreased  Concentration:  Concentration: Poor and Attention Span: Poor  Recall:  Poor  Fund of Knowledge:  Poor  Language:  Poor  Akathisia:  No  Handed:  Right  AIMS (if indicated):     Assets:  Communication Skills Desire for Improvement Financial Resources/Insurance Housing Physical Health Resilience Social Support  ADL's:  Intact  Cognition:  WNL  Sleep:  Number of Hours: 4.5      COGNITIVE FEATURES THAT CONTRIBUTE TO RISK:  None    SUICIDE RISK:   Minimal: No identifiable suicidal ideation.  Patients presenting with no risk factors but with morbid ruminations; may be classified as minimal risk based on the severity of the depressive symptoms  PLAN OF CARE: hospital admission, medication management, discharge planning.  Shane Nash is a 38 year old male with a history of schizophrenia admitted for altered mental status with hyponatremia and polydipsia.   #Psychosis -continue Haldol 10 mg nightly -Haldol decanoate 150 mg every 28 days, apparently, received last injection 2 weeks ago -polydipsia resolved  #Mood -Depakote 1000 mg daily, VPA level on admission low, last night his AST doubled -we will hold Depakote as antiepileptics often contribute to hyponatremia, will monitor  #Hyponatremia -resolved  #Elevated blood pressure -will monitor  #Smoking cessation -nicotine patch is available  #Lab -lipid panel, TSH,  A1C -EKG  #Disposition -will return to his group home -follow up with [facility] ACT team     I certify that inpatient services furnished can reasonably be expected to improve the patient's condition.    Kristine Linea, MD 03/27/2018, 11:17 AM

## 2018-03-26 NOTE — ED Notes (Signed)
Pt refuses blood draw at this time. Agrees to do labs with breakfast

## 2018-03-27 ENCOUNTER — Other Ambulatory Visit: Payer: Self-pay

## 2018-03-27 DIAGNOSIS — F201 Disorganized schizophrenia: Principal | ICD-10-CM

## 2018-03-27 LAB — BASIC METABOLIC PANEL
ANION GAP: 10 (ref 5–15)
BUN: 14 mg/dL (ref 6–20)
CO2: 25 mmol/L (ref 22–32)
Calcium: 9.4 mg/dL (ref 8.9–10.3)
Chloride: 103 mmol/L (ref 98–111)
Creatinine, Ser: 1.01 mg/dL (ref 0.61–1.24)
GFR calc Af Amer: >60 mL/min (ref >60)
GFR calc non Af Amer: >60 mL/min (ref >60)
Glucose, Bld: 102 mg/dL — ABNORMAL HIGH (ref 70–99)
Potassium: 4 mmol/L (ref 3.5–5.1)
Sodium: 138 mmol/L (ref 135–145)

## 2018-03-27 LAB — HEMOGLOBIN A1C
Hgb A1c MFr Bld: 5.2 % (ref 4.8–5.6)
Mean Plasma Glucose: 102.54 mg/dL

## 2018-03-27 LAB — LIPID PANEL
Cholesterol: 193 mg/dL (ref 0–200)
HDL: 51 mg/dL (ref >40)
LDL Cholesterol: 111 mg/dL — ABNORMAL HIGH (ref 0–99)
Total CHOL/HDL Ratio: 3.8 RATIO
Triglycerides: 154 mg/dL — ABNORMAL HIGH (ref <150)
VLDL: 31 mg/dL (ref 0–40)

## 2018-03-27 LAB — TSH: TSH: 2.043 u[IU]/mL (ref 0.350–4.500)

## 2018-03-27 MED ORDER — HALOPERIDOL DECANOATE 100 MG/ML IM SOLN
150.0000 mg | INTRAMUSCULAR | Status: DC
  Administered 2018-03-30: 150 mg via INTRAMUSCULAR
  Filled 2018-03-27 (×2): qty 1.5

## 2018-03-27 NOTE — Plan of Care (Signed)
Pt. Denies si/hi/avh, able to contract for safety when asked. Pt. Monitored for safety on the unit.   Problem: Safety: Goal: Ability to disclose and discuss suicidal ideas will improve Outcome: Progressing

## 2018-03-27 NOTE — BHH Counselor (Signed)
Adult Comprehensive Assessment  Patient ID: Shane Nash, male   DOB: [birth year], 38 y.o.   MRN: [id number]  Information Source: Information source: Patient  Current Stressors:  Patient states their primary concerns and needs for treatment are:: Pt reports "messing around doing the wrong things".  Patient states their goals for this hospitilization and ongoing recovery are:: Pt reports "get out and get my own place"  Living/Environment/Situation:  Living Arrangements: Parent Living conditions (as described by patient or guardian): Pt reports that he shares a room at a boarding house with his mother.  Who else lives in the home?: Mother How long has patient lived in current situation?: Pt reports "couple months".  What is atmosphere in current home: Comfortable  Family History:  Marital status: Single Are you sexually active?: No What is your sexual orientation?: Heterosexual  Has your sexual activity been affected by drugs, alcohol, medication, or emotional stress?: Pt denies. Does patient have children?: No  Childhood History:  By whom was/is the patient raised?: Both parents Description of patient's relationship with caregiver when they were a child: Pt reports "it was all right".   Patient's description of current relationship with people who raised him/her: Pt reports "it's still all right".   How were you disciplined when you got in trouble as a child/adolescent?: Pt reports "I stuck my nose in the corner".  Does patient have siblings?: Yes Number of Siblings: 5 Description of patient's current relationship with siblings: Pt reports that he has quite a few siblings and he is not sure how many exactly, however, reports that he has a good relationship. Did patient suffer any verbal/emotional/physical/sexual abuse as a child?: No Did patient suffer from severe childhood neglect?: No Has patient ever been sexually abused/assaulted/raped as an adolescent or adult?: No Was the  patient ever a victim of a crime or a disaster?: No Witnessed domestic violence?: No Has patient been effected by domestic violence as an adult?: No  Education:  Highest grade of school patient has completed: 9th Currently a student?: No Learning disability?: No  Employment/Work Situation:   Employment situation: On disability Why is patient on disability: Pt reports that he is not sure.  How long has patient been on disability: Pt reports "years". Did You Receive Any Psychiatric Treatment/Services While in the Military?: No Are There Guns or Other Weapons in Your Home?: No  Financial Resources:   Financial resources: [employer], Support from parents / caregiver, Medicaid Does patient have a Lawyer or guardian?: No  Alcohol/Substance Abuse:   What has been your use of drugs/alcohol within the last 12 months?: Pt denied.  Alcohol/Substance Abuse Treatment Hx: Denies past history  Social Support System:   Patient's Community Support System: Good Describe Community Support System: Pt reprots "mom". Type of faith/religion: Pt reports he is religious. How does patient's faith help to cope with current illness?: Pt reports "I believe in God, that's it."  Leisure/Recreation:   Leisure and Hobbies: Pt reports "TV and shoot ball"  Strengths/Needs:   What is the patient's perception of their strengths?: Pt reports "I don't know." Patient states these barriers may affect/interfere with their treatment: Pt denies.  Patient states these barriers may affect their return to the community: Pt denies.  Discharge Plan:   Currently receiving community mental health services: Yes (From Whom)([facility] ACTT team) Patient states concerns and preferences for aftercare planning are: Pt has ACTT team. Patient states they will know when they are safe and ready for discharge when: Pt  reports "I know I'm ready". Does patient have access to transportation?: Yes Does patient have financial  barriers related to discharge medications?: No  Summary/Recommendations:   Summary and Recommendations (to be completed by the evaluator): Patient is a 38 year old single male living [facility] [place] [place] [facility]  Pt reports that he is currently unemployed and receives.  Patient also reports that he has Medicaid.  He presents to the hospital with the symptoms of paranoia and in need of medication stabilization.  He has a primary diagnosis of Schizophrenia.  Recommendations for patient include crisis stabilization, therapeutic milieu, encourage group attendance and participation, medication management for detox/mood stabilization and development of comprehensive mental wellness/sobriety plan.  [name]. 03/27/2018

## 2018-03-27 NOTE — BHH Suicide Risk Assessment (Signed)
BHH INPATIENT:  Family/Significant Other Suicide Prevention Education  Suicide Prevention Education:  Contact Attempts: Surafel Bulkley, sister, [phone] has been identified by the patient as the family member/significant other with whom the patient will be residing, and identified as the person(s) who will aid the patient in the event of a mental health crisis.  With written consent from the patient, two attempts were made to provide suicide prevention education, prior to and/or following the patient's discharge.  We were unsuccessful in providing suicide prevention education.  A suicide education pamphlet was given to the patient to share with family/significant other.  Date and time of first attempt: 03/27/2018 at 11:40AM Date and time of second attempt: Second attempt is needed.  CSW notes that the number provided is not the number indicated in chart.  CSW also notes, that when dialed, the number provided by the patient, comes back as disconnected.    [name] 03/27/2018, 11:42 AM

## 2018-03-27 NOTE — Plan of Care (Signed)
Patient admitted from ED, he is bizzare, unable to tell writer what brought him into the hospital. His appetite is enormous constantly coming to nurses station asking for food. He is flat on approach but cooperative.

## 2018-03-27 NOTE — Progress Notes (Addendum)
D: Pt during assessments denies SI/HI/AVH, can contract for safety. Pt is pleasant and cooperative, engages with this Clinical research associate some. Pt. Denies pain or discomfort when asked. Pt. Thought process is disorganized and his presentation is bizarre and disheveled.   A: Q x 15 minute observation checks to be completed for safety. Patient was provided with education.  Patient was given/offered medications per orders. Patient was encourage to attend groups, participate in unit activities and continue with plan of care. Pt. Chart and plans of care reviewed. Pt. Given support and encouragement.   R: Patient is refused vital signs this morning, but later in the day more complaint and vital signs able to be obtained and relayed to the provider.

## 2018-03-27 NOTE — H&P (Addendum)
Psychiatric Admission Assessment Adult  Patient Identification: Shane Nash MRN:  [id number] Date of Evaluation:  03/27/2018 Chief Complaint:  paranoia Principal Diagnosis: Disorganized schizophrenia (HCC) Diagnosis:  Principal Problem:   Disorganized schizophrenia (HCC) Active Problems:   Hyponatremia   Polydipsia   Tobacco use disorder  History of Present Illness:   Identifying data. Shane Nash is a 38 year old male with a history of schizophrenia.  Chief complaint. "I feel good."  History of present illness. Information was obtained from the patient and the chart. The patient called EMS complaining of feeling thirsty and drinking water all the time, not feeling good. Not feeling good. Indeed, in the ER his sodium was low and had to be stabilized by medical team. In the ER he was very confused unable to answer simple questions. This confusion persisted until today. H is able to meet with treatment team and provide some information: he is still with [facility] ACT team, received Haldol decanoate injection 2 weeks ago.He also admits that he stopped taking medications because they were "too strong for him".  I have known the patient for several years. He usually does well on a combination of antipsychotic and Depakote. There were episodes of polydipsia and hyponatremia in the past. His VPA level on admission was low so it is hard to guess ife there was any effect of antiepileptics on sodium.  The patient himself, denies any problems now since excessive drinking resolved.  Past psychiatric history. Diagnosed in his twenties. He rarerly ends up in the hospital.   Family psychiatric history. None.  Social history. No longer lives in a group home but with his mother at a boarding house. They are looking for an apartment.  Total Time spent with patient: 1 hour  Is the patient at risk to self? No.  Has the patient been a risk to self in the past 6 months? No.  Has the patient been a risk  to self within the distant past? No.  Is the patient a risk to others? No.  Has the patient been a risk to others in the past 6 months? No.  Has the patient been a risk to others within the distant past? No.   Prior Inpatient Therapy:   Prior Outpatient Therapy:    Alcohol Screening: Patient refused Alcohol Screening Tool: Yes Alcohol Brief Interventions/Follow-up: Patient Refused Substance Abuse History in the last 12 months:  Yes.   Consequences of Substance Abuse: Negative Previous Psychotropic Medications: Yes  Psychological Evaluations: No  Past Medical History:  Past Medical History:  Diagnosis Date  . Schizophrenia (HCC)    History reviewed. No pertinent surgical history. Family History: History reviewed. No pertinent family history.  Tobacco Screening: Have you used any form of tobacco in the last 30 days? (Cigarettes, Smokeless Tobacco, Cigars, and/or Pipes): Yes Tobacco use, Select all that apply: 5 or more cigarettes per day Are you interested in Tobacco Cessation Medications?: No, patient refused Counseled patient on smoking cessation including recognizing danger situations, developing coping skills and basic information about quitting provided: Refused/Declined practical counseling Social History:  Social History   Substance and Sexual Activity  Alcohol Use Not on file     Social History   Substance and Sexual Activity  Drug Use No    Additional Social History:                           Allergies:  No Known Allergies Lab Results:  Results for orders  placed or performed during the hospital encounter of 03/26/18 (from the past 48 hour(s))  Hepatic function panel     Status: Abnormal   Collection Time: 03/26/18  9:07 PM  Result Value Ref Range   Total Protein 8.4 (H) 6.5 - 8.1 g/dL   Albumin 4.3 3.5 - 5.0 g/dL   AST 875 (H) 15 - 41 U/L   ALT 33 0 - 44 U/L   Alkaline Phosphatase 74 38 - 126 U/L   Total Bilirubin 0.7 0.3 - 1.2 mg/dL   Bilirubin,  Direct 0.1 0.0 - 0.2 mg/dL   Indirect Bilirubin 0.6 0.3 - 0.9 mg/dL    Comment: Performed at [facility], [address] [place], [place] [address]  Basic metabolic panel     Status: Abnormal   Collection Time: 03/27/18 10:34 AM  Result Value Ref Range   Sodium 138 135 - 145 mmol/L   Potassium 4.0 3.5 - 5.1 mmol/L   Chloride 103 98 - 111 mmol/L   CO2 25 22 - 32 mmol/L   Glucose, Bld 102 (H) 70 - 99 mg/dL   BUN 14 6 - 20 mg/dL   Creatinine, Ser 9.51 0.61 - 1.24 mg/dL   Calcium 9.4 8.9 - 88.4 mg/dL   GFR calc non Af Amer >60 >60 mL/min   GFR calc Af Amer >60 >60 mL/min   Anion gap 10 5 - 15    Comment: Performed at [facility], [address] [place], [place] [address]    Blood Alcohol level:  Lab Results  Component Value Date   [facility] <10 03/25/2018   ETH <10 01/27/2017    Metabolic Disorder Labs:  Lab Results  Component Value Date   HGBA1C 5.2 01/31/2017   MPG 102.54 01/31/2017   No results found for: PROLACTIN Lab Results  Component Value Date   CHOL 160 01/31/2017   TRIG 95 01/31/2017   HDL 39 (L) 01/31/2017   CHOLHDL 4.1 01/31/2017   VLDL 19 01/31/2017   LDLCALC 102 (H) 01/31/2017    Current Medications: Current Facility-Administered Medications  Medication Dose Route Frequency Provider Last Rate Last Dose  . acetaminophen (TYLENOL) tablet 650 mg  650 mg Oral Q6H PRN Mariel Craft, MD      . albuterol (PROVENTIL) (2.5 MG/3ML) 0.083% nebulizer solution 2.5 mg  2.5 mg Nebulization Q2H PRN Mariel Craft, MD      . alum & mag hydroxide-simeth (MAALOX/MYLANTA) 200-200-20 MG/5ML suspension 30 mL  30 mL Oral Q4H PRN Mariel Craft, MD      . diphenhydrAMINE (BENADRYL) capsule 25 mg  25 mg Oral Q8H PRN Mariel Craft, MD      . diphenhydrAMINE (BENADRYL) capsule 50 mg  50 mg Oral QHS Mariel Craft, MD   50 mg at 03/26/18 2110  . haloperidol (HALDOL) tablet 10 mg  10 mg Oral QHS Mariel Craft, MD   10 mg at 03/26/18 2111  . magnesium  hydroxide (MILK OF MAGNESIA) suspension 30 mL  30 mL Oral Daily PRN Mariel Craft, MD      . senna-docusate (Senokot-S) tablet 1 tablet  1 tablet Oral QHS PRN Mariel Craft, MD       PTA Medications: Medications Prior to Admission  Medication Sig Dispense Refill Last Dose  . divalproex (DEPAKOTE ER) 500 MG 24 hr tablet Take 2 tablets (1,000 mg total) by mouth at bedtime.     . haloperidol (HALDOL) 10 MG tablet Take 1 tablet (10  mg total) by mouth at bedtime.       Musculoskeletal: Strength & Muscle Tone: within normal limits Gait & Station: normal Patient leans: N/A  Psychiatric Specialty Exam: I reviewed physical examia Physical Exam  Nursing note and vitals reviewed. Constitutional: He is oriented to person, place, and time. He appears well-developed and well-nourished.  HENT:  Head: Normocephalic and atraumatic.  Eyes: Pupils are equal, round, and reactive to light. Conjunctivae and EOM are normal.  Neck: Normal range of motion. Neck supple.  Cardiovascular: Normal rate, regular rhythm and normal heart sounds.  Respiratory: Effort normal and breath sounds normal.  GI: Soft. Bowel sounds are normal.  Musculoskeletal: Normal range of motion.  Neurological: He is alert and oriented to person, place, and time.  Skin: Skin is warm and dry.  Psychiatric: Thought content normal. His affect is blunt. His speech is delayed. He is slowed and withdrawn. Cognition and memory are impaired. He expresses impulsivity.    Review of Systems  Neurological: Negative.   Psychiatric/Behavioral: Negative.   All other systems reviewed and are negative.   Blood pressure (!) 169/104, pulse 66, temperature 98.8 F (37.1 C), temperature source Oral, resp. rate 18, SpO2 95 %.There is no height or weight on file to calculate BMI.  See SRA                                                  Sleep:  Number of Hours: 4.5    Treatment Plan Summary: Daily contact with patient to  assess and evaluate symptoms and progress in treatment and Medication management   Mr. Claborn is a 38 year old male with a history of schizophrenia admitted for altered mental status with hyponatremia and polydipsia.   #Psychosis -continue Haldol 10 mg nightly -Haldol decanoate 150 mg every 28 days, last injection on 03/01/2018, due on 03/30/2018  -polydipsia resolved  #Mood -Depakote 1000 mg daily, VPA level on admission low, last night his AST doubled -we will hold Depakote as antiepileptics often contribute to hyponatremia, will monitor  #Hyponatremia -resolved  #Elevated blood pressure -will monitor  #Smoking cessation -nicotine patch is available  #Lab -lipid panel, TSH, A1C -EKG  #Disposition -will return to his group home -follow up with [facility] ACT team   Observation Level/Precautions:  15 minute checks  Laboratory:  CBC Chemistry Profile UDS UA  Psychotherapy:    Medications:    Consultations:    Discharge Concerns:    Estimated LOS:  Other:     Physician Treatment Plan for Primary Diagnosis: Disorganized schizophrenia (HCC) Long Term Goal(s): Improvement in symptoms so as ready for discharge  Short Term Goals: Ability to identify changes in lifestyle to reduce recurrence of condition will improve, Ability to verbalize feelings will improve, Ability to disclose and discuss suicidal ideas, Ability to demonstrate self-control will improve, Ability to identify and develop effective coping behaviors will improve, Ability to maintain clinical measurements within normal limits will improve, Compliance with prescribed medications will improve and Ability to identify triggers associated with substance abuse/mental health issues will improve  Physician Treatment Plan for Secondary Diagnosis: Principal Problem:   Disorganized schizophrenia (HCC) Active Problems:   Hyponatremia   Polydipsia   Tobacco use disorder  Long Term Goal(s): Improvement in symptoms so as ready  for discharge  Short Term Goals: NA  I certify that inpatient services furnished can  reasonably be expected to improve the patient's condition.    Kristine Linea, MD 2/28/202011:08 AM

## 2018-03-27 NOTE — Progress Notes (Signed)
Patient this morning reportedly moving very rapidly during morning vital, so unable to obtain true vital signs. This Clinical research associate and staff this morning attempted to re-gather vital signs, but patient refused and visibly agitated.

## 2018-03-27 NOTE — [facility] Suicide Risk Assessment (Signed)
[facility] INPATIENT:  Family/Significant Other Suicide Prevention Education  Suicide Prevention Education:  Education Completed; Berenice Bouton, [phone]. sister has been identified by the patient as the family member/significant other with whom the patient will be residing, and identified as the person(s) who will aid the patient in the event of a mental health crisis (suicidal ideations/suicide attempt).  With written consent from the patient, the family member/significant other has been provided the following suicide prevention education, prior to the and/or following the discharge of the patient.  The suicide prevention education provided includes the following:  Suicide risk factors  Suicide prevention and interventions  National Suicide Hotline telephone number  [facility] assessment telephone number  [facility] Emergency Assistance 911  [facility] and/or Residential Mobile Crisis Unit telephone number  Request made of family/significant other to:  Remove weapons (e.g., guns, rifles, knives), all items previously/currently identified as safety concern.    Remove drugs/medications (over-the-counter, prescriptions, illicit drugs), all items previously/currently identified as a safety concern.  The family member/significant other verbalizes understanding of the suicide prevention education information provided.  The family member/significant other agrees to remove the items of safety concern listed above.    [name] 03/27/2018, 11:51 AM

## 2018-03-27 NOTE — [facility] Suicide Risk Assessment (Signed)
CSW called the patient's ACTT team, with [facility] [phone] and spoke with Delphina.  CSW let team know that the patient was on the BMU.    CSW asked about last Haldol injection and was informed that he last received his injection on February 2nd.  CSW was informed that the patient next injection is due on March 2nd.  Penni Homans, MSW, LCSW 03/27/2018 11:49 AM

## 2018-03-27 NOTE — Progress Notes (Signed)
Patient able to sit more still and vital signs able to be taken. MD notified of results. No new orders. Will continue to monitor for safety.

## 2018-03-27 NOTE — [facility] Suicide Risk Assessment (Signed)
[facility] INPATIENT:  Family/Significant Other Suicide Prevention Education  Suicide Prevention Education:  Patient Refusal for Family/Significant Other Suicide Prevention Education: The patient [name] has refused to provide written consent for family/significant other to be provided Family/Significant Other Suicide Prevention Education during admission and/or prior to discharge.  Physician notified.  SPE completed with pt, as pt refused to consent to family contact. SPI pamphlet provided to pt and pt was encouraged to share information with support network, ask questions, and talk about any concerns relating to SPE. Pt denies access to guns/firearms and verbalized understanding of information provided. Mobile Crisis information also provided to pt.   [name] 03/27/2018, 10:29 AM

## 2018-03-27 NOTE — Tx Team (Addendum)
Interdisciplinary Treatment and Diagnostic Plan Update  03/27/2018 Time of Session: 10:30AM Shane Nash MRN: [id number]  Principal Diagnosis: Disorganized schizophrenia [facility])  Secondary Diagnoses: Principal Problem:   Disorganized schizophrenia (HCC) Active Problems:   Hyponatremia   Polydipsia   Tobacco use disorder   Current Medications:  Current Facility-Administered Medications  Medication Dose Route Frequency Provider Last Rate Last Dose  . acetaminophen (TYLENOL) tablet 650 mg  650 mg Oral Q6H PRN Mariel Craft, MD      . albuterol (PROVENTIL) (2.5 MG/3ML) 0.083% nebulizer solution 2.5 mg  2.5 mg Nebulization Q2H PRN Mariel Craft, MD      . alum & mag hydroxide-simeth (MAALOX/MYLANTA) 200-200-20 MG/5ML suspension 30 mL  30 mL Oral Q4H PRN Mariel Craft, MD      . diphenhydrAMINE (BENADRYL) capsule 25 mg  25 mg Oral Q8H PRN Mariel Craft, MD      . diphenhydrAMINE (BENADRYL) capsule 50 mg  50 mg Oral QHS Mariel Craft, MD   50 mg at 03/26/18 2110  . haloperidol (HALDOL) tablet 10 mg  10 mg Oral QHS Mariel Craft, MD   10 mg at 03/26/18 2111  . magnesium hydroxide (MILK OF MAGNESIA) suspension 30 mL  30 mL Oral Daily PRN Mariel Craft, MD      . senna-docusate (Senokot-S) tablet 1 tablet  1 tablet Oral QHS PRN Mariel Craft, MD       PTA Medications: Medications Prior to Admission  Medication Sig Dispense Refill Last Dose  . divalproex (DEPAKOTE ER) 500 MG 24 hr tablet Take 2 tablets (1,000 mg total) by mouth at bedtime.     . haloperidol (HALDOL) 10 MG tablet Take 1 tablet (10 mg total) by mouth at bedtime.       Patient Stressors:    Patient Strengths:    Treatment Modalities: Medication Management, Group therapy, Case management,  1 to 1 session with clinician, Psychoeducation, Recreational therapy.   Physician Treatment Plan for Primary Diagnosis: Disorganized schizophrenia (HCC) Long Term Goal(s): Improvement in symptoms so as ready  for discharge Improvement in symptoms so as ready for discharge   Short Term Goals: Ability to identify changes in lifestyle to reduce recurrence of condition will improve Ability to verbalize feelings will improve Ability to disclose and discuss suicidal ideas Ability to demonstrate self-control will improve Ability to identify and develop effective coping behaviors will improve Ability to maintain clinical measurements within normal limits will improve Compliance with prescribed medications will improve Ability to identify triggers associated with substance abuse/mental health issues will improve NA  Medication Management: Evaluate patient's response, side effects, and tolerance of medication regimen.  Therapeutic Interventions: 1 to 1 sessions, Unit Group sessions and Medication administration.  Evaluation of Outcomes: Progressing  Physician Treatment Plan for Secondary Diagnosis: Principal Problem:   Disorganized schizophrenia (HCC) Active Problems:   Hyponatremia   Polydipsia   Tobacco use disorder  Long Term Goal(s): Improvement in symptoms so as ready for discharge Improvement in symptoms so as ready for discharge   Short Term Goals: Ability to identify changes in lifestyle to reduce recurrence of condition will improve Ability to verbalize feelings will improve Ability to disclose and discuss suicidal ideas Ability to demonstrate self-control will improve Ability to identify and develop effective coping behaviors will improve Ability to maintain clinical measurements within normal limits will improve Compliance with prescribed medications will improve Ability to identify triggers associated with substance abuse/mental health issues will improve NA  Medication Management: Evaluate patient's response, side effects, and tolerance of medication regimen.  Therapeutic Interventions: 1 to 1 sessions, Unit Group sessions and Medication administration.  Evaluation of  Outcomes: Progressing   RN Treatment Plan for Primary Diagnosis: Disorganized schizophrenia (HCC) Long Term Goal(s): Knowledge of disease and therapeutic regimen to maintain health will improve  Short Term Goals: Ability to verbalize frustration and anger appropriately will improve, Ability to demonstrate self-control, Ability to participate in decision making will improve, Ability to verbalize feelings will improve and Ability to identify and develop effective coping behaviors will improve  Medication Management: RN will administer medications as ordered by provider, will assess and evaluate patient's response and provide education to patient for prescribed medication. RN will report any adverse and/or side effects to prescribing provider.  Therapeutic Interventions: 1 on 1 counseling sessions, Psychoeducation, Medication administration, Evaluate responses to treatment, Monitor vital signs and CBGs as ordered, Perform/monitor CIWA, COWS, AIMS and Fall Risk screenings as ordered, Perform wound care treatments as ordered.  Evaluation of Outcomes: Progressing   LCSW Treatment Plan for Primary Diagnosis: Disorganized schizophrenia (HCC) Long Term Goal(s): Safe transition to appropriate next level of care at discharge, Engage patient in therapeutic group addressing interpersonal concerns.  Short Term Goals: Engage patient in aftercare planning with referrals and resources, Increase social support, Increase ability to appropriately verbalize feelings, Increase emotional regulation and Facilitate acceptance of mental health diagnosis and concerns  Therapeutic Interventions: Assess for all discharge needs, 1 to 1 time with Social worker, Explore available resources and support systems, Assess for adequacy in community support network, Educate family and significant other(s) on suicide prevention, Complete Psychosocial Assessment, Interpersonal group therapy.  Evaluation of Outcomes:  Progressing   Progress in Treatment: Attending groups: No. Participating in groups: No. Taking medication as prescribed: Yes. Toleration medication: Yes. Family/Significant other contact made: No, will contact:  once permission is given.   Patient understands diagnosis: Yes. Discussing patient identified problems/goals with staff: Yes. Medical problems stabilized or resolved: Yes. Denies suicidal/homicidal ideation: Yes. Issues/concerns per patient self-inventory: No. Other: none  New problem(s) identified: No, Describe:  none  New Short Term/Long Term Goal(s): elimination of symptoms of psychosis, medication management for mood stabilization; elimination of SI thoughts; development of comprehensive mental wellness plan.  Patient Goals:  "to get a job as soon as I can and get off these medications"  Discharge Plan or Barriers: Pt denies any barriers.   Reason for Continuation of Hospitalization: Anxiety Depression Mania Medication stabilization  Estimated Length of Stay: 1-5 days  Recreational Therapy: Patient Stressors: N/A Patient Goal: Patient will engage in groups without prompting or encouragement from LRT x3 group sessions within 5 recreation therapy group sessions  Attendees: Patient: Shane Nash 03/27/2018 11:21 AM  Physician: Dr. Jennet Maduro, MD 03/27/2018 11:21 AM  Nursing:  03/27/2018 11:21 AM  RN Care Manager: 03/27/2018 11:21 AM  Social Worker:  Penni Homans, LCSW 03/27/2018 11:21 AM  Recreational Therapist: Garret Reddish, CTRS, LRT 03/27/2018 11:21 AM  Other: Iris Pert, LCSW 03/27/2018 11:21 AM  Other: Lowella Dandy, LCSW 03/27/2018 11:21 AM  Other: 03/27/2018 11:21 AM    Scribe for Treatment Team: [name], LCSW 03/27/2018 11:21 AM

## 2018-03-27 NOTE — Progress Notes (Signed)
Recreation Therapy Notes  Date: 03/27/2018  Time: 9:30 am   Location: Craft room   Behavioral response: N/A   Intervention Topic: Communication  Discussion/Intervention: Patient did not attend group.   Clinical Observations/Feedback:  Patient did not attend group.   California Huberty LRT/CTRS        Soua Caltagirone 03/27/2018 11:11 AM

## 2018-03-27 NOTE — [facility] Suicide Risk Assessment (Signed)
CSW spoke with the patient's sister, Kiani Standing, [phone].    Pt sister reports that he tells her that he is not going to take his medications.  Sister reports that she thinks that the patient needs to be "in some type of facility because he is not going to take his medications".  Sister reports "as long as he access to drugs and alcohol and things like that there is no way to keep him balanced".  Sister clarifies that the patient is declining to take the pill forms of his medications and only willing to take injections.  Sister reports "I don't know that we can sustain them."  Penni Homans, MSW, LCSW 03/27/2018 11:59 AM

## 2018-03-28 LAB — HIV ANTIBODY (ROUTINE TESTING W REFLEX): HIV Screen 4th Generation wRfx: NONREACTIVE

## 2018-03-28 NOTE — Plan of Care (Signed)
  Problem: Education: Goal: Utilization of techniques to improve thought processes will improve Outcome: Progressing  Patient thought process is improving

## 2018-03-28 NOTE — Progress Notes (Signed)
[facility] MD Progress Note  03/28/2018 5:04 PM Shane Nash  MRN:  [id number] Subjective: Follow-up for this patient with schizophrenia and hyponatremia.  Patient seen chart reviewed.  Patient has no new complaints.  Reports that his mood is feeling fine.  Denies feeling sick.  Patient is staying mostly to himself in bed but does not appear to be bizarre or preoccupied.  No new labs today.  Slept adequately last night. Principal Problem: Disorganized schizophrenia (HCC) Diagnosis: Principal Problem:   Disorganized schizophrenia (HCC) Active Problems:   Hyponatremia   Polydipsia   Tobacco use disorder  Total Time spent with patient: 30 minutes  Past Psychiatric History: Patient has a long history of schizophrenia and hyponatremia.  Past Medical History:  Past Medical History:  Diagnosis Date  . Schizophrenia (HCC)    History reviewed. No pertinent surgical history. Family History: History reviewed. No pertinent family history. Family Psychiatric  History: See previous Social History:  Social History   Substance and Sexual Activity  Alcohol Use Not on file     Social History   Substance and Sexual Activity  Drug Use No    Social History   Socioeconomic History  . Marital status: Single    Spouse name: Not on file  . Number of children: Not on file  . Years of education: Not on file  . Highest education level: Not on file  Occupational History  . Not on file  Social Needs  . Financial resource strain: Not on file  . Food insecurity:    Worry: Not on file    Inability: Not on file  . Transportation needs:    Medical: Not on file    Non-medical: Not on file  Tobacco Use  . Smoking status: Current Every Day Smoker    Packs/day: 0.25    Years: 15.00    Pack years: 3.75    Types: Cigarettes  . Smokeless tobacco: Never Used  . Tobacco comment: Cannot state the amount he smokes per day  Substance and Sexual Activity  . Alcohol use: Not on file  . Drug use: No  .  Sexual activity: Not Currently  Lifestyle  . Physical activity:    Days per week: Not on file    Minutes per session: Not on file  . Stress: Not on file  Relationships  . Social connections:    Talks on phone: Not on file    Gets together: Not on file    Attends religious service: Not on file    Active member of club or organization: Not on file    Attends meetings of clubs or organizations: Not on file    Relationship status: Not on file  Other Topics Concern  . Not on file  Social History Narrative  . Not on file   Additional Social History:                         Sleep: Fair  Appetite:  Fair  Current Medications: Current Facility-Administered Medications  Medication Dose Route Frequency Provider Last Rate Last Dose  . acetaminophen (TYLENOL) tablet 650 mg  650 mg Oral Q6H PRN Mariel Craft, MD      . albuterol (PROVENTIL) (2.5 MG/3ML) 0.083% nebulizer solution 2.5 mg  2.5 mg Nebulization Q2H PRN Mariel Craft, MD      . alum & mag hydroxide-simeth (MAALOX/MYLANTA) 200-200-20 MG/5ML suspension 30 mL  30 mL Oral Q4H PRN Mariel Craft, MD      .  diphenhydrAMINE (BENADRYL) capsule 25 mg  25 mg Oral Q8H PRN Mariel Craft, MD      . diphenhydrAMINE (BENADRYL) capsule 50 mg  50 mg Oral QHS Mariel Craft, MD   50 mg at 03/27/18 2209  . haloperidol (HALDOL) tablet 10 mg  10 mg Oral QHS Mariel Craft, MD   10 mg at 03/27/18 2209  . [START ON 03/30/2018] haloperidol decanoate (HALDOL DECANOATE) 100 MG/ML injection 150 mg  150 mg Intramuscular Q28 days Pucilowska, Jolanta B, MD      . magnesium hydroxide (MILK OF MAGNESIA) suspension 30 mL  30 mL Oral Daily PRN Mariel Craft, MD      . senna-docusate (Senokot-S) tablet 1 tablet  1 tablet Oral QHS PRN Mariel Craft, MD        Lab Results:  Results for orders placed or performed during the hospital encounter of 03/26/18 (from the past 48 hour(s))  HIV antibody (Routine Testing)     Status: None    Collection Time: 03/26/18  9:07 PM  Result Value Ref Range   HIV Screen 4th Generation wRfx Non Reactive Non Reactive    Comment: (NOTE) Performed At: [facility] [address] [place], [place] [id number] Jolene Schimke MD [id number]   Hepatic function panel     Status: Abnormal   Collection Time: 03/26/18  9:07 PM  Result Value Ref Range   Total Protein 8.4 (H) 6.5 - 8.1 g/dL   Albumin 4.3 3.5 - 5.0 g/dL   AST 314 (H) 15 - 41 U/L   ALT 33 0 - 44 U/L   Alkaline Phosphatase 74 38 - 126 U/L   Total Bilirubin 0.7 0.3 - 1.2 mg/dL   Bilirubin, Direct 0.1 0.0 - 0.2 mg/dL   Indirect Bilirubin 0.6 0.3 - 0.9 mg/dL    Comment: Performed at [facility], [address] [place], [place] [address]  Basic metabolic panel     Status: Abnormal   Collection Time: 03/27/18 10:34 AM  Result Value Ref Range   Sodium 138 135 - 145 mmol/L   Potassium 4.0 3.5 - 5.1 mmol/L   Chloride 103 98 - 111 mmol/L   CO2 25 22 - 32 mmol/L   Glucose, Bld 102 (H) 70 - 99 mg/dL   BUN 14 6 - 20 mg/dL   Creatinine, Ser 5.79 0.61 - 1.24 mg/dL   Calcium 9.4 8.9 - 72.8 mg/dL   GFR calc non Af Amer >60 >60 mL/min   GFR calc Af Amer >60 >60 mL/min   Anion gap 10 5 - 15    Comment: Performed at [facility], [address] [place], [place] [address]  Lipid panel     Status: Abnormal   Collection Time: 03/27/18 10:34 AM  Result Value Ref Range   Cholesterol 193 0 - 200 mg/dL   Triglycerides 561 (H) <150 mg/dL   HDL 51 >53 mg/dL   Total CHOL/HDL Ratio 3.8 RATIO   VLDL 31 0 - 40 mg/dL   LDL Cholesterol 794 (H) 0 - 99 mg/dL    Comment:        Total Cholesterol/HDL:CHD Risk Coronary Heart Disease Risk Table                     Men   Women  1/2 Average Risk   3.4   3.3  Average Risk       5.0   4.4  2 X Average Risk  9.6   7.1  3 X Average Risk  23.4   11.0        Use the calculated Patient Ratio above and the CHD Risk Table to determine the patient's CHD Risk.        ATP III  CLASSIFICATION (LDL):  <100     mg/dL   Optimal  960-454  mg/dL   Near or Above                    Optimal  130-159  mg/dL   Borderline  098-119  mg/dL   High  >147     mg/dL   Very High Performed at [facility], [address] [place], [place] [address]   TSH     Status: None   Collection Time: 03/27/18 10:34 AM  Result Value Ref Range   TSH 2.043 0.350 - 4.500 uIU/mL    Comment: Performed by a 3rd Generation assay with a functional sensitivity of <=0.01 uIU/mL. Performed at [facility], [address] [place], [place] [address]   Hemoglobin A1c     Status: None   Collection Time: 03/27/18 10:34 AM  Result Value Ref Range   Hgb A1c MFr Bld 5.2 4.8 - 5.6 %    Comment: (NOTE) Pre diabetes:          5.7%-6.4% Diabetes:              >6.4% Glycemic control for   <7.0% adults with diabetes    Mean Plasma Glucose 102.54 mg/dL    Comment: Performed at [facility], [address], [place], [place] [address]    Blood Alcohol level:  Lab Results  Component Value Date   ETH <10 03/25/2018   ETH <10 01/27/2017    Metabolic Disorder Labs: Lab Results  Component Value Date   HGBA1C 5.2 03/27/2018   MPG 102.54 03/27/2018   MPG 102.54 01/31/2017   No results found for: PROLACTIN Lab Results  Component Value Date   CHOL 193 03/27/2018   TRIG 154 (H) 03/27/2018   HDL 51 03/27/2018   CHOLHDL 3.8 03/27/2018   VLDL 31 03/27/2018   LDLCALC 111 (H) 03/27/2018   LDLCALC 102 (H) 01/31/2017    Physical Findings: AIMS:  , ,  ,  ,    CIWA:    COWS:     Musculoskeletal: Strength & Muscle Tone: within normal limits Gait & Station: normal Patient leans: N/A  Psychiatric Specialty Exam: Physical Exam  Nursing note and vitals reviewed. Constitutional: He appears well-developed and well-nourished.  HENT:  Head: Normocephalic and atraumatic.  Eyes: Pupils are equal, round, and reactive to light. Conjunctivae are normal.  Neck: Normal range of  motion.  Cardiovascular: Regular rhythm and normal heart sounds.  Respiratory: Effort normal. No respiratory distress.  GI: Soft.  Musculoskeletal: Normal range of motion.  Neurological: He is alert.  Skin: Skin is warm and dry.  Psychiatric: His affect is blunt. His speech is delayed. He is slowed. Cognition and memory are impaired. He expresses impulsivity. He expresses no homicidal and no suicidal ideation.    Review of Systems  Constitutional: Negative.   HENT: Negative.   Eyes: Negative.   Respiratory: Negative.   Cardiovascular: Negative.   Gastrointestinal: Negative.   Musculoskeletal: Negative.   Skin: Negative.   Neurological: Negative.   Psychiatric/Behavioral: Negative.     Blood pressure (!) 140/98, pulse 95, temperature 98.7 F (37.1 C), temperature source Oral, resp. rate 18,  SpO2 100 %.There is no height or weight on file to calculate BMI.  General Appearance: Casual  Eye Contact:  Fair  Speech:  Clear and Coherent  Volume:  Decreased  Mood:  Dysphoric  Affect:  Constricted  Thought Process:  Goal Directed  Orientation:  Full (Time, Place, and Person)  Thought Content:  Logical  Suicidal Thoughts:  No  Homicidal Thoughts:  No  Memory:  Immediate;   Fair Recent;   Poor Remote;   Fair  Judgement:  Impaired  Insight:  Shallow  Psychomotor Activity:  Decreased  Concentration:  Concentration: Fair  Recall:  [employer] of Knowledge:  Fair  Language:  Fair  Akathisia:  No  Handed:  Right  AIMS (if indicated):     Assets:  Desire for Improvement Housing Resilience  ADL's:  Impaired  Cognition:  Impaired,  Mild  Sleep:  Number of Hours: 2.75     Treatment Plan Summary: Daily contact with patient to assess and evaluate symptoms and progress in treatment, Medication management and Plan Patient primarily being treated with Haldol and Depakote is being held pending a decision after seeing how stable his sodium will be.  Continue some routine checking of  labs.  Encourage patient to get out of his room and interact with others no change to medicine for today.  Mordecai Rasmussen, MD 03/28/2018, 5:04 PM

## 2018-03-28 NOTE — Progress Notes (Signed)
Patient is alert and oriented x 3 with confusion noted to situation, affect is flat, thoughts are disorganized and incoherent, some delayed response noted during assessment, patient noted responding to internal stimuli, he looks disheveled, he is restless and at times he has to bee redirected and reoriented. 15 minutes safety checks maintained will continue to monitor.

## 2018-03-28 NOTE — [facility] Group Notes (Signed)
[facility] Group Notes:  (Nursing/MHT/Case Management/Adjunct)  Date:  03/28/2018  Time:  9:45 PM  Type of Therapy:  Group Therapy  Participation Level:  Did Not Attend  Summary of Progress/Problems:  Mayra Neer 03/28/2018, 9:45 PM

## 2018-03-28 NOTE — [facility] Group Notes (Signed)
LCSW Group Therapy Note   03/28/2018 1:15pm   Type of Therapy and Topic:  Group Therapy:  Trust and Honesty  Participation Level:  Active  Description of Group:    In this group patients will be asked to explore the value of being honest.  Patients will be guided to discuss their thoughts, feelings, and behaviors related to honesty and trusting in others. Patients will process together how trust and honesty relate to forming relationships with peers, family members, and self. Each patient will be challenged to identify and express feelings of being vulnerable. Patients will discuss reasons why people are dishonest and identify alternative outcomes if one was truthful (to self or others). This group will be process-oriented, with patients participating in exploration of their own experiences, giving and receiving support, and processing challenge from other group members.   Therapeutic Goals: 1. Patient will identify why honesty is important to relationships and how honesty overall affects relationships.  2. Patient will identify a situation where they lied or were lied too and the  feelings, thought process, and behaviors surrounding the situation 3. Patient will identify the meaning of being vulnerable, how that feels, and how that correlates to being honest with self and others. 4. Patient will identify situations where they could have told the truth, but instead lied and explain reasons of dishonesty.   Summary of Patient Progress: The patient was able to explore the value of being honest.  Patient discussed thoughts, feelings, and behaviors related to honesty and trusting in others. The patient processed together with other group members how trust and honesty relate to forming relationships with peers, family members, and self. Pt actively and appropriately engaged in the group. Patient was able to provide support and validation to other group members. Patient practiced active listening when  interacting with the facilitator and other group members.    Therapeutic Modalities:   Cognitive Behavioral Therapy Solution Focused Therapy Motivational Interviewing Brief Therapy  Natally Ribera  CUEBAS-COLON, LCSW 03/28/2018 12:37 PM

## 2018-03-29 MED ORDER — HALOPERIDOL 5 MG PO TABS
5.0000 mg | ORAL_TABLET | Freq: Every day | ORAL | Status: DC
  Administered 2018-03-29 – 2018-04-03 (×6): 5 mg via ORAL
  Filled 2018-03-29 (×6): qty 1

## 2018-03-29 NOTE — [facility] Group Notes (Signed)
LCSW Group Therapy Note 03/29/2018 1:15pm  Type of Therapy and Topic: Group Therapy: Feelings Around Returning Home & Establishing a Supportive Framework and Supporting Oneself When Supports Not Available  Participation Level: Did Not Attend  Description of Group:  Patients first processed thoughts and feelings about upcoming discharge. These included fears of upcoming changes, lack of change, new living environments, judgements and expectations from others and overall stigma of mental health issues. The group then discussed the definition of a supportive framework, what that looks and feels like, and how do to discern it from an unhealthy non-supportive network. The group identified different types of supports as well as what to do when your family/friends are less than helpful or unavailable  Therapeutic Goals  1. Patient will identify one healthy supportive network that they can use at discharge. 2. Patient will identify one factor of a supportive framework and how to tell it from an unhealthy network. 3. Patient able to identify one coping skill to use when they do not have positive supports from others. 4. Patient will demonstrate ability to communicate their needs through discussion and/or role plays.  Summary of Patient Progress:  Pt was invited to attend group but chose not to attend. CSW will continue to encourage pt to attend group throughout their admission.   Therapeutic Modalities Cognitive Behavioral Therapy Motivational Interviewing   Kolsen Choe  CUEBAS-COLON, LCSW 03/29/2018 10:09 AM  

## 2018-03-29 NOTE — Progress Notes (Signed)
[facility] MD Progress Note  03/29/2018 1:33 PM Shane Nash  MRN:  [id number] Subjective: Follow-up for this patient with schizophrenia.  Patient seen chart reviewed.  For much of the morning the patient was walking around the ward holding his hand out in front of him as though he were holding a cell phone.  Frequently checking it and talking into his hand.  No aggressive or violent behavior.  On interview he denies that he is having any hallucinations.  Remains disorganized and confused.  Denies suicidal thoughts.  He is not having akathisia and does not appear to be having any side effects or problems from his medicine.  No new medical complaints.  Blood pressure slightly elevated but not badly so. Principal Problem: Disorganized schizophrenia (HCC) Diagnosis: Principal Problem:   Disorganized schizophrenia (HCC) Active Problems:   Hyponatremia   Polydipsia   Tobacco use disorder  Total Time spent with patient: 30 minutes  Past Psychiatric History: Patient has a history of schizophrenia and hyponatremia.  Has been hospitalized for water drinking behavior in the past.  History of disorganized behavior.  Not acutely suicidal.  Past Medical History:  Past Medical History:  Diagnosis Date  . Schizophrenia (HCC)    History reviewed. No pertinent surgical history. Family History: History reviewed. No pertinent family history. Family Psychiatric  History: None reported Social History:  Social History   Substance and Sexual Activity  Alcohol Use Not on file     Social History   Substance and Sexual Activity  Drug Use No    Social History   Socioeconomic History  . Marital status: Single    Spouse name: Not on file  . Number of children: Not on file  . Years of education: Not on file  . Highest education level: Not on file  Occupational History  . Not on file  Social Needs  . Financial resource strain: Not on file  . Food insecurity:    Worry: Not on file    Inability: Not on  file  . Transportation needs:    Medical: Not on file    Non-medical: Not on file  Tobacco Use  . Smoking status: Current Every Day Smoker    Packs/day: 0.25    Years: 15.00    Pack years: 3.75    Types: Cigarettes  . Smokeless tobacco: Never Used  . Tobacco comment: Cannot state the amount he smokes per day  Substance and Sexual Activity  . Alcohol use: Not on file  . Drug use: No  . Sexual activity: Not Currently  Lifestyle  . Physical activity:    Days per week: Not on file    Minutes per session: Not on file  . Stress: Not on file  Relationships  . Social connections:    Talks on phone: Not on file    Gets together: Not on file    Attends religious service: Not on file    Active member of club or organization: Not on file    Attends meetings of clubs or organizations: Not on file    Relationship status: Not on file  Other Topics Concern  . Not on file  Social History Narrative  . Not on file   Additional Social History:                         Sleep: Fair  Appetite:  Fair  Current Medications: Current Facility-Administered Medications  Medication Dose Route Frequency Provider Last Rate Last  Dose  . acetaminophen (TYLENOL) tablet 650 mg  650 mg Oral Q6H PRN Mariel Craft, MD      . albuterol (PROVENTIL) (2.5 MG/3ML) 0.083% nebulizer solution 2.5 mg  2.5 mg Nebulization Q2H PRN Mariel Craft, MD      . alum & mag hydroxide-simeth (MAALOX/MYLANTA) 200-200-20 MG/5ML suspension 30 mL  30 mL Oral Q4H PRN Mariel Craft, MD      . diphenhydrAMINE (BENADRYL) capsule 25 mg  25 mg Oral Q8H PRN Mariel Craft, MD      . diphenhydrAMINE (BENADRYL) capsule 50 mg  50 mg Oral QHS Mariel Craft, MD   50 mg at 03/28/18 2119  . haloperidol (HALDOL) tablet 10 mg  10 mg Oral QHS Mariel Craft, MD   10 mg at 03/28/18 2119  . haloperidol (HALDOL) tablet 5 mg  5 mg Oral Daily [name], [name], MD      . Melene Muller ON 03/30/2018] haloperidol decanoate (HALDOL  DECANOATE) 100 MG/ML injection 150 mg  150 mg Intramuscular Q28 days Pucilowska, Jolanta B, MD      . magnesium hydroxide (MILK OF MAGNESIA) suspension 30 mL  30 mL Oral Daily PRN Mariel Craft, MD      . senna-docusate (Senokot-S) tablet 1 tablet  1 tablet Oral QHS PRN Mariel Craft, MD        Lab Results: No results found for this or any previous visit (from the past 48 hour(s)).  Blood Alcohol level:  Lab Results  Component Value Date   ETH <10 03/25/2018   ETH <10 01/27/2017    Metabolic Disorder Labs: Lab Results  Component Value Date   HGBA1C 5.2 03/27/2018   MPG 102.54 03/27/2018   MPG 102.54 01/31/2017   No results found for: PROLACTIN Lab Results  Component Value Date   CHOL 193 03/27/2018   TRIG 154 (H) 03/27/2018   HDL 51 03/27/2018   CHOLHDL 3.8 03/27/2018   VLDL 31 03/27/2018   LDLCALC 111 (H) 03/27/2018   LDLCALC 102 (H) 01/31/2017    Physical Findings: AIMS:  , ,  ,  ,    CIWA:    COWS:     Musculoskeletal: Strength & Muscle Tone: within normal limits Gait & Station: normal Patient leans: N/A  Psychiatric Specialty Exam: Physical Exam  Nursing note and vitals reviewed. Constitutional: He appears well-developed and well-nourished.  HENT:  Head: Normocephalic and atraumatic.  Eyes: Pupils are equal, round, and reactive to light. Conjunctivae are normal.  Neck: Normal range of motion.  Cardiovascular: Regular rhythm and normal heart sounds.  Respiratory: Effort normal. No respiratory distress.  GI: Soft.  Musculoskeletal: Normal range of motion.  Neurological: He is alert.  Skin: Skin is warm and dry.  Psychiatric: His affect is blunt. His speech is delayed. He is slowed. Cognition and memory are impaired. He expresses inappropriate judgment. He expresses no homicidal and no suicidal ideation. He exhibits abnormal recent memory.    Review of Systems  Constitutional: Negative.   HENT: Negative.   Eyes: Negative.   Respiratory: Negative.    Cardiovascular: Negative.   Gastrointestinal: Negative.   Musculoskeletal: Negative.   Skin: Negative.   Neurological: Negative.   Psychiatric/Behavioral: Negative.     Blood pressure (!) 127/97, pulse 81, temperature 98.6 F (37 C), temperature source Oral, resp. rate 18, SpO2 99 %.There is no height or weight on file to calculate BMI.  General Appearance: Disheveled  Eye Contact:  Minimal  Speech:  Garbled  and Slow  Volume:  Decreased  Mood:  Euthymic  Affect:  Constricted  Thought Process:  Disorganized  Orientation:  Full (Time, Place, and Person)  Thought Content:  Illogical  Suicidal Thoughts:  No  Homicidal Thoughts:  No  Memory:  Immediate;   Fair Recent;   Poor Remote;   Poor  Judgement:  Impaired  Insight:  Shallow  Psychomotor Activity:  Normal  Concentration:  Concentration: Poor  Recall:  Fair  Fund of Knowledge:  Fair  Language:  Fair  Akathisia:  No  Handed:  Right  AIMS (if indicated):     Assets:  Desire for Improvement Physical Health Resilience  ADL's:  Impaired  Cognition:  Impaired,  Mild  Sleep:  Number of Hours: 4.3     Treatment Plan Summary: Daily contact with patient to assess and evaluate symptoms and progress in treatment, Medication management and Plan Patient was observed by myself and nursing to continue to have psychotic symptoms during the day.  I am adding 5 mg of Haldol to his other orders for the morning dose.  Patient does not appear to be having akathisia or other side effects from his medicine.  Nursing and I have not noticed any problem water drinking behavior.  We will check a chemistry panel tomorrow.  No other change to plan for now.  Shane Rasmussen, MD 03/29/2018, 1:33 PM

## 2018-03-29 NOTE — Progress Notes (Signed)
Patient did not attend groups this evening but came in for snacks and stayed in the day room for a little longer  with peers communicating events , no out burst or violent behaviors noted, patient is responding appropriately upon approach, contract for safety of self and others,   Appetite is good and well hydrated with fluids and juices , mood is pleasant and affect is passive, patient is made aware of positive coping skills and to voice and identify positive attributes about self, encouraged to attend groups and participate in leisure activities .  Patient verbalize  understanding  information  Provided and made aware of 15 minutes safety checks is maintained denies any SI/HI/AVH no distress,

## 2018-03-29 NOTE — Progress Notes (Signed)
D: Patient  Noted to respond  To internal stimuli  Noted to hold something  In front of himself  As though it was a phone . Marland Kitchen Noted to  Take a couple of steps forward he  Then  backs up . Voice of wanting to go home  . Patient disheveled  With appearance . No ADL  D: Patient stated slept good last night .Stated appetite is good and energy level  Is normal. Stated concentration not good .noted to continuously talk to himself  A: Encourage patient participation with unit programming . Instruction  Given on  Medication , verbalize understanding.  R: Voice no other concerns. Staff continue to monitor

## 2018-03-29 NOTE — [facility] Group Notes (Signed)
[facility] Group Notes:  (Nursing/MHT/Case Management/Adjunct)  Date:  03/29/2018  Time:  3:47 PM  Type of Therapy:  Relaxation Group  Participation Level:  Did Not Attend   Lynelle Smoke [facility] 03/29/2018, 3:47 PM

## 2018-03-29 NOTE — Plan of Care (Signed)
Thought  process remain altered .  No anxiety noted . Limited participation  with unit  programing .  Unable to attend  unit programing  No verbalizing feelings. No work towards  coping or decision making    Problem: Activity: Goal: Interest or engagement in leisure activities will improve Outcome: Not Progressing   Problem: Coping: Goal: Coping ability will improve Outcome: Not Progressing Goal: Will verbalize feelings Outcome: Not Progressing   Problem: Health Behavior/Discharge Planning: Goal: Ability to make decisions will improve Outcome: Not Progressing   Problem: Role Relationship: Goal: Will demonstrate positive changes in social behaviors and relationships Outcome: Not Progressing   Problem: Education: Goal: Utilization of techniques to improve thought processes will improve Outcome: Progressing Goal: Knowledge of the prescribed therapeutic regimen will improve Outcome: Progressing   Problem: Activity: Goal: Imbalance in normal sleep/wake cycle will improve Outcome: Progressing   Problem: Health Behavior/Discharge Planning: Goal: Compliance with therapeutic regimen will improve Outcome: Progressing   Problem: Safety: Goal: Ability to disclose and discuss suicidal ideas will improve Outcome: Progressing Goal: Ability to identify and utilize support systems that promote safety will improve Outcome: Progressing   Problem: Self-Concept: Goal: Will verbalize positive feelings about self Outcome: Progressing Goal: Level of anxiety will decrease Outcome: Progressing

## 2018-03-30 LAB — BASIC METABOLIC PANEL
Anion gap: 9 (ref 5–15)
BUN: 18 mg/dL (ref 6–20)
CO2: 26 mmol/L (ref 22–32)
Calcium: 8.9 mg/dL (ref 8.9–10.3)
Chloride: 103 mmol/L (ref 98–111)
Creatinine, Ser: 0.93 mg/dL (ref 0.61–1.24)
GFR calc Af Amer: >60 mL/min (ref >60)
GFR calc non Af Amer: >60 mL/min (ref >60)
Glucose, Bld: 100 mg/dL — ABNORMAL HIGH (ref 70–99)
Potassium: 4.1 mmol/L (ref 3.5–5.1)
Sodium: 138 mmol/L (ref 135–145)

## 2018-03-30 NOTE — [facility] Group Notes (Signed)
LCSW Group Therapy Note   03/30/2018 1:30 PM   Type of Therapy and Topic:  Group Therapy:  Overcoming Obstacles   Participation Level:  Active   Description of Group:    In this group patients will be encouraged to explore what they see as obstacles to their own wellness and recovery. They will be guided to discuss their thoughts, feelings, and behaviors related to these obstacles. The group will process together ways to cope with barriers, with attention given to specific choices patients can make. Each patient will be challenged to identify changes they are motivated to make in order to overcome their obstacles. This group will be process-oriented, with patients participating in exploration of their own experiences as well as giving and receiving support and challenge from other group members.   Therapeutic Goals: 1. Patient will identify personal and current obstacles as they relate to admission. 2. Patient will identify barriers that currently interfere with their wellness or overcoming obstacles.  3. Patient will identify feelings, thought process and behaviors related to these barriers. 4. Patient will identify two changes they are willing to make to overcome these obstacles:      Summary of Patient Progress Pt was a little late to group, but when he arrived he participated and was active in the discussion. Pt reported that his current obstacle is not having a job and to overcome it he can work on finding him a job. Pt stated that a barrier to getting a job would be the difficulty of the job because if it is too hard he will not want to do it.      Therapeutic Modalities:   Cognitive Behavioral Therapy Solution Focused Therapy Motivational Interviewing Relapse Prevention Therapy  Iris Pert, MSW, LCSW Clinical Social Work 03/30/2018 1:30 PM

## 2018-03-30 NOTE — Progress Notes (Signed)
D: Patient stated slept good last night .Stated appetite good and energy level  normal. .Voice of no Depression scale , hopeless or  anxiety  Denies suicidal  homicidal ideations  . Denies  auditory hallucinations   Writer  observed  Patient  Talking to his hand  As though it was a cell phone . Patient would walk forward and  Then walk backwards Voice of No pain concerns . Limited  Interacting with peers and staff.Thought  process remain altered .  No anxiety noted . Limited participation  with unit  programing .  Unable to attend  unit programing  No verbalizing feelings. No work towards  coping or decision making  Noted to smile through out shift  No ADL'S this shift A: Encourage patient participation with unit programming . Instruction  Given on  Medication , verbalize understanding.  R: Voice no other concerns. Staff continue to monitor

## 2018-03-30 NOTE — Plan of Care (Signed)
Thought  process remain altered .  No anxiety noted . Limited participation  with unit  programing .  Unable to attend  unit programing  No verbalizing feelings. No work towards  coping or decision making    Problem: Education: Goal: Utilization of techniques to improve thought processes will improve Outcome: Progressing Goal: Knowledge of the prescribed therapeutic regimen will improve Outcome: Progressing   Problem: Activity: Goal: Interest or engagement in leisure activities will improve Outcome: Progressing Goal: Imbalance in normal sleep/wake cycle will improve Outcome: Progressing   Problem: Coping: Goal: Coping ability will improve Outcome: Progressing Goal: Will verbalize feelings Outcome: Progressing   Problem: Health Behavior/Discharge Planning: Goal: Ability to make decisions will improve Outcome: Progressing   Problem: Role Relationship: Goal: Will demonstrate positive changes in social behaviors and relationships Outcome: Progressing   Problem: Safety: Goal: Ability to disclose and discuss suicidal ideas will improve Outcome: Progressing   Problem: Self-Concept: Goal: Will verbalize positive feelings about self Outcome: Progressing Goal: Level of anxiety will decrease Outcome: Progressing

## 2018-03-30 NOTE — Progress Notes (Signed)
[facility] MD Progress Note  03/30/2018 7:11 AM VINEET KINNEY  MRN:  [id number]  Subjective:    Mr. Bradway remains very disorganized in his htinking and dtill psychotic talking to imaginary people on imaginary phone. When asked directly, denies voices and tells me that h talks to his family on aa regular phone. Sodium 138.  Principal Problem: Disorganized schizophrenia (HCC) Diagnosis: Principal Problem:   Disorganized schizophrenia (HCC) Active Problems:   Hyponatremia   Polydipsia   Tobacco use disorder  Total Time spent with patient: 20 minutes  Past Psychiatric History: schizophrenia  Past Medical History:  Past Medical History:  Diagnosis Date  . Schizophrenia (HCC)    History reviewed. No pertinent surgical history. Family History: History reviewed. No pertinent family history. Family Psychiatric  History: none Social History:  Social History   Substance and Sexual Activity  Alcohol Use Not on file     Social History   Substance and Sexual Activity  Drug Use No    Social History   Socioeconomic History  . Marital status: Single    Spouse name: Not on file  . Number of children: Not on file  . Years of education: Not on file  . Highest education level: Not on file  Occupational History  . Not on file  Social Needs  . Financial resource strain: Not on file  . Food insecurity:    Worry: Not on file    Inability: Not on file  . Transportation needs:    Medical: Not on file    Non-medical: Not on file  Tobacco Use  . Smoking status: Current Every Day Smoker    Packs/day: 0.25    Years: 15.00    Pack years: 3.75    Types: Cigarettes  . Smokeless tobacco: Never Used  . Tobacco comment: Cannot state the amount he smokes per day  Substance and Sexual Activity  . Alcohol use: Not on file  . Drug use: No  . Sexual activity: Not Currently  Lifestyle  . Physical activity:    Days per week: Not on file    Minutes per session: Not on file  . Stress: Not on  file  Relationships  . Social connections:    Talks on phone: Not on file    Gets together: Not on file    Attends religious service: Not on file    Active member of club or organization: Not on file    Attends meetings of clubs or organizations: Not on file    Relationship status: Not on file  Other Topics Concern  . Not on file  Social History Narrative  . Not on file   Additional Social History:                         Sleep: Fair  Appetite:  Fair  Current Medications: Current Facility-Administered Medications  Medication Dose Route Frequency Provider Last Rate Last Dose  . acetaminophen (TYLENOL) tablet 650 mg  650 mg Oral Q6H PRN Mariel Craft, MD      . albuterol (PROVENTIL) (2.5 MG/3ML) 0.083% nebulizer solution 2.5 mg  2.5 mg Nebulization Q2H PRN Mariel Craft, MD      . alum & mag hydroxide-simeth (MAALOX/MYLANTA) 200-200-20 MG/5ML suspension 30 mL  30 mL Oral Q4H PRN Mariel Craft, MD      . diphenhydrAMINE (BENADRYL) capsule 25 mg  25 mg Oral Q8H PRN Mariel Craft, MD      .  diphenhydrAMINE (BENADRYL) capsule 50 mg  50 mg Oral QHS Mariel Craft, MD   50 mg at 03/29/18 2103  . haloperidol (HALDOL) tablet 10 mg  10 mg Oral QHS Mariel Craft, MD   10 mg at 03/29/18 2103  . haloperidol (HALDOL) tablet 5 mg  5 mg Oral Daily [name], [name], MD   5 mg at 03/29/18 1421  . haloperidol decanoate (HALDOL DECANOATE) 100 MG/ML injection 150 mg  150 mg Intramuscular Q28 days Kieron Kantner B, MD      . magnesium hydroxide (MILK OF MAGNESIA) suspension 30 mL  30 mL Oral Daily PRN Mariel Craft, MD      . senna-docusate (Senokot-S) tablet 1 tablet  1 tablet Oral QHS PRN Mariel Craft, MD        Lab Results: No results found for this or any previous visit (from the past 48 hour(s)).  Blood Alcohol level:  Lab Results  Component Value Date   ETH <10 03/25/2018   ETH <10 01/27/2017    Metabolic Disorder Labs: Lab Results  Component Value  Date   HGBA1C 5.2 03/27/2018   MPG 102.54 03/27/2018   MPG 102.54 01/31/2017   No results found for: PROLACTIN Lab Results  Component Value Date   CHOL 193 03/27/2018   TRIG 154 (H) 03/27/2018   HDL 51 03/27/2018   CHOLHDL 3.8 03/27/2018   VLDL 31 03/27/2018   LDLCALC 111 (H) 03/27/2018   LDLCALC 102 (H) 01/31/2017    Physical Findings: AIMS:  , ,  ,  ,    CIWA:    COWS:     Musculoskeletal: Strength & Muscle Tone: within normal limits Gait & Station: normal Patient leans: N/A  Psychiatric Specialty Exam: Physical Exam  Nursing note and vitals reviewed. Psychiatric: His affect is blunt. His speech is delayed. He is actively hallucinating. Thought content is paranoid and delusional. Cognition and memory are impaired. He expresses impulsivity.    Review of Systems  Neurological: Negative.   Psychiatric/Behavioral: Negative.   All other systems reviewed and are negative.   Blood pressure 107/77, pulse 78, temperature 98.2 F (36.8 C), temperature source Oral, resp. rate 18, SpO2 98 %.There is no height or weight on file to calculate BMI.  General Appearance: Casual  Eye Contact:  Poor  Speech:  Slow  Volume:  Decreased  Mood:  Anxious  Affect:  Blunt  Thought Process:  Disorganized  Orientation:  Full (Time, Place, and Person)  Thought Content:  Hallucinations: Auditory  Suicidal Thoughts:  No  Homicidal Thoughts:  No  Memory:  Immediate;   Poor Recent;   Poor Remote;   Poor  Judgement:  Poor  Insight:  Lacking  Psychomotor Activity:  Normal  Concentration:  Concentration: Poor and Attention Span: Poor  Recall:  Poor  Fund of Knowledge:  Poor  Language:  Poor  Akathisia:  No  Handed:  Right  AIMS (if indicated):     Assets:  Housing Physical Health Resilience Social Support  ADL's:  Intact  Cognition:  Impaired,  Mild  Sleep:  Number of Hours: 4.3     Treatment Plan Summary: Daily contact with patient to assess and evaluate symptoms and progress  in treatment and Medication management   Mr. Schwarzkopf is a 38 year old male with a history of schizophrenia admitted for altered mental status with hyponatremia and polydipsia.   #Psychosis -continue Haldol 10 mg nightly -Haldol decanoate 150 mg every 28 days, last injection on 03/01/2018, due  on 03/30/2018  -polydipsia resolved  #Mood -Depakote 1000 mg daily, VPA level on admission low, last night his AST doubled -we will hold Depakote as antiepileptics often contribute to hyponatremia, will monitor  #Hyponatremia -resolved  #Elevated blood pressure -will monitor  #Smoking cessation -nicotine patch is available  #Lab -lipid panel, TSH, A1C -EKG  #Disposition -will return to his group home -follow up with [facility] ACT team  Kristine Linea, MD 03/30/2018, 7:11 AM

## 2018-03-30 NOTE — Progress Notes (Signed)
Recreation Therapy Notes   Date: 03/30/2018  Time: 9:30 am  Location: Craft Room  Behavioral response: Appropriate  Intervention Topic: Self-care  Discussion/Intervention:  Group content today was focused on Self-Care. The group defined self-care and some positive ways they care for themselves. Individuals expressed ways and reasons why they neglected any self-care in the past. Patients described ways to improve self-care in the future. The group explained what could happen if they did not do any self-care activities at all. The group participated in the intervention "self-care assessment" where they had a chance to discover some of their weaknesses and strengths in self- care. Patient came up with a self-care plan to improve themselves in the future.  Clinical Observations/Feedback:  Patient came to group late and stated that he exercises as a way to particpate in self-care. Individual participated in the intervention.   Lind Ausley LRT/CTRS          Yoanna Jurczyk 03/30/2018 11:58 AM

## 2018-03-30 NOTE — Progress Notes (Signed)
Recreation Therapy Notes  INPATIENT RECREATION THERAPY ASSESSMENT  Patient Details Name: ROMELIO KLEIN MRN: [id number] DOB: 03-24-1980 Today's Date: 03/30/2018       Information Obtained From: Patient  Able to Participate in Assessment/Interview: Yes  Patient Presentation: Responsive  Reason for Admission (Per Patient): Active Symptoms  Patient Stressors:    Coping Skills:   Music, Exercise  Leisure Interests (2+):  Individual - TV, Sports - Basketball  Frequency of Recreation/Participation: Monthly  Awareness of Community Resources:     [employer]:     Current Use:    If no, Barriers?:    Expressed Interest in [employer] Information:    [employer] of Residence:  Film/video editor  Patient Main Form of Transportation: Other (Comment)(My ACT team)  Patient Strengths:  N/A  Patient Identified Areas of Improvement:  I  do not know  Patient Goal for Hospitalization:  To get a job and keep it  Current SI (including self-harm):  No  Current HI:  No  Current AVH: No  Staff Intervention Plan: Group Attendance, Collaborate with Interdisciplinary Treatment Team  Consent to Intern Participation: N/A  Gearldene Fiorenza 03/30/2018, 12:01 PM

## 2018-03-30 NOTE — Plan of Care (Signed)
Pt. Denies si/hi/avh, can contract for safety. Pt. Monitored by staff for safety.    Problem: Safety: Goal: Ability to disclose and discuss suicidal ideas will improve Outcome: Progressing

## 2018-03-30 NOTE — Progress Notes (Signed)
D: Pt during assessments denies SI/HI/AVH, can contract for safety. Pt is pleasant and cooperative, engages with this Clinical research associate when asked questions, but thought process feels delayed. Pt. Observed frequently responding to internal stimuli and pretending to talk on a cell phone that is not in his hand. Pt. Continues to present suspicious of his surroundings and paranoid. Pt. Thought process is disorganized as well. Pt. Endorses a normal mood despite presentation. Pt. Continues to present disheveled and bizarre in his appearance.  A: Q x 15 minute observation checks were completed for safety. Patient was provided with education. Patient was given/offered medications per orders. Patient  was encourage to attend groups, participate in unit activities and continue with plan of care. Pt. Chart and plans of care reviewed. Pt. Given support and encouragement.   R: Patient is complaint with medications and unit procedures. Pt. Attends snack time, eating good. Pt. Went to group. Pt. Intake and output monitored for safety by this Clinical research associate.              Precautionary checks every 15 minutes for safety maintained, room free of safety hazards, patient sustains no injury or falls during this shift. Will endorse care to next shift.

## 2018-03-31 NOTE — Progress Notes (Signed)
Recreation Therapy Notes  Date: 03/31/2018  Time: 9:30 am   Location: Craft room   Behavioral response: N/A   Intervention Topic: Team work  Discussion/Intervention: Patient did not attend group.   Clinical Observations/Feedback:  Patient did not attend group.   Riggin Cuttino LRT/CTRS        Addilyn Satterwhite 03/31/2018 10:46 AM 

## 2018-03-31 NOTE — Progress Notes (Signed)
Patient is showing minimal treatment response, patient is responding to hallucinations, talks to himself when no body is there , but able to follow command,any SI/HI/, at this time   Patient is medication compliant with out  Any side effects , support and encouragement is provided to improve mental and emotional state. Appetite is good, mood is no out burst behaviors.   Patient sleep through the night  and only requires routine visual check every 15 minutes and frequent  No distress.

## 2018-03-31 NOTE — [facility] Group Notes (Signed)
Feelings Around Diagnosis 03/31/2018 1PM  Type of Therapy/Topic:  Group Therapy:  Feelings about Diagnosis  Participation Level:  Did Not Attend   Description of Group:   This group will allow patients to explore their thoughts and feelings about diagnoses they have received. Patients will be guided to explore their level of understanding and acceptance of these diagnoses. Facilitator will encourage patients to process their thoughts and feelings about the reactions of others to their diagnosis and will guide patients in identifying ways to discuss their diagnosis with significant others in their lives. This group will be process-oriented, with patients participating in exploration of their own experiences, giving and receiving support, and processing challenge from other group members.   Therapeutic Goals: 1. Patient will demonstrate understanding of diagnosis as evidenced by identifying two or more symptoms of the disorder 2. Patient will be able to express two feelings regarding the diagnosis 3. Patient will demonstrate their ability to communicate their needs through discussion and/or role play  Summary of Patient Progress:       Therapeutic Modalities:   Cognitive Behavioral Therapy Brief Therapy Feelings Identification    Daley Mooradian T Lavonna Lampron, LCSW 03/31/2018 2:27 PM  

## 2018-03-31 NOTE — Progress Notes (Signed)
[facility] MD Progress Note  03/31/2018 11:44 AM ADDEN HOLLENBACH  MRN:  [id number]  Subjective:    Mr. Therriault denies any symptos od depression or psyhosis but is clearly attending to internal stimuli.  Principal Problem: Disorganized schizophrenia (HCC) Diagnosis: Principal Problem:   Disorganized schizophrenia (HCC) Active Problems:   Hyponatremia   Polydipsia   Tobacco use disorder  Total Time spent with patient: 15 minutes  Past Psychiatric History: schizophrenia  Past Medical History:  Past Medical History:  Diagnosis Date  . Schizophrenia (HCC)    History reviewed. No pertinent surgical history. Family History: History reviewed. No pertinent family history. Family Psychiatric  History: none Social History:  Social History   Substance and Sexual Activity  Alcohol Use Not on file     Social History   Substance and Sexual Activity  Drug Use No    Social History   Socioeconomic History  . Marital status: Single    Spouse name: Not on file  . Number of children: Not on file  . Years of education: Not on file  . Highest education level: Not on file  Occupational History  . Not on file  Social Needs  . Financial resource strain: Not on file  . Food insecurity:    Worry: Not on file    Inability: Not on file  . Transportation needs:    Medical: Not on file    Non-medical: Not on file  Tobacco Use  . Smoking status: Current Every Day Smoker    Packs/day: 0.25    Years: 15.00    Pack years: 3.75    Types: Cigarettes  . Smokeless tobacco: Never Used  . Tobacco comment: Cannot state the amount he smokes per day  Substance and Sexual Activity  . Alcohol use: Not on file  . Drug use: No  . Sexual activity: Not Currently  Lifestyle  . Physical activity:    Days per week: Not on file    Minutes per session: Not on file  . Stress: Not on file  Relationships  . Social connections:    Talks on phone: Not on file    Gets together: Not on file    Attends  religious service: Not on file    Active member of club or organization: Not on file    Attends meetings of clubs or organizations: Not on file    Relationship status: Not on file  Other Topics Concern  . Not on file  Social History Narrative  . Not on file   Additional Social History:                         Sleep: Poor  Appetite:  Poor  Current Medications: Current Facility-Administered Medications  Medication Dose Route Frequency Provider Last Rate Last Dose  . acetaminophen (TYLENOL) tablet 650 mg  650 mg Oral Q6H PRN Mariel Craft, MD      . albuterol (PROVENTIL) (2.5 MG/3ML) 0.083% nebulizer solution 2.5 mg  2.5 mg Nebulization Q2H PRN Mariel Craft, MD      . alum & mag hydroxide-simeth (MAALOX/MYLANTA) 200-200-20 MG/5ML suspension 30 mL  30 mL Oral Q4H PRN Mariel Craft, MD      . diphenhydrAMINE (BENADRYL) capsule 25 mg  25 mg Oral Q8H PRN Mariel Craft, MD      . diphenhydrAMINE (BENADRYL) capsule 50 mg  50 mg Oral QHS Mariel Craft, MD   50 mg at 03/30/18 2146  .  haloperidol (HALDOL) tablet 10 mg  10 mg Oral QHS Maurer, Sheila M, MD   10 mg at 03/0Mariel Crafthaloperidol (HALDOL) tablet 5 mg  5 mg Oral Daily [name], [name], MD   5 mg at 03/31/18 5284  . haloperidol decanoate (HALDOL DECANOATE) 100 MG/ML injection 150 mg  150 mg Intramuscular Q28 days Brysen Shankman B, MD   150 mg at 03/30/18 1125  . magnesium hydroxide (MILK OF MAGNESIA) suspension 30 mL  30 mL Oral Daily PRN Mariel Craft, MD      . senna-docusate (Senokot-S) tablet 1 tablet  1 tablet Oral QHS PRN Mariel Craft, MD        Lab Results:  Results for orders placed or performed during the hospital encounter of 03/26/18 (from the past 48 hour(s))  Basic metabolic panel     Status: Abnormal   Collection Time: 03/30/18  3:21 PM  Result Value Ref Range   Sodium 138 135 - 145 mmol/L   Potassium 4.1 3.5 - 5.1 mmol/L   Chloride 103 98 - 111 mmol/L   CO2 26 22 - 32 mmol/L    Glucose, Bld 100 (H) 70 - 99 mg/dL   BUN 18 6 - 20 mg/dL   Creatinine, Ser 1.32 0.61 - 1.24 mg/dL   Calcium 8.9 8.9 - 44.0 mg/dL   GFR calc non Af Amer >60 >60 mL/min   GFR calc Af Amer >60 >60 mL/min   Anion gap 9 5 - 15    Comment: Performed at [facility], [address] [place], [place] [address]    Blood Alcohol level:  Lab Results  Component Value Date   [facility] <10 03/25/2018   ETH <10 01/27/2017    Metabolic Disorder Labs: Lab Results  Component Value Date   HGBA1C 5.2 03/27/2018   MPG 102.54 03/27/2018   MPG 102.54 01/31/2017   No results found for: PROLACTIN Lab Results  Component Value Date   CHOL 193 03/27/2018   TRIG 154 (H) 03/27/2018   HDL 51 03/27/2018   CHOLHDL 3.8 03/27/2018   VLDL 31 03/27/2018   LDLCALC 111 (H) 03/27/2018   LDLCALC 102 (H) 01/31/2017    Physical Findings: AIMS:  , ,  ,  ,    CIWA:    COWS:     Musculoskeletal: Strength & Muscle Tone: within normal limits Gait & Station: normal Patient leans: N/A  Psychiatric Specialty Exam: Physical Exam  Nursing note and vitals reviewed. Psychiatric: His affect is blunt. His speech is delayed and tangential. He is slowed, withdrawn and actively hallucinating. Thought content is paranoid and delusional. Cognition and memory are impaired. He expresses impulsivity.    Review of Systems  Neurological: Negative.   Psychiatric/Behavioral: Negative.   All other systems reviewed and are negative.   Blood pressure 134/77, pulse 81, temperature 98.1 F (36.7 C), temperature source Oral, resp. rate 18, SpO2 100 %.There is no height or weight on file to calculate BMI.  General Appearance: Bizarre and Disheveled  Eye Contact:  Good  Speech:  Garbled  Volume:  Normal  Mood:  Euthymic  Affect:  Blunt  Thought Process:  Disorganized, Irrelevant and Descriptions of Associations: Tangential  Orientation:  Full (Time, Place, and Person)  Thought Content:  Delusions, Hallucinations:  Auditory and Paranoid Ideation  Suicidal Thoughts:  No  Homicidal Thoughts:  No  Memory:  Immediate;   Poor Recent;   Poor Remote;   Poor  Judgement:  Impaired  Insight:  Lacking  Psychomotor Activity:  Decreased  Concentration:  Concentration: Poor and Attention Span: Poor  Recall:  Poor  Fund of Knowledge:  Negative  Language:  Fair  Akathisia:  No  Handed:  Right  AIMS (if indicated):     Assets:  Communication Skills Desire for Improvement Financial Resources/Insurance Housing Resilience Social Support  ADL's:  Intact  Cognition:  WNL  Sleep:  Number of Hours: 7     Treatment Plan Summary: Daily contact with patient to assess and evaluate symptoms and progress in treatment and Medication management   Mr. Coro is a 38 year old male with a history of schizophrenia admitted for altered mental status with hyponatremia and polydipsia.   #Psychosis -continue Haldol 10 mg nightly -Haldol decanoate 150 mg every 28 days,last injection on 03/01/2018, due on 03/30/2018 -polydipsia resolved  #Mood -Depakote 1000 mg daily, VPA level on admission low, last night his AST doubled -we will hold Depakote as antiepileptics often contribute to hyponatremia, will monitor  #Hyponatremia -resolved  #Elevated blood pressure -will monitor  #Smoking cessation -nicotine patch is available  #Lab -lipid panel, TSH, A1C -EKG  #Disposition -will return to his group home -follow up with [facility] team   Kristine Linea, MD 03/31/2018, 11:44 AM

## 2018-03-31 NOTE — Plan of Care (Signed)
Problem: Coping: Goal: Coping ability will improve Outcome: Progressing   Problem: Safety: Goal: Ability to disclose and discuss suicidal ideas will improve Outcome: Progressing DAR Note: Pt OOB for medications and meals with prompts. A & O to self and place. Denies SI, HI and pain. Continues to respond to internal stimuli "I hear people talking all the time, and sometimes I see stuff". Eye contact fair, brief but appropriate on interactions. Pt compliant with medications when offered. Denies side effects or discomfort.  Emotional support and reassurance offered to pt as needed. All medications given per provider's with verbal education and effects monitored. Safety checks maintained without self harm or aggressive gestures towards others. Pt encouraged to voice concerns, attend to ADLs and comply with current treatment regimen including groups.  Pt did not attend scheduled groups or shower this shift despite multiple prompts. POC continues for safety and mood stability.

## 2018-04-01 LAB — CREATININE, SERUM
Creatinine, Ser: 1.16 mg/dL (ref 0.61–1.24)
GFR calc Af Amer: >60 mL/min (ref >60)
GFR calc non Af Amer: >60 mL/min (ref >60)

## 2018-04-01 NOTE — Plan of Care (Signed)
Patient has been present in the milieu for meals and returns to his room. Patient verbalizes understanding of the information that's been presented to him, however, this Clinical research associate believes that patient needs reinforcement. Patient denies SI, HI, AVH, and pain at this time. Patient also denies any signs/symptoms of depression and anxiety to this Clinical research associate. Patient's goal for today is "trying to keep my mind clear and do the right thing and get out". Patient has been free from injury thus far and remains safe on the unit at this time.  Problem: Education: Goal: Utilization of techniques to improve thought processes will improve Outcome: Progressing Goal: Knowledge of the prescribed therapeutic regimen will improve Outcome: Progressing   Problem: Activity: Goal: Interest or engagement in leisure activities will improve Outcome: Progressing Goal: Imbalance in normal sleep/wake cycle will improve Outcome: Progressing   Problem: Coping: Goal: Coping ability will improve Outcome: Progressing Goal: Will verbalize feelings Outcome: Progressing   Problem: Health Behavior/Discharge Planning: Goal: Ability to make decisions will improve Outcome: Progressing Goal: Compliance with therapeutic regimen will improve Outcome: Progressing   Problem: Role Relationship: Goal: Will demonstrate positive changes in social behaviors and relationships Outcome: Progressing   Problem: Safety: Goal: Ability to disclose and discuss suicidal ideas will improve Outcome: Progressing Goal: Ability to identify and utilize support systems that promote safety will improve Outcome: Progressing   Problem: Self-Concept: Goal: Will verbalize positive feelings about self Outcome: Progressing Goal: Level of anxiety will decrease Outcome: Progressing

## 2018-04-01 NOTE — Progress Notes (Addendum)
D- Patient alert and oriented. Patient presents in a pleasant mood on assessment stating that he slept "normal" last night and had no major complaints to voice to this Clinical research associate. Patient denies SI, HI, AVH, and pain at this time. Patient also denies any signs/symptoms of depression and anxiety at this time. Patient's goal for today is "trying to keep my mind clear and do the right thing and get out".  A- Scheduled medications administered to patient, per MD orders. Support and encouragement provided.  Routine safety checks conducted every 15 minutes.  Patient informed to notify staff with problems or concerns.  R- No adverse drug reactions noted. Patient contracts for safety at this time. Patient compliant with medications and treatment plan. Patient receptive, calm, and cooperative. Patient interacts well with others on the unit.  Patient remains safe at this time.

## 2018-04-01 NOTE — Progress Notes (Signed)
Patient is alert and oriented x  4, affect is flat but he brightens upon approach , patient appears less psychotic, thoughts are organized and coherent,  patient was not responding   to internal stimuli, his physical hygiene looks improved, he was noted interacting appropriately with peers and staff. 15 minutes safety checks maintained will continue to monitor.

## 2018-04-01 NOTE — [facility] Group Notes (Signed)
LCSW Group Therapy Note  04/01/2018 12:37 PM  Type of Therapy/Topic:  Group Therapy:  Emotion Regulation  Participation Level:  Did Not Attend   Description of Group:   The purpose of this group is to assist patients in learning to regulate negative emotions and experience positive emotions. Patients will be guided to discuss ways in which they have been vulnerable to their negative emotions. These vulnerabilities will be juxtaposed with experiences of positive emotions or situations, and patients will be challenged to use positive emotions to combat negative ones. Special emphasis will be placed on coping with negative emotions in conflict situations, and patients will process healthy conflict resolution skills.  Therapeutic Goals: 1. Patient will identify two positive emotions or experiences to reflect on in order to balance out negative emotions 2. Patient will label two or more emotions that they find the most difficult to experience 3. Patient will demonstrate positive conflict resolution skills through discussion and/or role plays  Summary of Patient Progress: x   Therapeutic Modalities:   Cognitive Behavioral Therapy Feelings Identification Dialectical Behavioral Therapy   Doss Cybulski, MSW, LCSW Clinical Social Work 04/01/2018 12:37 PM    

## 2018-04-01 NOTE — Plan of Care (Signed)
  Problem: Education: Goal: Utilization of techniques to improve thought processes will improve Outcome: Progressing   

## 2018-04-01 NOTE — Progress Notes (Signed)
Recreation Therapy Notes  Date: 04/01/2018  Time: 9:30 am   Location: Craft room   Behavioral response: N/A   Intervention Topic: Coping Skills  Discussion/Intervention: Patient did not attend group.   Clinical Observations/Feedback:  Patient did not attend group.   Lillyanne Bradburn LRT/CTRS         Shane Nash 04/01/2018 11:38 AM 

## 2018-04-01 NOTE — Tx Team (Signed)
Interdisciplinary Treatment and Diagnostic Plan Update  04/01/2018 Time of Session: 830AM Shane Nash MRN: [id number]  Principal Diagnosis: Disorganized schizophrenia [facility])  Secondary Diagnoses: Principal Problem:   Disorganized schizophrenia (HCC) Active Problems:   Hyponatremia   Polydipsia   Tobacco use disorder   Current Medications:  Current Facility-Administered Medications  Medication Dose Route Frequency Provider Last Rate Last Dose  . acetaminophen (TYLENOL) tablet 650 mg  650 mg Oral Q6H PRN Mariel Craft, MD      . albuterol (PROVENTIL) (2.5 MG/3ML) 0.083% nebulizer solution 2.5 mg  2.5 mg Nebulization Q2H PRN Mariel Craft, MD      . alum & mag hydroxide-simeth (MAALOX/MYLANTA) 200-200-20 MG/5ML suspension 30 mL  30 mL Oral Q4H PRN Mariel Craft, MD      . diphenhydrAMINE (BENADRYL) capsule 25 mg  25 mg Oral Q8H PRN Mariel Craft, MD      . diphenhydrAMINE (BENADRYL) capsule 50 mg  50 mg Oral QHS Mariel Craft, MD   50 mg at 03/31/18 2147  . haloperidol (HALDOL) tablet 10 mg  10 mg Oral QHS Mariel Craft, MD   10 mg at 03/31/18 2148  . haloperidol (HALDOL) tablet 5 mg  5 mg Oral Daily [name], [name], MD   5 mg at 04/01/18 5400  . haloperidol decanoate (HALDOL DECANOATE) 100 MG/ML injection 150 mg  150 mg Intramuscular Q28 days Pucilowska, Jolanta B, MD   150 mg at 03/30/18 1125  . magnesium hydroxide (MILK OF MAGNESIA) suspension 30 mL  30 mL Oral Daily PRN Mariel Craft, MD      . senna-docusate (Senokot-S) tablet 1 tablet  1 tablet Oral QHS PRN Mariel Craft, MD       PTA Medications: Medications Prior to Admission  Medication Sig Dispense Refill Last Dose  . divalproex (DEPAKOTE ER) 500 MG 24 hr tablet Take 2 tablets (1,000 mg total) by mouth at bedtime.     . haloperidol (HALDOL) 10 MG tablet Take 1 tablet (10 mg total) by mouth at bedtime.       Patient Stressors:    Patient Strengths:    Treatment Modalities: Medication  Management, Group therapy, Case management,  1 to 1 session with clinician, Psychoeducation, Recreational therapy.   Physician Treatment Plan for Primary Diagnosis: Disorganized schizophrenia (HCC) Long Term Goal(s): Improvement in symptoms so as ready for discharge Improvement in symptoms so as ready for discharge   Short Term Goals: Ability to identify changes in lifestyle to reduce recurrence of condition will improve Ability to verbalize feelings will improve Ability to disclose and discuss suicidal ideas Ability to demonstrate self-control will improve Ability to identify and develop effective coping behaviors will improve Ability to maintain clinical measurements within normal limits will improve Compliance with prescribed medications will improve Ability to identify triggers associated with substance abuse/mental health issues will improve NA  Medication Management: Evaluate patient's response, side effects, and tolerance of medication regimen.  Therapeutic Interventions: 1 to 1 sessions, Unit Group sessions and Medication administration.  Evaluation of Outcomes: Progressing  Physician Treatment Plan for Secondary Diagnosis: Principal Problem:   Disorganized schizophrenia (HCC) Active Problems:   Hyponatremia   Polydipsia   Tobacco use disorder  Long Term Goal(s): Improvement in symptoms so as ready for discharge Improvement in symptoms so as ready for discharge   Short Term Goals: Ability to identify changes in lifestyle to reduce recurrence of condition will improve Ability to verbalize feelings will improve Ability to disclose  and discuss suicidal ideas Ability to demonstrate self-control will improve Ability to identify and develop effective coping behaviors will improve Ability to maintain clinical measurements within normal limits will improve Compliance with prescribed medications will improve Ability to identify triggers associated with substance abuse/mental  health issues will improve NA     Medication Management: Evaluate patient's response, side effects, and tolerance of medication regimen.  Therapeutic Interventions: 1 to 1 sessions, Unit Group sessions and Medication administration.  Evaluation of Outcomes: Progressing   RN Treatment Plan for Primary Diagnosis: Disorganized schizophrenia (HCC) Long Term Goal(s): Knowledge of disease and therapeutic regimen to maintain health will improve  Short Term Goals: Ability to demonstrate self-control, Ability to participate in decision making will improve and Ability to identify and develop effective coping behaviors will improve  Medication Management: RN will administer medications as ordered by provider, will assess and evaluate patient's response and provide education to patient for prescribed medication. RN will report any adverse and/or side effects to prescribing provider.  Therapeutic Interventions: 1 on 1 counseling sessions, Psychoeducation, Medication administration, Evaluate responses to treatment, Monitor vital signs and CBGs as ordered, Perform/monitor CIWA, COWS, AIMS and Fall Risk screenings as ordered, Perform wound care treatments as ordered.  Evaluation of Outcomes: Progressing   LCSW Treatment Plan for Primary Diagnosis: Disorganized schizophrenia (HCC) Long Term Goal(s): Safe transition to appropriate next level of care at discharge, Engage patient in therapeutic group addressing interpersonal concerns.  Short Term Goals: Engage patient in aftercare planning with referrals and resources, Increase emotional regulation, Facilitate acceptance of mental health diagnosis and concerns and Increase skills for wellness and recovery  Therapeutic Interventions: Assess for all discharge needs, 1 to 1 time with Social worker, Explore available resources and support systems, Assess for adequacy in community support network, Educate family and significant other(s) on suicide prevention,  Complete Psychosocial Assessment, Interpersonal group therapy.  Evaluation of Outcomes: Progressing   Progress in Treatment: Attending groups: Yes. Participating in groups: Yes. Taking medication as prescribed: Yes. Toleration medication: Yes. Family/Significant other contact made: Yes, individual(s) contacted:  Sister Berenice Bouton Patient understands diagnosis: Yes. Discussing patient identified problems/goals with staff: Yes. Medical problems stabilized or resolved: Yes. Denies suicidal/homicidal ideation: Yes. Issues/concerns per patient self-inventory: No. Other: n/a  New problem(s) identified: No, Describe:  none  New Short Term/Long Term Goal(s):  medication management for mood stabilization; development of comprehensive mental wellness/sobriety plan.   Patient Goals:  "To get a job"  Discharge Plan or Barriers: SPE pamphlet, Mobile Crisis information, and AA/NA information provided to patient for additional community support and resources. Pt will follow up with his current [facility] ACT team.   Reason for Continuation of Hospitalization: Medication stabilization  Estimated Length of Stay: 5-7 days  Attendees: Patient: 04/01/2018 11:29 AM  Physician:  04/01/2018 11:29 AM  Nursing:  04/01/2018 11:29 AM  RN Care Manager: 04/01/2018 11:29 AM  Social Worker: Zollie Scale Karyssa Amaral LCSW 04/01/2018 11:29 AM  Recreational Therapist:  04/01/2018 11:29 AM  Other:  04/01/2018 11:29 AM  Other:  04/01/2018 11:29 AM  Other: 04/01/2018 11:29 AM    Scribe for Treatment Team: Charlann Lange Andy Moye, LCSW 04/01/2018 11:29 AM

## 2018-04-01 NOTE — Plan of Care (Signed)
  Problem: Coping: Goal: Will verbalize feelings Outcome: Progressing  Patient verbalized feelings to staff.  

## 2018-04-01 NOTE — Progress Notes (Signed)
[facility]  04/01/2018 2:30 PM Rockwell Germanyyrone L Cragle  MRN:  [id number]  Subjective:    Mr. Laural BenesJohnson feels and looks better. His thoughts are better organized to the point that he is able to obfuscate psychotic symptoms, if they are still any. He may need more frequent, every 3 weeks haldol injections to better control his symptoms. Seems well off depakote that could have contributed to hyponatremia.  Principal Problem: Disorganized schizophrenia (HCC) Diagnosis: Principal Problem:   Disorganized schizophrenia (HCC) Active Problems:   Hyponatremia   Polydipsia   Tobacco use disorder  Total Time spent with patient: 15 minutes  Past Psychiatric History: schizophrenia  Past Medical History:  Past Medical History:  Diagnosis Date  . Schizophrenia (HCC)    History reviewed. No pertinent surgical history. Family History: History reviewed. No pertinent family history. Family Psychiatric  History: none reported Social History:  Social History   Substance and Sexual Activity  Alcohol Use Not on file     Social History   Substance and Sexual Activity  Drug Use No    Social History   Socioeconomic History  . Marital status: Single    Spouse name: Not on file  . Number of children: Not on file  . Years of education: Not on file  . Highest education level: Not on file  Occupational History  . Not on file  Social Needs  . Financial resource strain: Not on file  . Food insecurity:    Worry: Not on file    Inability: Not on file  . Transportation needs:    Medical: Not on file    Non-medical: Not on file  Tobacco Use  . Smoking status: Current Every Day Smoker    Packs/day: 0.25    Years: 15.00    Pack years: 3.75    Types: Cigarettes  . Smokeless tobacco: Never Used  . Tobacco comment: Cannot state the amount he smokes per day  Substance and Sexual Activity  . Alcohol use: Not on file  . Drug use: No  . Sexual activity: Not Currently  Lifestyle  . Physical  activity:    Days per week: Not on file    Minutes per session: Not on file  . Stress: Not on file  Relationships  . Social connections:    Talks on phone: Not on file    Gets together: Not on file    Attends religious service: Not on file    Active member of club or organization: Not on file    Attends meetings of clubs or organizations: Not on file    Relationship status: Not on file  Other Topics Concern  . Not on file  Social History Narrative  . Not on file   Additional Social History:                         Sleep: Fair  Appetite:  Fair  Current Medications: Current Facility-Administered Medications  Medication Dose Route Frequency Provider Last Rate Last Dose  . acetaminophen (TYLENOL) tablet 650 mg  650 mg Oral Q6H PRN Mariel CraftMaurer, Sheila M, MD      . albuterol (PROVENTIL) (2.5 MG/3ML) 0.083% nebulizer solution 2.5 mg  2.5 mg Nebulization Q2H PRN Mariel CraftMaurer, Sheila M, MD      . alum & mag hydroxide-simeth (MAALOX/MYLANTA) 200-200-20 MG/5ML suspension 30 mL  30 mL Oral Q4H PRN Mariel CraftMaurer, Sheila M, MD      . diphenhydrAMINE (BENADRYL) capsule 25 mg  25 mg Oral Q8H PRN Mariel Craft, MD      . diphenhydrAMINE (BENADRYL) capsule 50 mg  50 mg Oral QHS Mariel Craft, MD   50 mg at 03/31/18 2147  . haloperidol (HALDOL) tablet 10 mg  10 mg Oral QHS Mariel Craft, MD   10 mg at 03/31/18 2148  . haloperidol (HALDOL) tablet 5 mg  5 mg Oral Daily [name], [name], MD   5 mg at 04/01/18 0165  . haloperidol decanoate (HALDOL DECANOATE) 100 MG/ML injection 150 mg  150 mg Intramuscular Q28 days Audie Wieser B, MD   150 mg at 03/30/18 1125  . magnesium hydroxide (MILK OF MAGNESIA) suspension 30 mL  30 mL Oral Daily PRN Mariel Craft, MD      . senna-docusate (Senokot-S) tablet 1 tablet  1 tablet Oral QHS PRN Mariel Craft, MD        Lab Results:  Results for orders placed or performed during the hospital encounter of 03/26/18 (from the past 48 hour(s))  Basic  metabolic panel     Status: Abnormal   Collection Time: 03/30/18  3:21 PM  Result Value Ref Range   Sodium 138 135 - 145 mmol/L   Potassium 4.1 3.5 - 5.1 mmol/L   Chloride 103 98 - 111 mmol/L   CO2 26 22 - 32 mmol/L   Glucose, Bld 100 (H) 70 - 99 mg/dL   BUN 18 6 - 20 mg/dL   Creatinine, Ser 5.37 0.61 - 1.24 mg/dL   Calcium 8.9 8.9 - 48.2 mg/dL   GFR calc non Af Amer >60 >60 mL/min   GFR calc Af Amer >60 >60 mL/min   Anion gap 9 5 - 15    Comment: Performed at [facility], [address] [place], [place] [address]  Creatinine, serum     Status: None   Collection Time: 04/01/18  6:48 AM  Result Value Ref Range   Creatinine, Ser 1.16 0.61 - 1.24 mg/dL   GFR calc non Af Amer >60 >60 mL/min   GFR calc Af Amer >60 >60 mL/min    Comment: Performed at [facility], [address] [place], [place] [address]    Blood Alcohol level:  Lab Results  Component Value Date   [facility] <10 03/25/2018   ETH <10 01/27/2017    Metabolic Disorder Labs: Lab Results  Component Value Date   HGBA1C 5.2 03/27/2018   MPG 102.54 03/27/2018   MPG 102.54 01/31/2017   No results found for: PROLACTIN Lab Results  Component Value Date   CHOL 193 03/27/2018   TRIG 154 (H) 03/27/2018   HDL 51 03/27/2018   CHOLHDL 3.8 03/27/2018   VLDL 31 03/27/2018   LDLCALC 111 (H) 03/27/2018   LDLCALC 102 (H) 01/31/2017    Physical Findings: AIMS: Facial and Oral Movements Muscles of Facial Expression: None, normal Lips and Perioral Area: None, normal Jaw: None, normal Tongue: None, normal,Extremity Movements Upper (arms, wrists, hands, fingers): None, normal Lower (legs, knees, ankles, toes): None, normal, Trunk Movements Neck, shoulders, hips: None, normal, Overall Severity Severity of abnormal movements (highest score from questions above): None, normal Incapacitation due to abnormal movements: None, normal Patient's awareness of abnormal movements (rate only patient's report): No  Awareness, Dental Status Current problems with teeth and/or dentures?: No Does patient usually wear dentures?: No  CIWA:    COWS:     Musculoskeletal: Strength & Muscle Tone: within normal limits Gait & Station: normal Patient leans:  N/A  Psychiatric Specialty Exam: Physical Exam  Nursing note and vitals reviewed. Psychiatric: His affect is blunt. His speech is delayed. He is actively hallucinating. Thought content is delusional. Cognition and memory are impaired. He expresses impulsivity.    Review of Systems  Neurological: Negative.   Psychiatric/Behavioral: Negative.   All other systems reviewed and are negative.   Blood pressure 115/82, pulse 73, temperature 98.1 F (36.7 C), temperature source Oral, resp. rate 18, SpO2 100 %.There is no height or weight on file to calculate BMI.  General Appearance: Disheveled  Eye Contact:  Good  Speech:  Clear and Coherent  Volume:  Normal  Mood:  Euthymic  Affect:  Blunt  Thought Process:  Irrelevant  Orientation:  Full (Time, Place, and Person)  Thought Content:  Delusions, Hallucinations: Auditory and Paranoid Ideation  Suicidal Thoughts:  No  Homicidal Thoughts:  No  Memory:  Immediate;   Poor Recent;   Poor Remote;   Poor  Judgement:  Poor  Insight:  Lacking  Psychomotor Activity:  Normal  Concentration:  Concentration: Poor and Attention Span: Poor  Recall:  Poor  Fund of Knowledge:  Poor  Language:  Poor  Akathisia:  No  Handed:  Right  AIMS (if indicated):     Assets:  Communication Skills Desire for Improvement Financial Resources/Insurance Housing Physical Health Resilience Social Support  ADL's:  Intact  Cognition:  Impaired,  Mild  Sleep:  Number of Hours: 5     Treatment Plan Summary: Daily contact with patient to assess and evaluate symptoms and progress in treatment and Medication management   Mr. Stoen is a 38 year old male with a history of schizophrenia admitted for altered mental status with  hyponatremia and polydipsia.   #Psychosis -continue Haldol 10 mg nightly -Haldol decanoate 150 mg every 28 days,last injection on 03/01/2018, due on 03/30/2018 -polydipsia resolved  #Mood -Depakote 1000 mg daily, VPA level on admission low, last night his AST doubled -we will hold Depakote as antiepileptics often contribute to hyponatremia, will monitor  #Hyponatremia -resolved  #Smoking cessation -nicotine patch is available  #Lab -lipid panel, TSH, A1C are normal except slightly elevated TG -EKG, sinus rhythm with QTc 414  #Disposition -will return to the boarding house, to his mother  -follow up with [facility] team  Kristine Linea, MD 04/01/2018, 2:30 PM

## 2018-04-02 MED ORDER — BENZTROPINE MESYLATE 1 MG PO TABS
2.0000 mg | ORAL_TABLET | Freq: Every day | ORAL | Status: DC
  Administered 2018-04-02 – 2018-04-03 (×2): 2 mg via ORAL
  Filled 2018-04-02 (×2): qty 2

## 2018-04-02 MED ORDER — BENZTROPINE MESYLATE 2 MG PO TABS: 2.0000 mg | ORAL_TABLET | Freq: Every day | ORAL | 1 refills | Status: DC

## 2018-04-02 MED ORDER — HALOPERIDOL DECANOATE 100 MG/ML IM SOLN: 150.0000 mg | INTRAMUSCULAR | 1 refills | Status: DC

## 2018-04-02 MED ORDER — DIPHENHYDRAMINE HCL 50 MG PO CAPS: 50.0000 mg | ORAL_CAPSULE | Freq: Every day | ORAL | 1 refills | Status: DC

## 2018-04-02 NOTE — Plan of Care (Signed)
Patient denies SI,HI and AVH.Patient is appropriate in the unit.Minimal interactions with staff & peers.Patient stated that he is ready for discharge.Appetite and energy level good.Compliant with medications.Support and encouragement given.

## 2018-04-02 NOTE — Progress Notes (Signed)
Patient alert and oriented x 4, denies SI/HI/AVH, thoughts are organized and coherent, interacting appropriately with peers and staff, affect is flat but brightens upon approach, 15 minutes safety checks maintained will continue to monitor

## 2018-04-02 NOTE — Progress Notes (Signed)
[facility] MD Progress Note  04/02/2018 1:53 PM BUN RINNE  MRN:  [id number]  Subjective:    Mr. Lebert ready for discharge. ACT team will pick up tomorrow.  Principal Problem: Disorganized schizophrenia (HCC) Diagnosis: Principal Problem:   Disorganized schizophrenia (HCC) Active Problems:   Hyponatremia   Polydipsia   Tobacco use disorder  Total Time spent with patient: 15 minutes  Past Psychiatric History: schizophrenia  Past Medical History:  Past Medical History:  Diagnosis Date  . Schizophrenia (HCC)    History reviewed. No pertinent surgical history. Family History: History reviewed. No pertinent family history. Family Psychiatric  History: none Social History:  Social History   Substance and Sexual Activity  Alcohol Use Not on file     Social History   Substance and Sexual Activity  Drug Use No    Social History   Socioeconomic History  . Marital status: Single    Spouse name: Not on file  . Number of children: Not on file  . Years of education: Not on file  . Highest education level: Not on file  Occupational History  . Not on file  Social Needs  . Financial resource strain: Not on file  . Food insecurity:    Worry: Not on file    Inability: Not on file  . Transportation needs:    Medical: Not on file    Non-medical: Not on file  Tobacco Use  . Smoking status: Current Every Day Smoker    Packs/day: 0.25    Years: 15.00    Pack years: 3.75    Types: Cigarettes  . Smokeless tobacco: Never Used  . Tobacco comment: Cannot state the amount he smokes per day  Substance and Sexual Activity  . Alcohol use: Not on file  . Drug use: No  . Sexual activity: Not Currently  Lifestyle  . Physical activity:    Days per week: Not on file    Minutes per session: Not on file  . Stress: Not on file  Relationships  . Social connections:    Talks on phone: Not on file    Gets together: Not on file    Attends religious service: Not on file    Active  member of club or organization: Not on file    Attends meetings of clubs or organizations: Not on file    Relationship status: Not on file  Other Topics Concern  . Not on file  Social History Narrative  . Not on file   Additional Social History:                         Sleep: Fair  Appetite:  Fair  Current Medications: Current Facility-Administered Medications  Medication Dose Route Frequency Provider Last Rate Last Dose  . acetaminophen (TYLENOL) tablet 650 mg  650 mg Oral Q6H PRN Mariel Craft, MD      . albuterol (PROVENTIL) (2.5 MG/3ML) 0.083% nebulizer solution 2.5 mg  2.5 mg Nebulization Q2H PRN Mariel Craft, MD      . alum & mag hydroxide-simeth (MAALOX/MYLANTA) 200-200-20 MG/5ML suspension 30 mL  30 mL Oral Q4H PRN Mariel Craft, MD      . benztropine (COGENTIN) tablet 2 mg  2 mg Oral Daily ,  B, MD   2 mg at 04/02/18 1042  . diphenhydrAMINE (BENADRYL) capsule 25 mg  25 mg Oral Q8H PRN Mariel Craft, MD      . diphenhydrAMINE (BENADRYL) capsule  50 mg  50 mg Oral QHS Mariel Craft, MD   50 mg at 04/01/18 2115  . haloperidol (HALDOL) tablet 10 mg  10 mg Oral QHS Mariel Craft, MD   10 mg at 04/01/18 2115  . haloperidol (HALDOL) tablet 5 mg  5 mg Oral Daily [name], [name], MD   5 mg at 04/02/18 0804  . haloperidol decanoate (HALDOL DECANOATE) 100 MG/ML injection 150 mg  150 mg Intramuscular Q28 days ,  B, MD   150 mg at 03/30/18 1125  . magnesium hydroxide (MILK OF MAGNESIA) suspension 30 mL  30 mL Oral Daily PRN Mariel Craft, MD      . senna-docusate (Senokot-S) tablet 1 tablet  1 tablet Oral QHS PRN Mariel Craft, MD        Lab Results:  Results for orders placed or performed during the hospital encounter of 03/26/18 (from the past 48 hour(s))  Creatinine, serum     Status: None   Collection Time: 04/01/18  6:48 AM  Result Value Ref Range   Creatinine, Ser 1.16 0.61 - 1.24 mg/dL   GFR calc non Af Amer  >60 >60 mL/min   GFR calc Af Amer >60 >60 mL/min    Comment: Performed at [facility], [address] [place], [place] [address]    Blood Alcohol level:  Lab Results  Component Value Date   [facility] <10 03/25/2018   ETH <10 01/27/2017    Metabolic Disorder Labs: Lab Results  Component Value Date   HGBA1C 5.2 03/27/2018   MPG 102.54 03/27/2018   MPG 102.54 01/31/2017   No results found for: PROLACTIN Lab Results  Component Value Date   CHOL 193 03/27/2018   TRIG 154 (H) 03/27/2018   HDL 51 03/27/2018   CHOLHDL 3.8 03/27/2018   VLDL 31 03/27/2018   LDLCALC 111 (H) 03/27/2018   LDLCALC 102 (H) 01/31/2017    Physical Findings: AIMS: Facial and Oral Movements Muscles of Facial Expression: None, normal Lips and Perioral Area: None, normal Jaw: None, normal Tongue: None, normal,Extremity Movements Upper (arms, wrists, hands, fingers): None, normal Lower (legs, knees, ankles, toes): None, normal, Trunk Movements Neck, shoulders, hips: None, normal, Overall Severity Severity of abnormal movements (highest score from questions above): None, normal Incapacitation due to abnormal movements: None, normal Patient's awareness of abnormal movements (rate only patient's report): No Awareness, Dental Status Current problems with teeth and/or dentures?: No Does patient usually wear dentures?: No  CIWA:    COWS:     Musculoskeletal: Strength & Muscle Tone: within normal limits Gait & Station: normal Patient leans: N/A  Psychiatric Specialty Exam: Physical Exam  Nursing note and vitals reviewed. Psychiatric: He has a normal mood and affect. His speech is normal and behavior is normal. Judgment and thought content normal. Cognition and memory are normal.    Review of Systems  Neurological: Negative.   Psychiatric/Behavioral: Negative.   All other systems reviewed and are negative.   Blood pressure 111/81, pulse 66, temperature 97.9 F (36.6 C), temperature source  Oral, resp. rate 18, SpO2 100 %.There is no height or weight on file to calculate BMI.  General Appearance: Fairly Groomed  Eye Contact:  Absent  Speech:  Clear and Coherent  Volume:  Normal  Mood:  Euthymic  Affect:  Flat  Thought Process:  Goal Directed and Descriptions of Associations: Intact  Orientation:  Full (Time, Place, and Person)  Thought Content:  WDL  Suicidal Thoughts:  No  Homicidal  Thoughts:  No  Memory:  Immediate;   Poor Recent;   Poor Remote;   Poor  Judgement:  Poor  Insight:  Lacking  Psychomotor Activity:  Normal  Concentration:  Concentration: Poor and Attention Span: Poor  Recall:  Poor  Fund of Knowledge:  Poor  Language:  Poor  Akathisia:  No  Handed:  Right  AIMS (if indicated):     Assets:  Communication Skills Desire for Improvement Housing Physical Health Resilience Social Support  ADL's:  Intact  Cognition:  WNL  Sleep:  Number of Hours: 7.75     Treatment Plan Summary: Daily contact with patient to assess and evaluate symptoms and progress in treatment and Medication management   Mr. Kipps is a 38 year old male with a history of schizophrenia admitted for altered mental status with hyponatremia and polydipsia.   #Psychosis -continue Haldol 10 mg nightly -Haldol decanoate 150 mg every 28 days,last injection on 03/01/2018, due on 03/30/2018 -polydipsia resolved  #Mood -Depakote 1000 mg daily, VPA level on admission low, last night his AST doubled -we will hold Depakote as antiepileptics often contribute to hyponatremia, will monitor  #Hyponatremia -resolved  #Smoking cessation -nicotine patch is available  #Lab -lipid panel, TSH, A1C are normal except slightly elevated TG -EKG, sinus rhythm with QTc 414  #Disposition -will return to the boarding house, to his mother  -follow up with [facility] ACT team   Kristine Linea, MD 04/02/2018, 1:53 PM

## 2018-04-02 NOTE — Progress Notes (Signed)
Recreation Therapy Notes   Date: 04/02/2018  Time: 9:30 am   Location: Craft room   Behavioral response: N/A   Intervention Topic: Relaxation  Discussion/Intervention: Patient did not attend group.   Clinical Observations/Feedback:  Patient did not attend group.   Kenise Barraco LRT/CTRS        Khaleelah Yowell 04/02/2018 11:14 AM

## 2018-04-02 NOTE — BHH Counselor (Signed)
CSW spoke with Helmut Muster at [facility] to inform her that Dr. Demetrius Charity was ok with discharging the patient on 04/03/2018 to allow for the ACT team to pick him up.  CSW requested that the ACT team call about 30 minutes in advance to ensure that the patient is ready with all parties.   Penni Homans, MSW, LCSW 04/02/2018 2:29 PM

## 2018-04-02 NOTE — BHH Group Notes (Signed)
LCSW Group Therapy Note  04/02/2018 1:00 PM  Type of Therapy/Topic:  Group Therapy:  Balance in Life  Participation Level:  Did Not Attend  Description of Group:    This group will address the concept of balance and how it feels and looks when one is unbalanced. Patients will be encouraged to process areas in their lives that are out of balance and identify reasons for remaining unbalanced. Facilitators will guide patients in utilizing problem-solving interventions to address and correct the stressor making their life unbalanced. Understanding and applying boundaries will be explored and addressed for obtaining and maintaining a balanced life. Patients will be encouraged to explore ways to assertively make their unbalanced needs known to significant others in their lives, using other group members and facilitator for support and feedback.  Therapeutic Goals: 1. Patient will identify two or more emotions or situations they have that consume much of in their lives. 2. Patient will identify signs/triggers that life has become out of balance:  3. Patient will identify two ways to set boundaries in order to achieve balance in their lives:  4. Patient will demonstrate ability to communicate their needs through discussion and/or role plays  Summary of Patient Progress:  X    Therapeutic Modalities:   Cognitive Behavioral Therapy Solution-Focused Therapy Assertiveness Training  Penni Homans MSW, LCSW 04/02/2018 2:25 PM

## 2018-04-02 NOTE — BHH Counselor (Signed)
CSW spoke with Elease Hashimoto at [facility] who reports that "I've been trying to reach you."  CSW pointed out that there were no missed calls to CSW work cell, no message and other CSW's on the unit report that they were not contacted by Elease Hashimoto.  Elease Hashimoto voiced concerns at being made aware at the last minute that the patient is being discharged and she wanted a day in advance notice.  She also expressed that no one from ACT will be able to pick the patient up at this time due to being short staffed.  She reports "this is not a proper discharge".  She reports that ACT team can pick up the patient between 10 and 11 on 04/03/2018.  She reports that she had wanted the CSW's help with pursuing guardianship.  CSW recommended that ACT team follow up with [facility].  Penni Homans, MSW, LCSW 04/02/2018 12:58 PM

## 2018-04-02 NOTE — Discharge Summary (Addendum)
Physician Discharge Summary Note  Patient:  Shane Nash is an 38 y.o., male MRN:  [id number] DOB:  Jun 06, 1980 Patient phone:  [phone] (home)  Patient address:   [address] [place] [address],  Total Time spent with patient: 20 minutes plus 15 min on care coordination and documentation  Date of Admission:  03/26/2018 Date of Discharge: 04/02/2018  Reason for Admission:  Altered mental status.  History of Present Illness:   Identifying data. Shane Nash is a 38 year old male with a history of schizophrenia.  Chief complaint. "I feel good."  History of present illness. Information was obtained from the patient and the chart. The patient called EMS complaining of feeling thirsty and drinking water all the time, not feeling good. Not feeling good. Indeed, in the ER his sodium was low and had to be stabilized by medical team. In the ER he was very confused unable to answer simple questions. This confusion persisted until today. H is able to meet with treatment team and provide some information: he is still with [facility] ACT team, received Haldol decanoate injection 2 weeks ago.He also admits that he stopped taking medications because they were "too strong for him".  I have known the patient for several years. He usually does well on a combination of antipsychotic and Depakote. There were episodes of polydipsia and hyponatremia in the past. His VPA level on admission was low so it is hard to guess ife there was any effect of antiepileptics on sodium.  The patient himself, denies any problems now since excessive drinking resolved.  Past psychiatric history. Diagnosed in his twenties. He rarerly ends up in the hospital.   Family psychiatric history. None.  Social history. No longer lives in a group home but with his mother at a boarding house. They are looking for an apartment.  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 Schizophrenia (HCC)    History reviewed. No pertinent surgical history. Family History: History reviewed. No pertinent family history.  Social History:  Social History   Substance and Sexual Activity  Alcohol Use Not on file     Social History   Substance and Sexual Activity  Drug Use No    Social History   Socioeconomic History  . Marital status: Single    Spouse name: Not on file  . Number of children: Not on file  . Years of education: Not on file  . Highest education level: Not on file  Occupational History  . Not on file  Social Needs  . Financial resource strain: Not on file  . Food insecurity:    Worry: Not on file    Inability: Not on file  . Transportation needs:    Medical: Not on file    Non-medical: Not on file  Tobacco Use  . Smoking status: Current Every Day Smoker    Packs/day: 0.25    Years: 15.00    Pack years: 3.75    Types: Cigarettes  . Smokeless tobacco: Never Used  . Tobacco comment: Cannot state the amount he smokes per day  Substance and Sexual Activity  . Alcohol use: Not on file  . Drug use: No  . Sexual activity: Not Currently  Lifestyle  . Physical activity:    Days per week: Not on file  Minutes per session: Not on file  . Stress: Not on file  Relationships  . Social connections:    Talks on phone: Not on file    Gets together: Not on file    Attends religious service: Not on file    Active member of club or organization: Not on file    Attends meetings of clubs or organizations: Not on file    Relationship status: Not on file  Other Topics Concern  . Not on file  Social History Narrative  . Not on file    Hospital Course:    Shane Nash is a 38 year old male with a history of schizophrenia admitted for altered mental status with hyponatremia and  polydipsia. Hyponatremia was treated by medical servis with full resolution of symptoms. We continue Haldol and gave Haldol decanoate 150 mg injectio to improve compliance. We discontinued Depakote as this could have contributed to hyponatremia. The patient may be rechallanged at the lated time if necessary.  #Psychosis, improved -Haldol decanoate 150 mg every 28 days,next injection due on 05/01/2018 -Cogentin 2 mg daily was added to address slightly slurred speech -Benadryl 50 mg nightly for sleep -discontinue Depakote  #Hyponatremia -resolved  #Smoking cessation -nicotine patch is available  #Lab -lipid panel, TSH, A1C are normal except slightly elevated TG -EKG, sinus rhythm with QTc 414  #Disposition -discharge to the boarding house with his mother  -follow up with [facility] ACT team  Physical Findings: AIMS: Facial and Oral Movements Muscles of Facial Expression: None, normal Lips and Perioral Area: None, normal Jaw: None, normal Tongue: None, normal,Extremity Movements Upper (arms, wrists, hands, fingers): None, normal Lower (legs, knees, ankles, toes): None, normal, Trunk Movements Neck, shoulders, hips: None, normal, Overall Severity Severity of abnormal movements (highest score from questions above): None, normal Incapacitation due to abnormal movements: None, normal Patient's awareness of abnormal movements (rate only patient's report): No Awareness, Dental Status Current problems with teeth and/or dentures?: No Does patient usually wear dentures?: No  CIWA:    COWS:     Musculoskeletal: Strength & Muscle Tone: within normal limits Gait & Station: normal Patient leans: N/A  Psychiatric Specialty Exam: Physical Exam  Nursing note and vitals reviewed. Psychiatric: He has a normal mood and affect. His behavior is normal. Judgment and thought content normal. His speech is slurred. Cognition and memory are normal.    Review of Systems  Neurological: Negative.    Psychiatric/Behavioral: Negative.   All other systems reviewed and are negative.   Blood pressure (!) 131/93, pulse 67, temperature 98.4 F (36.9 C), temperature source Oral, resp. rate 18, SpO2 97 %.There is no height or weight on file to calculate BMI.  General Appearance: Casual  Eye Contact:  Good  Speech:  Slurred  Volume:  Normal  Mood:  Euthymic  Affect:  Flat  Thought Process:  Goal Directed and Descriptions of Associations: Intact  Orientation:  Full (Time, Place, and Person)  Thought Content:  WDL  Suicidal Thoughts:  No  Homicidal Thoughts:  No  Memory:  Immediate;   Fair Recent;   Fair Remote;   Fair  Judgement:  Impaired  Insight:  Shallow  Psychomotor Activity:  Normal  Concentration:  Concentration: Fair and Attention Span: Fair  Recall:  [employer] of Knowledge:  Fair  Language:  Fair  Akathisia:  No  Handed:  Right  AIMS (if indicated):     Assets:  Communication Skills Desire for Improvement Financial Resources/Insurance Housing Physical Health Resilience Social Support  ADL's:  Intact  Cognition:  WNL  Sleep:  Number of Hours: 1.25     Have you used any form of tobacco in the last 30 days? (Cigarettes, Smokeless Tobacco, Cigars, and/or Pipes): Yes  Has this patient used any form of tobacco in the last 30 days? (Cigarettes, Smokeless Tobacco, Cigars, and/or Pipes) Yes, Yes, A prescription for an FDA-approved tobacco cessation medication was offered at discharge and the patient refused  Blood Alcohol level:  Lab Results  Component Value Date   [facility] <10 03/25/2018   ETH <10 01/27/2017    Metabolic Disorder Labs:  Lab Results  Component Value Date   HGBA1C 5.2 03/27/2018   MPG 102.54 03/27/2018   MPG 102.54 01/31/2017   No results found for: PROLACTIN Lab Results  Component Value Date   CHOL 193 03/27/2018   TRIG 154 (H) 03/27/2018   HDL 51 03/27/2018   CHOLHDL 3.8 03/27/2018   VLDL 31 03/27/2018   LDLCALC 111 (H) 03/27/2018    LDLCALC 102 (H) 01/31/2017    See Psychiatric Specialty Exam and Suicide Risk Assessment completed by Attending Physician prior to discharge.  Discharge destination:  Home  Is patient on multiple antipsychotic therapies at discharge:  No   Has Patient had three or more failed trials of antipsychotic monotherapy by history:  No  Recommended Plan for Multiple Antipsychotic Therapies: NA  Discharge Instructions    Diet - low sodium heart healthy   Complete by:  As directed    Increase activity slowly   Complete by:  As directed      Allergies as of 04/03/2018   No Known Allergies     Medication List    STOP taking these medications   divalproex 500 MG 24 hr tablet Commonly known as:  DEPAKOTE ER   haloperidol 10 MG tablet Commonly known as:  HALDOL     TAKE these medications     Indication  benztropine 2 MG tablet Commonly known as:  COGENTIN Take 1 tablet (2 mg total) by mouth daily.  Indication:  Extrapyramidal Reaction caused by Medications   diphenhydrAMINE 50 MG capsule Commonly known as:  BENADRYL Take 1 capsule (50 mg total) by mouth at bedtime.  Indication:  Trouble Sleeping   haloperidol decanoate 100 MG/ML injection Commonly known as:  HALDOL DECANOATE Inject 1.5 mLs (150 mg total) into the muscle every 28 (twenty-eight) days. Start taking on:  April 27, 2018  Indication:  Schizophrenia      Follow-up Information    [facility] Follow up.   Why:  Please follow up with your ACT team once discharged. Contact information: [address] [place] [address] [phone]           Follow-up recommendations:  Activity:  as tolerated Diet:  regular Other:  keep follow up appointments  Comments:     Signed: Kristine Linea, MD 04/03/2018, 9:07 AM

## 2018-04-02 NOTE — Progress Notes (Signed)
Patient is alert and oriented x  4, affect is flat but he brightens upon approach , patient's thoughts are organized and coherent, he denies SI/HI/AVH  noted interacting appropriately with peers and staff. patient was offered emotional support  And encouraged to attend evening group. Patient attends evening and was appropriate, 15 minutes safety checks maintained will continue to monitor.

## 2018-04-02 NOTE — Plan of Care (Signed)
  Problem: Self-Concept: Goal: Level of anxiety will decrease Outcome: Progressing  Patient's anxiety has decreased .

## 2018-04-02 NOTE — Plan of Care (Signed)
  Problem: Education: Goal: Utilization of techniques to improve thought processes will improve Outcome: Progressing  Thoughts are organized and coherent .  

## 2018-04-02 NOTE — [facility] Suicide Risk Assessment (Addendum)
[facility] Discharge Suicide Risk Assessment   Principal Problem: Disorganized schizophrenia [facility]) Discharge Diagnoses: Principal Problem:   Disorganized schizophrenia (HCC) Active Problems:   Hyponatremia   Polydipsia   Tobacco use disorder   Total Time spent with patient: 20 minutes  Musculoskeletal: Strength & Muscle Tone: within normal limits Gait & Station: normal Patient leans: N/A  Psychiatric Specialty Exam: Review of Systems  Neurological: Negative.   Psychiatric/Behavioral: Negative.   All other systems reviewed and are negative.   Blood pressure (!) 131/93, pulse 67, temperature 98.4 F (36.9 C), temperature source Oral, resp. rate 18, SpO2 97 %.There is no height or weight on file to calculate BMI.  General Appearance: Casual  Eye Contact::  Good  Speech:  Clear and Coherent409  Volume:  Normal  Mood:  Euthymic  Affect:  Appropriate  Thought Process:  Goal Directed and Descriptions of Associations: Intact  Orientation:  Full (Time, Place, and Person)  Thought Content:  WDL  Suicidal Thoughts:  No  Homicidal Thoughts:  No  Memory:  Immediate;   Fair Recent;   Fair Remote;   Fair  Judgement:  Impaired  Insight:  Shallow  Psychomotor Activity:  Normal  Concentration:  Fair  Recall:  [employer] of Knowledge:Fair  Language: Fair  Akathisia:  No  Handed:  Right  AIMS (if indicated):     Assets:  Communication Skills Desire for Improvement Financial Resources/Insurance Housing Physical Health Resilience Social Support  Sleep:  Number of Hours: 1.25  Cognition: WNL  ADL's:  Intact   Mental Status Per Nursing Assessment::   On Admission:  NA  Demographic Factors:  Male  Loss Factors: NA  Historical Factors: Impulsivity  Risk Reduction Factors:   Sense of responsibility to family, Living with another person, especially a relative, Positive social support and Positive therapeutic relationship  Continued Clinical Symptoms:  Schizophrenia:   Less  than 23 years old Paranoid or undifferentiated type  Cognitive Features That Contribute To Risk:  None    Suicide Risk:  Minimal: No identifiable suicidal ideation.  Patients presenting with no risk factors but with morbid ruminations; may be classified as minimal risk based on the severity of the depressive symptoms  Follow-up Information    [facility] Follow up.   Why:  Please follow up with your ACT team once discharged. Contact information: [address] [place] [address] [phone]           Plan Of Care/Follow-up recommendations:  Activity:  as tolerated Diet:  low sodium heart healthy Other:  keep follow up appointments  Kristine Linea, MD 04/03/2018, 9:06 AM

## 2018-04-03 ENCOUNTER — Inpatient Hospital Stay: Payer: Medicaid Other

## 2018-04-03 NOTE — Progress Notes (Signed)
  [facility] Adult Case Management Discharge Plan :  Will you be returning to the same living situation after discharge:  Yes,  pt will be returning to his boarding room with his mother.  At discharge, do you have transportation home?: Yes,  [facility] will provide tranportation. Do you have the ability to pay for your medications: Yes,  Medicaid.  Release of information consent forms completed and in the chart;  Patient's signature needed at discharge.  Patient to Follow up at: Follow-up Information    [facility] Follow up.   Why:  Please follow up with your ACT team once discharged. Contact information: [address] [place] [address] [phone]           Next level of care provider has access to [facility] Link:no  Safety Planning and Suicide Prevention discussed: Yes,  SPE completed with the pt sister.  Have you used any form of tobacco in the last 30 days? (Cigarettes, Smokeless Tobacco, Cigars, and/or Pipes): Yes  Has patient been referred to the Quitline?: Patient refused referral  Patient has been referred for addiction treatment: Yes  [name], LCSW 04/03/2018, 8:58 AM

## 2018-04-03 NOTE — Progress Notes (Signed)
Recreation Therapy Notes   Date: 04/03/2018  Time: 9:30 am   Location: Craft room   Behavioral response: N/A   Intervention Topic: Leisure  Discussion/Intervention: Patient did not attend group.   Clinical Observations/Feedback:  Patient did not attend group.   Xzavior Reinig LRT/CTRS        Carnella Fryman 04/03/2018 11:31 AM

## 2018-04-03 NOTE — Progress Notes (Signed)
Patient denies SI/HI, denies A/V hallucinations. Patient verbalizes understanding of discharge instructions, follow up care and prescriptions. Patient given all belongings from [facility] locker. Patient escorted out by staff, transported by ACT team staff.

## 2018-04-03 NOTE — Progress Notes (Signed)
Recreation Therapy Notes  INPATIENT RECREATION TR PLAN  Patient Details Name: Shane Nash MRN: [id number] DOB: 12-28-1980 Today's Date: 04/03/2018  Rec Therapy Plan Is patient appropriate for Therapeutic Recreation?: Yes Treatment times per week: At least 3 Estimated Length of Stay: 5-7 days TR Treatment/Interventions: Group participation (Comment)  Discharge Criteria Pt will be discharged from therapy if:: Discharged Treatment plan/goals/alternatives discussed and agreed upon by:: Patient/family  Discharge Summary Short term goals set: Patient will engage in groups without prompting or encouragement from LRT x3 group sessions within 5 recreation therapy group sessions Short term goals met: Not met Progress toward goals comments: Groups attended Which groups?: Other (Comment)(Self-care) Reason goals not met: Patient spent most of his time in his room Therapeutic equipment acquired: N/A Reason patient discharged from therapy: Discharge from hospital Pt/family agrees with progress & goals achieved: Yes Date patient discharged from therapy: 04/03/18   Omran Keelin 04/03/2018, 1:09 PM

## 2019-05-07 ENCOUNTER — Other Ambulatory Visit: Payer: Self-pay

## 2019-05-07 ENCOUNTER — Emergency Department
Admission: EM | Admit: 2019-05-07 | Discharge: 2019-05-12 | Disposition: A | Discharge status: Home / Self Care | Payer: Medicaid Other | Attending: Emergency Medicine | Admitting: Emergency Medicine

## 2019-05-07 DIAGNOSIS — Z20822 Contact with and (suspected) exposure to covid-19: Secondary | ICD-10-CM | POA: Insufficient documentation

## 2019-05-07 DIAGNOSIS — F458 Other somatoform disorders: Secondary | ICD-10-CM | POA: Diagnosis not present

## 2019-05-07 DIAGNOSIS — Z79899 Other long term (current) drug therapy: Secondary | ICD-10-CM | POA: Diagnosis not present

## 2019-05-07 DIAGNOSIS — F200 Paranoid schizophrenia: Secondary | ICD-10-CM | POA: Diagnosis not present

## 2019-05-07 DIAGNOSIS — F203 Undifferentiated schizophrenia: Secondary | ICD-10-CM | POA: Diagnosis not present

## 2019-05-07 DIAGNOSIS — F17210 Nicotine dependence, cigarettes, uncomplicated: Secondary | ICD-10-CM | POA: Diagnosis not present

## 2019-05-07 DIAGNOSIS — F209 Schizophrenia, unspecified: Secondary | ICD-10-CM | POA: Diagnosis present

## 2019-05-07 DIAGNOSIS — F54 Psychological and behavioral factors associated with disorders or diseases classified elsewhere: Secondary | ICD-10-CM

## 2019-05-07 DIAGNOSIS — R4182 Altered mental status, unspecified: Secondary | ICD-10-CM | POA: Diagnosis present

## 2019-05-07 LAB — COMPREHENSIVE METABOLIC PANEL
ALT: 16 U/L (ref 0–44)
AST: 25 U/L (ref 15–41)
Albumin: 3.8 g/dL (ref 3.5–5.0)
Alkaline Phosphatase: 76 U/L (ref 38–126)
Anion gap: 8 (ref 5–15)
BUN: 7 mg/dL (ref 6–20)
CO2: 24 mmol/L (ref 22–32)
Calcium: 8.1 mg/dL — ABNORMAL LOW (ref 8.9–10.3)
Chloride: 96 mmol/L — ABNORMAL LOW (ref 98–111)
Creatinine, Ser: 0.82 mg/dL (ref 0.61–1.24)
GFR calc Af Amer: >60 mL/min (ref >60)
GFR calc non Af Amer: >60 mL/min (ref >60)
Glucose, Bld: 87 mg/dL (ref 70–99)
Potassium: 3.5 mmol/L (ref 3.5–5.1)
Sodium: 128 mmol/L — ABNORMAL LOW (ref 135–145)
Total Bilirubin: 0.9 mg/dL (ref 0.3–1.2)
Total Protein: 7.3 g/dL (ref 6.5–8.1)

## 2019-05-07 LAB — ACETAMINOPHEN LEVEL: Acetaminophen (Tylenol), Serum: <10 ug/mL — ABNORMAL LOW (ref 10–30)

## 2019-05-07 LAB — CBC
HCT: 36.7 % — ABNORMAL LOW (ref 39.0–52.0)
Hemoglobin: 12.8 g/dL — ABNORMAL LOW (ref 13.0–17.0)
MCH: 31.1 pg (ref 26.0–34.0)
MCHC: 34.9 g/dL (ref 30.0–36.0)
MCV: 89.3 fL (ref 80.0–100.0)
Platelets: 302 10*3/uL (ref 150–400)
RBC: 4.11 MIL/uL — ABNORMAL LOW (ref 4.22–5.81)
RDW: 12.4 % (ref 11.5–15.5)
WBC: 8.3 10*3/uL (ref 4.0–10.5)
nRBC: 0 % (ref 0.0–0.2)

## 2019-05-07 LAB — SALICYLATE LEVEL: Salicylate Lvl: <7 mg/dL — ABNORMAL LOW (ref 7.0–30.0)

## 2019-05-07 LAB — ETHANOL: Alcohol, Ethyl (B): <10 mg/dL (ref <10)

## 2019-05-07 MED ORDER — SODIUM CHLORIDE 0.9 % IV BOLUS: 1000.0000 mL | Freq: Once | INTRAVENOUS | Status: DC

## 2019-05-07 NOTE — ED Notes (Signed)
Introduced self to patient.  Pt. Asked "Why are you at hospital today?, Pt. Stated, "I had sex".  Pt. Appears confused as to why he is here.  Pt. Is calm and cooperative at this time.  Pt. Is here under IVC.  Pt. Requested and was given meal tray and drink.

## 2019-05-07 NOTE — ED Notes (Signed)
Patient would not participate with the assessor.

## 2019-05-07 NOTE — ED Notes (Signed)
Pt urinated on the floor and provided with towels to clean it up

## 2019-05-07 NOTE — ED Triage Notes (Addendum)
Per [employer], patient was visited today from ACT team, who IVC'd the patient after it was discovered that the patient was not taking his medications, was delusional and hallucinating, and had disorganized thinking. Patient has schizoaffective disorder. Patient also was urinating in bottles and defecating in bags and keeping them in his room. Patient arrived in handcuffs which were removed by the officer.Patient has been cooperative, but didn't want to go to the bathroom to give urine specimen, but instead wanting to urinate in the cup in the triage room. Patient did go to the hallway bathroom.

## 2019-05-07 NOTE — ED Provider Notes (Signed)
psychos  [facility] Emergency Department Provider Note  ____________________________________________   First MD Initiated Contact with Patient 05/07/19 1948     (approximate)  I have reviewed the triage vital signs and the nursing notes.   HISTORY  Chief Complaint Altered Mental Status    HPI Shane Nash is a 39 y.o. male with history of schizophrenia here with altered mental status.  Per report, the patient has been off his medications.  He was found in his house by ACT team.  He was delusional, hallucinating, and had been storing his urine and bottles and defecating and bags.  He has a history of similar issues when he stopped taking his medications.  On my interview, he is somewhat evasive and possibly responding to internal stimuli.  He states he does not need his medications.  He does not know why he is here.  He is requesting to go home.  When I discussed that his act team was worried about him, he does not recall this.    Past Medical History:  Diagnosis Date  . Schizophrenia [facility])     Patient Active Problem List   Diagnosis Date Noted  . Tobacco use disorder 03/26/2018  . 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 Schizophrenia ([place]) 01/27/2017  . Hyponatremia 01/27/2017  . Polydipsia 01/27/2017    No past surgical history on file.  Prior to Admission medications   Medication Sig Start Date End Date Taking? Authorizing Provider  benztropine (COGENTIN) 2 MG tablet Take 1 tablet (2 mg total) by mouth daily. 04/02/18  Yes Pucilowska, Jolanta B, MD  diphenhydrAMINE (BENADRYL) 50 MG capsule Take 1 capsule (50 mg total) by mouth at bedtime. 04/02/18  Yes Pucilowska, Jolanta B, MD  haloperidol decanoate (HALDOL DECANOATE) 100 MG/ML injection Inject 1.5 mLs (150 mg total) into the muscle every 28 (twenty-eight) days. Patient not taking: Reported on 05/07/2019 04/27/18   Clovis Fredrickson, MD    Allergies Patient has no known  allergies.  No family history on file.  Social History Social History   Tobacco Use  . Smoking status: Current Every Day Smoker    Packs/day: 0.25    Years: 15.00    Pack years: 3.75    Types: Cigarettes  . Smokeless tobacco: Never Used  . Tobacco comment: Cannot state the amount he smokes per day  Substance Use Topics  . Alcohol use: Not on file  . Drug use: No    Review of Systems  Review of Systems  Unable to perform ROS: Psychiatric disorder     ____________________________________________  PHYSICAL EXAM:      VITAL SIGNS: ED Triage Vitals  Enc Vitals Group     BP 05/07/19 1915 (!) 147/78     Pulse Rate 05/07/19 1915 89     Resp 05/07/19 1915 16     Temp 05/07/19 1915 98.7 F (37.1 C)     Temp Source 05/07/19 1915 Oral     SpO2 05/07/19 1915 99 %     Weight 05/07/19 1916 185 lb (83.9 kg)     Height 05/07/19 1916 5\' 8"  (1.727 m)     Head Circumference --      Peak Flow --      Pain Score 05/07/19 1915 0     Pain Loc --      Pain Edu? --      Excl. in [place]? --      Physical Exam Vitals and nursing note reviewed.  Constitutional:  General: He is not in acute distress.    Appearance: He is well-developed.  HENT:     Head: Normocephalic and atraumatic.  Eyes:     Conjunctiva/sclera: Conjunctivae normal.  Cardiovascular:     Rate and Rhythm: Normal rate and regular rhythm.     Heart sounds: Normal heart sounds.  Pulmonary:     Effort: Pulmonary effort is normal. No respiratory distress.     Breath sounds: No wheezing.  Abdominal:     General: There is no distension.  Musculoskeletal:     Cervical back: Neck supple.  Skin:    General: Skin is warm.     Capillary Refill: Capillary refill takes less than 2 seconds.     Findings: No rash.  Neurological:     Mental Status: He is alert and oriented to person, place, and time.     Motor: No abnormal muscle tone.  Psychiatric:     Comments: Responding to internal stimuli. Flat affect, blunt.         ____________________________________________   LABS (all labs ordered are listed, but only abnormal results are displayed)  Labs Reviewed  COMPREHENSIVE METABOLIC PANEL - Abnormal; Notable for the following components:      Result Value   Sodium 128 (*)    Chloride 96 (*)    Calcium 8.1 (*)    All other components within normal limits  SALICYLATE LEVEL - Abnormal; Notable for the following components:   Salicylate Lvl <7.0 (*)    All other components within normal limits  ACETAMINOPHEN LEVEL - Abnormal; Notable for the following components:   Acetaminophen (Tylenol), Serum <10 (*)    All other components within normal limits  CBC - Abnormal; Notable for the following components:   RBC 4.11 (*)    Hemoglobin 12.8 (*)    HCT 36.7 (*)    All other components within normal limits  RESPIRATORY PANEL BY RT PCR (FLU A&B, COVID)  ETHANOL  URINE DRUG SCREEN, QUALITATIVE ([facility] ONLY)    ____________________________________________  EKG: none ________________________________________  RADIOLOGY All imaging, including plain films, CT scans, and ultrasounds, independently reviewed by me, and interpretations confirmed via formal radiology reads.  ED MD interpretation:   None  Official radiology report(s): No results found.  ____________________________________________  PROCEDURES   Procedure(s) performed (including Critical Care):  Procedures  ____________________________________________  INITIAL IMPRESSION / MDM / ASSESSMENT AND PLAN / ED COURSE  As part of my medical decision making, I reviewed the following data within the electronic MEDICAL RECORD NUMBER Nursing notes reviewed and incorporated, Old chart reviewed, Notes from prior ED visits, and [place] Controlled Substance Database       *LAKER THOMPSON was evaluated in Emergency Department on 05/08/2019 for the symptoms described in the history of present illness. He was evaluated in the context of the global COVID-19  pandemic, which necessitated consideration that the patient might be at risk for infection with the SARS-CoV-2 virus that causes COVID-19. Institutional protocols and algorithms that pertain to the evaluation of patients at risk for COVID-19 are in a state of rapid change based on information released by regulatory bodies including the CDC and federal and state organizations. These policies and algorithms were followed during the patient's care in the ED.  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 Pt has been off  medications and is actively hallucinating, responding to internal stimuli. 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 He has no tremors, headache,seizures, or other sx of hyponatremia and I do not feel this is contributing to his current mental status. That being said, will restrict fluids and repeat BMP in morning.  The patient has been placed in psychiatric observation due to the need to provide a safe environment for the patient while obtaining psychiatric consultation and evaluation, as well as ongoing medical and medication management to treat the patient's condition.  The patient has been placed under full IVC at this time.   ____________________________________________  FINAL CLINICAL IMPRESSION(S) / ED DIAGNOSES  Final diagnoses:  Paranoid schizophrenia (HCC)  Psychogenic polydipsia     MEDICATIONS GIVEN DURING THIS VISIT:  Medications  sodium chloride 0.9 % bolus 1,000 mL (1,000 mLs Intravenous Refused 05/07/19 2244)     ED Discharge Orders    None       Note:  This document was prepared using Dragon voice recognition software and may include unintentional dictation errors.   Shaune Pollack, MD 05/08/19 [phone]

## 2019-05-07 NOTE — BH Assessment (Signed)
Assessment Note  Shane Nash is an 39 y.o. male. TTS attempted to conduct the assessment. Shane Nash would not engage with the assessor.  TTS attempted to contact the group home. No one answered.  TTS contacted [facility] at [phone] from Psychotherapeutic services.  The after hours contact information for [facility] is [phone].  TTS spoke with Jackie Plum. She reports that Shane Nash has not been taking his oral medications. He takes his shot with no problem though.  He is difficult to get compliance.  There has been an increase in his agitation.  When psychotic, he drinks water nonstop which can be bad for him."  IVC paperwork reports, "Respondent suffers from schizophrenia and is not taking oral medication as prescribed.  Respondent is urinating in bottles and defecating in bags, keeping said items in his room.  Respondent is delusional and hallucinating. Respondent has disorganized thinking"  Diagnosis: Schizophrenia  Past Medical History:  Past Medical History:  Diagnosis Date  . Schizophrenia (HCC)     No past surgical history on file.  Family History: No family history on file.  Social History:  reports that he has been smoking cigarettes. He has a 3.75 pack-year smoking history. He has never used smokeless tobacco. He reports that he does not use drugs. No history on file for alcohol.  Additional Social History:     CIWA: CIWA-Ar BP: (!) 147/78 Pulse Rate: 89 COWS:    Allergies: No Known Allergies  Home Medications: (Not in a hospital admission)   OB/GYN Status:  No LMP for male patient.  General Assessment Data Assessment unable to be completed: Yes Reason for not completing assessment: Patient would not participate with the assessor Admission Status: Involuntary                    Mental Status Report Motor Activity: Freedom of movement                            Advance Directives (For Healthcare) Does Patient Have a  Medical Advance Directive?: Unable to assess, patient is non-responsive or altered mental status          Disposition:     On Site Evaluation by:   Reviewed with Physician:    Justice Deeds 05/07/2019 11:07 PM

## 2019-05-07 NOTE — ED Notes (Signed)
Patient changed into maroon scrubs with police officer present. Patient has one bag of belongings:  Two sneakers, one with missing shoelace Jeans, with shoelace belt, nothing in the pockets [employer]     ED tech [place] present.

## 2019-05-07 NOTE — ED Notes (Signed)
Pt. Given drinks with sodium and saltine crackers.

## 2019-05-08 DIAGNOSIS — F203 Undifferentiated schizophrenia: Secondary | ICD-10-CM

## 2019-05-08 DIAGNOSIS — F200 Paranoid schizophrenia: Secondary | ICD-10-CM

## 2019-05-08 LAB — BASIC METABOLIC PANEL
Anion gap: 3 — ABNORMAL LOW (ref 5–15)
BUN: 8 mg/dL (ref 6–20)
CO2: 28 mmol/L (ref 22–32)
Calcium: 8.8 mg/dL — ABNORMAL LOW (ref 8.9–10.3)
Chloride: 107 mmol/L (ref 98–111)
Creatinine, Ser: 0.91 mg/dL (ref 0.61–1.24)
GFR calc Af Amer: >60 mL/min (ref >60)
GFR calc non Af Amer: >60 mL/min (ref >60)
Glucose, Bld: 104 mg/dL — ABNORMAL HIGH (ref 70–99)
Potassium: 3.9 mmol/L (ref 3.5–5.1)
Sodium: 138 mmol/L (ref 135–145)

## 2019-05-08 LAB — RESPIRATORY PANEL BY RT PCR (FLU A&B, COVID)
Influenza A by PCR: NEGATIVE
Influenza B by PCR: NEGATIVE
SARS Coronavirus 2 by RT PCR: NEGATIVE

## 2019-05-08 NOTE — ED Notes (Signed)
He has ambulated to the BR and verbalizes to me that he urinated while he was in the BR   frequent reminders that the bathroom is open and available for him to use anytime

## 2019-05-08 NOTE — BHH Counselor (Addendum)
Referral information for Psychiatric Hospitalization faxed to;   Marland Kitchen [facility] [place] [phone])- Carlie reports plans to relay information to only clinician working and call back    [facility] [phone])- Hilda Lias reported no beds available 4/10  . [facility] ([phone]--[phone])-  No answer, unable to leave a voicemail  . [facility] [phone] or [phone])-  Herbert Seta reports at capacity, call back tomorrow  . [facility] [phone])- unable to reach anyone   . [name] [phone] -or- [phone])- Clydie Braun reports will review and call back   . [facility] [phone])- No answer, left a voicemail  . [facility] [phone])- Unable to reach anyone

## 2019-05-08 NOTE — ED Notes (Addendum)
He has ambulated to and from the BR  - he verbalizes to me that he urinated   With redirection he has not urinated in his room since this am note

## 2019-05-08 NOTE — ED Notes (Signed)
He is lying in bed - watching TV   NAD observed

## 2019-05-08 NOTE — ED Notes (Signed)
Entered his room - 2 juice cups and one styrofoam cup was full of a yellow liquid - I asked him if he had urinated and he stated  "yes"   Encouraged him to go to the BR when he needs to go    No empty juice containers or cups left in his room

## 2019-05-08 NOTE — Consult Note (Signed)
[facility] Face-to-Face Psychiatry Consult   Reason for Consult:  Psych evaluation  Referring Physician:  Dr. Erma Heritage Patient Identification: Shane Nash MRN:  [id number] Principal Diagnosis: <principal problem not specified> Diagnosis:  Active Problems:   Schizophrenia (HCC)   Total Time spent with patient: 45 minutes  Subjective:   Shane Nash is a 39 y.o. male patient admitted with bizarre behavior.  HPI:  Per TTS: Shane Nash is an 39 y.o. male. TTS attempted to conduct the assessment. Mr. Ambrosio would not engage with the assessor.  TTS attempted to contact the group home. No one answered.  TTS contacted [facility] at [phone] from Psychotherapeutic services.  The after hours contact information for [facility] is [phone].  TTS spoke with Jackie Plum. She reports that Shane Nash has not been taking his oral medications. He takes his shot with no problem though.  He is difficult to get compliance.  There has been an increase in his agitation.  When psychotic, he drinks water nonstop which can be bad for him."  IVC paperwork reports, "Respondent suffers from schizophrenia and is not taking oral medication as prescribed.  Respondent is urinating in bottles and defecating in bags, keeping said items in his room.  Respondent is delusional and hallucinating. Respondent has disorganized thinking"  Patient was sleep when writer approaches. Patient awakens after several calls.  When asked what brings him in , patient yells as though he is also yawning. Patient is mentally unable to provide an assessment.  Past Psychiatric History: yes   Risk to Self:   Risk to Others:   Prior Inpatient Therapy:   Prior Outpatient Therapy:    Past Medical History:  Past Medical History:  Diagnosis Date  . Schizophrenia (HCC)    No past surgical history on file. Family History: No family history on file. Family Psychiatric  History: unknown Social History:  Social History   Substance  and Sexual Activity  Alcohol Use None     Social History   Substance and Sexual Activity  Drug Use No    Social History   Socioeconomic History  . Marital status: Single    Spouse name: Not on file  . Number of children: Not on file  . Years of education: Not on file  . Highest education level: Not on file  Occupational History  . Not on file  Tobacco Use  . Smoking status: Current Every Day Smoker    Packs/day: 0.25    Years: 15.00    Pack years: 3.75    Types: Cigarettes  . Smokeless tobacco: Never Used  . Tobacco comment: Cannot state the amount he smokes per day  Substance and Sexual Activity  . Alcohol use: Not on file  . Drug use: No  . Sexual activity: Not Currently  Other Topics Concern  . Not on file  Social History Narrative  . Not on file   Social Determinants of Health   Financial Resource Strain:   . Difficulty of Paying Living Expenses:   Food Insecurity:   . Worried About Programme researcher, broadcasting/film/video in the Last Year:   . Barista in the Last Year:   Transportation Needs:   . Freight forwarder (Medical):   Marland Kitchen Lack of Transportation (Non-Medical):   Physical Activity:   . Days of Exercise per Week:   . Minutes of Exercise per Session:   Stress:   . Feeling of Stress :   Social Connections:   . Frequency of  Communication with Friends and Family:   . Frequency of Social Gatherings with Friends and Family:   . Attends Religious Services:   . Active Member of Clubs or Organizations:   . Attends Archivist Meetings:   Marland Kitchen Marital Status:    Additional Social History:    Allergies:  No Known Allergies  Labs:  Results for orders placed or performed during the hospital encounter of 05/07/19 (from the past 48 hour(s))  Comprehensive metabolic panel     Status: Abnormal   Collection Time: 05/07/19  7:08 PM  Result Value Ref Range   Sodium 128 (L) 135 - 145 mmol/L   Potassium 3.5 3.5 - 5.1 mmol/L    Comment: HEMOLYSIS AT THIS LEVEL  MAY AFFECT RESULT   Chloride 96 (L) 98 - 111 mmol/L   CO2 24 22 - 32 mmol/L   Glucose, Bld 87 70 - 99 mg/dL    Comment: Glucose reference range applies only to samples taken after fasting for at least 8 hours.   BUN 7 6 - 20 mg/dL   Creatinine, Ser 0.82 0.61 - 1.24 mg/dL   Calcium 8.1 (L) 8.9 - 10.3 mg/dL   Total Protein 7.3 6.5 - 8.1 g/dL   Albumin 3.8 3.5 - 5.0 g/dL   AST 25 15 - 41 U/L   ALT 16 0 - 44 U/L   Alkaline Phosphatase 76 38 - 126 U/L   Total Bilirubin 0.9 0.3 - 1.2 mg/dL   GFR calc non Af Amer >60 >60 mL/min   GFR calc Af Amer >60 >60 mL/min   Anion gap 8 5 - 15    Comment: Performed at [facility], [address], [place], [place] [address]  Ethanol     Status: None   Collection Time: 05/07/19  7:08 PM  Result Value Ref Range   Alcohol, Ethyl (B) <10 <10 mg/dL    Comment: (NOTE) Lowest detectable limit for serum alcohol is 10 mg/dL. For medical purposes only. Performed at [facility], [address] [place], [place] [address]   Salicylate level     Status: Abnormal   Collection Time: 05/07/19  7:08 PM  Result Value Ref Range   Salicylate Lvl <2.6 (L) 7.0 - 30.0 mg/dL    Comment: Performed at [facility], [address] [place], [place] [address]  Acetaminophen level     Status: Abnormal   Collection Time: 05/07/19  7:08 PM  Result Value Ref Range   Acetaminophen (Tylenol), Serum <10 (L) 10 - 30 ug/mL    Comment: (NOTE) Therapeutic concentrations vary significantly. A range of 10-30 ug/mL  may be an effective concentration for many patients. However, some  are best treated at concentrations outside of this range. Acetaminophen concentrations >150 ug/mL at 4 hours after ingestion  and >50 ug/mL at 12 hours after ingestion are often associated with  toxic reactions. Performed at [facility], [address] [place], [place] [address]   cbc     Status: Abnormal   Collection Time: 05/07/19  7:08 PM  Result Value  Ref Range   WBC 8.3 4.0 - 10.5 K/uL   RBC 4.11 (L) 4.22 - 5.81 MIL/uL   Hemoglobin 12.8 (L) 13.0 - 17.0 g/dL   HCT 36.7 (L) 39.0 - 52.0 %   MCV 89.3 80.0 - 100.0 fL   MCH 31.1 26.0 - 34.0 pg   MCHC 34.9 30.0 - 36.0 g/dL   RDW 12.4 11.5 - 15.5 %  Platelets 302 150 - 400 K/uL   nRBC 0.0 0.0 - 0.2 %    Comment: Performed at [facility], [address] [place], [place] [address]    Current Facility-Administered Medications  Medication Dose Route Frequency Provider Last Rate Last Admin  . sodium chloride 0.9 % bolus 1,000 mL  1,000 mL Intravenous Once Shaune Pollack, MD       Current Outpatient Medications  Medication Sig Dispense Refill  . benztropine (COGENTIN) 2 MG tablet Take 1 tablet (2 mg total) by mouth daily. 30 tablet 1  . diphenhydrAMINE (BENADRYL) 50 MG capsule Take 1 capsule (50 mg total) by mouth at bedtime. 30 capsule 1  . haloperidol decanoate (HALDOL DECANOATE) 100 MG/ML injection Inject 1.5 mLs (150 mg total) into the muscle every 28 (twenty-eight) days. (Patient not taking: Reported on 05/07/2019) 1 mL 1    Musculoskeletal: Strength & Muscle Tone: within normal limits Gait & Station: unsteady Patient leans: N/A  Psychiatric Specialty Exam: Physical Exam  Review of Systems  Blood pressure (!) 147/78, pulse 89, temperature 98.7 F (37.1 C), temperature source Oral, resp. rate 16, height 5\' 8"  (1.727 m), weight 83.9 kg, SpO2 99 %.Body mass index is 28.13 kg/m.  General Appearance: Bizarre  Eye Contact:  Minimal  Speech:  Blocked  Volume:  Increased  Mood:  Dysphoric  Affect:  Inappropriate  Thought Process:  Disorganized and Descriptions of Associations: Loose  Orientation:  NA  Thought Content:  Illogical  Suicidal Thoughts:  No  Homicidal Thoughts:  No  Memory:  Recent;   Poor  Judgement:  Impaired  Insight:  Lacking  Psychomotor Activity:  Decreased  Concentration:  Concentration: Poor  Recall:  Fair  Fund of Knowledge:  Poor  Language:   Fair  Akathisia:  NA  Handed:  Right  AIMS (if indicated):     Assets:  Others:  na  ADL's:  Impaired  Cognition:  Impaired,  Severe  Sleep:        Treatment Plan Summary: Daily contact with patient to assess and evaluate symptoms and progress in treatment and Medication management  Disposition: Recommend psychiatric Inpatient admission when medically cleared. Supportive therapy provided about ongoing stressors.  , NP 05/08/2019 1:10 AM

## 2019-05-08 NOTE — ED Notes (Signed)
Pt observed standing in corner of room urinating in his meal tray from earlier. Meal tray discarded into trash can and pt is advised to use the restroom when he needs to, pt directed to restroom at this time where he states that he "don't have to go"

## 2019-05-08 NOTE — ED Notes (Signed)
Clean undergarment, pants and socks provided

## 2019-05-08 NOTE — ED Notes (Signed)
Pt transferred into ED BHU room 2    Patient assigned to appropriate care area. Patient oriented to unit/care area: Informed that, for his safety, care areas are designed for safety and monitored by security cameras at all times; Visiting hours and phone times explained to patient. Patient verbalizes understanding, and verbal contract for safety obtained.  Assessment completed  He denies pain     

## 2019-05-09 LAB — URINE DRUG SCREEN, QUALITATIVE (ARMC ONLY)
Amphetamines, Ur Screen: NOT DETECTED
Barbiturates, Ur Screen: NOT DETECTED
Benzodiazepine, Ur Scrn: NOT DETECTED
Cannabinoid 50 Ng, Ur ~~LOC~~: NOT DETECTED
Cocaine Metabolite,Ur ~~LOC~~: NOT DETECTED
MDMA (Ecstasy)Ur Screen: NOT DETECTED
Methadone Scn, Ur: NOT DETECTED
Opiate, Ur Screen: NOT DETECTED
Phencyclidine (PCP) Ur S: NOT DETECTED
Tricyclic, Ur Screen: NOT DETECTED

## 2019-05-09 MED ORDER — HALOPERIDOL 5 MG PO TABS
5.0000 mg | ORAL_TABLET | Freq: Two times a day (BID) | ORAL | Status: DC
  Administered 2019-05-09 – 2019-05-10 (×3): 5 mg via ORAL
  Filled 2019-05-09 (×4): qty 1

## 2019-05-09 MED ORDER — BENZTROPINE MESYLATE 1 MG PO TABS
1.0000 mg | ORAL_TABLET | Freq: Every day | ORAL | Status: DC
  Administered 2019-05-10 – 2019-05-12 (×3): 1 mg via ORAL
  Filled 2019-05-09 (×4): qty 1

## 2019-05-09 NOTE — ED Provider Notes (Signed)
Emergency Medicine Observation Re-evaluation Note  Shane Nash is a 39 y.o. male, seen on rounds today.  Pt initially presented to the ED for complaints of Altered Mental Status Currently, the patient is resting in no acute distress.  Physical Exam  BP (!) 130/94 (BP Location: Right Arm)   Pulse 79   Temp 98.1 F (36.7 C) (Oral)   Resp 18   Ht 5\' 8"  (1.727 m)   Wt 83.9 kg   SpO2 98%   BMI 28.13 kg/m  Physical Exam  ED Course / MDM  EKG:    I have reviewed the labs performed to date as well as medications administered while in observation.  No events overnight.   Plan  Current plan is for psychiatric disposition. Patient is under full IVC at this time.   , MD 05/09/19 [phone]

## 2019-05-09 NOTE — ED Notes (Signed)
Attempted to educate him about his medications that have been ordered  He states  "Naw - I don't need any medicine - maybe later - in 30 minutes"    I will hold and try again

## 2019-05-09 NOTE — ED Notes (Signed)
He is lying in bed  - head partially covered by his blanket  Assessment completed  He denies pain   He is IVC pending inpatient placement   Shower items offered  He states  "maybe in 30 minutes"  All cups/juice containers removed from his room  - reminded him he can use the BR anytime he feels the urge to go

## 2019-05-09 NOTE — ED Notes (Signed)
Attempted to administered meds as ordered  - pt states  "No - I am good - not today - maybe tomorrow"

## 2019-05-10 NOTE — ED Notes (Signed)
IVC/ Consult completed/ Pending Placement vs Admit

## 2019-05-10 NOTE — ED Provider Notes (Signed)
Emergency Medicine Observation Re-evaluation Note  Shane Nash is a 39 y.o. male, seen on rounds today.  Pt initially presented to the ED for complaints of Altered Mental Status Currently, the patient is sleeping.  Physical Exam  BP 119/62 (BP Location: Left Arm)   Pulse (!) 105   Temp 98.6 F (37 C) (Oral)   Resp 18   Ht 5\' 8"  (1.727 m)   Wt 83.9 kg   SpO2 100%   BMI 28.13 kg/m  Physical Exam  ED Course / MDM  EKG:    I have reviewed the labs performed to date as well as medications administered while in observation.  Recent changes in the last 24 hours include none. Plan  Current plan is for psychiatric disposition. Patient is under full IVC at this time.   , Don Perking, MD 05/10/19 [phone]

## 2019-05-11 NOTE — ED Notes (Signed)
Pt. Alert and oriented, warm and dry, in no distress. Pt. Denies SI, HI, and AVH. Pt. Encouraged to let nursing staff know of any concerns or needs. 

## 2019-05-11 NOTE — ED Notes (Signed)

## 2019-05-11 NOTE — ED Provider Notes (Signed)
Emergency Medicine Observation Re-evaluation Note  Shane Nash is a 39 y.o. male, seen on rounds today.  Pt initially presented to the ED for complaints of Altered Mental Status Currently, the patient is resting in no acute distress.  Physical Exam  BP 108/72 (BP Location: Left Arm)   Pulse 66   Temp 98.8 F (37.1 C) (Oral)   Resp 18   Ht 5\' 8"  (1.727 m)   Wt 83.9 kg   SpO2 99%   BMI 28.13 kg/m  Physical Exam  ED Course / MDM  EKG:    I have reviewed the labs performed to date as well as medications administered while in observation.  No events overnight.   Plan  Current plan is for psychiatric disposition. Patient is under full IVC at this time.   , MD 05/11/19 [phone]

## 2019-05-11 NOTE — ED Notes (Signed)
IVC/  PENDING  PLACEMENT 

## 2019-05-11 NOTE — BH Assessment (Signed)
Referral check:   [facility] ([phone]- [phone])- Left a voicemail   [facility] [phone])- Left a voicemail for [facility] ([phone]--[phone])-  Left a voicemail   [place] [phone] or [phone])-  Left a voicemail    [place] [phone])- Darl Pikes reported denied due to behavorial and ADL needs    [name] [phone] -or- [phone])- Tristen reports denied due acuity     [facility] [phone])- Left a voicemail    [facility] [phone])- Unable to reach anyone

## 2019-05-11 NOTE — Consult Note (Signed)
[facility] Face-to-Face Psychiatry Consult   Reason for Consult:  Psychosis  Referring Physician:  EDMD   Patient Identification: Shane Nash MRN:  [id number] Principal Diagnosis: Schizophrenia Diagnosis:  Active Problems:   Schizophrenia (HCC)   Total Time spent with patient: 15 minutes  Subjective:   Shane Nash is a 39 y.o. male patient admitted with florid psychosis. He currently appears sluggish and overmedicated. He has rhythmic movements of his tongue. He are are quite stiff although he does not complain of stiffness. No akathesia. No evidence of psychotic process but thought content is minimal so this is difficult to fully assess. No clear hallucinations.  HPI:  Shane L Johnsonis an 38 y.o.male.TTS attempted to conduct the assessment. Shane Nash would not engage with the assessor. TTS attempted to contact the group home. No one answered. TTS contacted Shane Nash at [phone] from [facility]otherapeutic services. The after hours contact information for [facility] is [phone]. TTS spoke with Shane Nash. She reports that Shane Nash has not been taking his oral medications. He takes his shot with no problem though. He is difficult to get compliance. There has been an increase in his agitation. When psychotic, he drinks water nonstop which can be bad for him."  IVC paperwork reports, "Respondent suffers from schizophrenia and is not taking oral medication as prescribed. Respondent is urinating in bottles and defecating in bags, keeping said items in his room. Respondent is delusional and hallucinating. Respondent has disorganized thinking"  Past Psychiatric History: Schizophrenia Risk to Self:   Risk to Others:   Prior Inpatient Therapy:   Prior Outpatient Therapy:    Past Medical History:  Past Medical History:  Diagnosis Date  . Schizophrenia (HCC)    No past surgical history on file. Family History: No family history on file. Family Psychiatric  History:   Social History:  Social History   Substance and Sexual Activity  Alcohol Use None     Social History   Substance and Sexual Activity  Drug Use No    Social History   Socioeconomic History  . Marital status: Single    Spouse name: Not on file  . Number of children: Not on file  . Years of education: Not on file  . Highest education level: Not on file  Occupational History  . Not on file  Tobacco Use  . Smoking status: Current Every Day Smoker    Packs/day: 0.25    Years: 15.00    Pack years: 3.75    Types: Cigarettes  . Smokeless tobacco: Never Used  . Tobacco comment: Cannot state the amount he smokes per day  Substance and Sexual Activity  . Alcohol use: Not on file  . Drug use: No  . Sexual activity: Not Currently  Other Topics Concern  . Not on file  Social History Narrative  . Not on file   Social Determinants of Health   Financial Resource Strain:   . Difficulty of Paying Living Expenses:   Food Insecurity:   . Worried About Programme researcher, broadcasting/film/video in the Last Year:   . Barista in the Last Year:   Transportation Needs:   . Freight forwarder (Medical):   Marland Kitchen Lack of Transportation (Non-Medical):   Physical Activity:   . Days of Exercise per Week:   . Minutes of Exercise per Session:   Stress:   . Feeling of Stress :   Social Connections:   . Frequency of Communication with Friends and Family:   .  Frequency of Social Gatherings with Friends and Family:   . Attends Religious Services:   . Active Member of Clubs or Organizations:   . Attends Archivist Meetings:   Marland Kitchen Marital Status:    Additional Social History:    Allergies:  No Known Allergies  Labs: No results found for this or any previous visit (from the past 48 hour(s)).  Current Facility-Administered Medications  Medication Dose Route Frequency Provider Last Rate Last Admin  . benztropine (COGENTIN) tablet 1 mg  1 mg Oral Daily Money, Darnelle Maffucci B, FNP   1 mg at 05/11/19 1050   . sodium chloride 0.9 % bolus 1,000 mL  1,000 mL Intravenous Once Duffy Bruce, MD       Current Outpatient Medications  Medication Sig Dispense Refill  . benztropine (COGENTIN) 2 MG tablet Take 1 tablet (2 mg total) by mouth daily. 30 tablet 1  . diphenhydrAMINE (BENADRYL) 50 MG capsule Take 1 capsule (50 mg total) by mouth at bedtime. 30 capsule 1  . haloperidol decanoate (HALDOL DECANOATE) 100 MG/ML injection Inject 1.5 mLs (150 mg total) into the muscle every 28 (twenty-eight) days. (Patient not taking: Reported on 05/07/2019) 1 mL 1    Musculoskeletal: Strength & Muscle Tone: increased Gait & Station: Not assessed Patient leans: N/A  Psychiatric Specialty Exam: Physical Exam  Review of Systems  Blood pressure 101/61, pulse 73, temperature 98.8 F (37.1 C), temperature source Oral, resp. rate 18, height 5\' 8"  (1.727 m), weight 83.9 kg, SpO2 97 %.Body mass index is 28.13 kg/m.  General Appearance: Disheveled  Eye Contact:  Poor  Speech:  Slurred  Volume:  Decreased  Mood:  limited  Affect:  Constricted  Thought Process:  Limited due to minimal conversation  Orientation: generally oriented but mostly non-responsive to questions  Thought Content:  Logical  Suicidal Thoughts:  No  Homicidal Thoughts:  No  Memory:  NA  Judgement:  Fair  Insight:  Lacking  Psychomotor Activity:  Decreased  Concentration:  Concentration: Fair  Recall:  NA  Fund of Knowledge:  Poor  Language:  Fair  Akathisia:  No  Handed:  Ambidextrous  AIMS (if indicated):     Assets:  Others:  None identified  ADL's:  Impaired  Cognition:  Impaired,  Mild  Sleep:        Treatment Plan Summary: Daily contact with patient to assess and evaluate symptoms and progress in treatment and Medication management   Pt was received 5mg  haloperidol BID. I discontinued this but continued benztropine. Will continue to follow. He has 100mg  IM haloperidol deconoate 2 weeks ago so he should have good coverage.  Admission was likely to do another stress or substance use and not due to insufficient anti-psychotic.  Disposition: Recommend psychiatric Inpatient admission when medically cleared.  Alesia Morin, MD 05/11/2019 2:21 PM

## 2019-05-12 NOTE — ED Provider Notes (Signed)
Emergency Medicine Observation Re-evaluation Note  Shane Nash is a 39 y.o. male, seen on rounds today.  Pt initially presented to the ED for complaints of Altered Mental Status Currently, the patient isin no acute distress  Physical Exam  BP 121/77 (BP Location: Left Arm)   Pulse 77   Temp 99.1 F (37.3 C) (Oral)   Resp 16   Ht 5\' 8"  (1.727 m)   Wt 83.9 kg   SpO2 97%   BMI 28.13 kg/m  Physical Exam  ED Course / MDM  EKG:    I have reviewed the labs performed to date as well as medications administered while in observation.   Plan  Current plan is for placement Patient is under full IVC at this time.   , MD 05/12/19 [phone]

## 2019-05-12 NOTE — ED Notes (Signed)
Hourly rounding reveals patient sleeping in room. No complaints, stable, in no acute distress. Q15 minute rounds and monitoring via Security Cameras to continue. 

## 2019-05-12 NOTE — BH Assessment (Signed)
TTS & Psych reassessment completed. Pt presented oriented x 3. Pt reports feeling better than he has in a few days. Pt denied any current SI/HI, delusions or hallucinations. Pt speech and thought process is organized. Pt confirmed his safety.   Per Cindy Hazy, MD Pt will be discharged to ACT team.

## 2019-05-12 NOTE — Consult Note (Signed)
[facility] Face-to-Face Psychiatry Consult   Reason for Consult:  Psychosis  Referring Physician:  EDMD   Patient Identification: Shane Nash MRN:  [id number] Principal Diagnosis: Schizophrenia Diagnosis:  Active Problems:   Schizophrenia ([place])   Total Time spent with patient: 15 minutes  Subjective:   Shane Nash is a 39 y.o. male patient admitted with florid psychosis.   Yesterday he appeared sluggish and overmedicated. He had rhythmic movements of his tongue. He was quite stiff although he did not complain of stiffness. No akathesia.   These have resolved nicely with stopping of 5mg  haloperidol BID.  There was is evidence of psychotic process but thought content is minimal so this is difficult to fully assess. No clear hallucinations.  HPI:  Shane Nash.TTS attempted to conduct the assessment. Shane Nash would not engage with the assessor. TTS attempted to contact the group home. No one answered. TTS contacted [facility] at [phone] from Psychotherapeutic services. The after hours contact information for [facility] is [phone]. TTS spoke with Patrice Paradise. She reports that Shane Nash has not been taking his oral medications. He takes his shot with no problem though. He is difficult to get compliance. There has been an increase in his agitation. When psychotic, he drinks water nonstop which can be bad for him."  IVC paperwork reports, "Respondent suffers from schizophrenia and is not taking oral medication as prescribed. Respondent is urinating in bottles and defecating in bags, keeping said items in his room. Respondent is delusional and hallucinating. Respondent has disorganized thinking"  Past Psychiatric History: Schizophrenia Risk to Self:   Risk to Others:   Prior Inpatient Therapy:   Prior Outpatient Therapy:    Past Medical History:  Past Medical History:  Diagnosis Date  . Schizophrenia ([place])    No past surgical history on  file. Family History: No family history on file. Family Psychiatric  History:  Social History:  Social History   Substance and Sexual Activity  Alcohol Use None     Social History   Substance and Sexual Activity  Drug Use No    Social History   Socioeconomic History  . Marital status: Single    Spouse name: Not on file  . Number of children: Not on file  . Years of education: Not on file  . Highest education level: Not on file  Occupational History  . Not on file  Tobacco Use  . Smoking status: Current Every Day Smoker    Packs/day: 0.25    Years: 15.00    Pack years: 3.75    Types: Cigarettes  . Smokeless tobacco: Never Used  . Tobacco comment: Cannot state the amount he smokes per day  Substance and Sexual Activity  . Alcohol use: Not on file  . Drug use: No  . Sexual activity: Not Currently  Other Topics Concern  . Not on file  Social History Narrative  . Not on file   Social Determinants of Health   Financial Resource Strain:   . Difficulty of Paying Living Expenses:   Food Insecurity:   . Worried About Charity fundraiser in the Last Year:   . Arboriculturist in the Last Year:   Transportation Needs:   . Film/video editor (Medical):   Marland Kitchen Lack of Transportation (Non-Medical):   Physical Activity:   . Days of Exercise per Week:   . Minutes of Exercise per Session:   Stress:   . Feeling of Stress :  Social Connections:   . Frequency of Communication with Friends and Family:   . Frequency of Social Gatherings with Friends and Family:   . Attends Religious Services:   . Active Member of Clubs or Organizations:   . Attends Banker Meetings:   Marland Kitchen Marital Status:    Additional Social History:    Allergies:  No Known Allergies  Labs: No results found for this or any previous visit (from the past 48 hour(s)).  Current Facility-Administered Medications  Medication Dose Route Frequency Provider Last Rate Last Admin  . benztropine  (COGENTIN) tablet 1 mg  1 mg Oral Daily Money, Gerlene Burdock, FNP   1 mg at 05/12/19 1058  . sodium chloride 0.9 % bolus 1,000 mL  1,000 mL Intravenous Once Shaune Pollack, MD       Current Outpatient Medications  Medication Sig Dispense Refill  . benztropine (COGENTIN) 2 MG tablet Take 1 tablet (2 mg total) by mouth daily. 30 tablet 1  . diphenhydrAMINE (BENADRYL) 50 MG capsule Take 1 capsule (50 mg total) by mouth at bedtime. 30 capsule 1  . haloperidol decanoate (HALDOL DECANOATE) 100 MG/ML injection Inject 1.5 mLs (150 mg total) into the muscle every 28 (twenty-eight) days. (Patient not taking: Reported on 05/07/2019) 1 mL 1     Blood pressure (!) 143/86, pulse 84, temperature 98.2 F (36.8 C), temperature source Oral, resp. rate 18, height 5\' 8"  (1.727 m), weight 83.9 kg, SpO2 96 %.Body mass index is 28.13 kg/m.  General Appearance: Disheveled  Eye Contact:  Poor  Speech:  Slurred  Volume:  Decreased  Mood:  limited  Affect:  Constricted  Thought Process:  Limited due to minimal conversation  Orientation: generally oriented but mostly non-responsive to questions  Thought Content:  Logical  Suicidal Thoughts:  No  Homicidal Thoughts:  No  Memory:  NA  Judgement:  Fair  Insight:  Lacking  Psychomotor Activity:  Decreased  Concentration:  Concentration: Fair  Recall:  NA  Fund of Knowledge:  Poor  Language:  Fair  Akathisia:  No  Handed:  Ambidextrous  AIMS (if indicated):     Assets:  Others:  None identified  ADL's:  Impaired  Cognition:  Impaired,  Mild  Sleep:        Treatment Plan Summary: Lethality issue prior to admission has resolved. Rescinded IVC  Pt was received 5mg  haloperidol BID. I discontinued this but continued benztropine.   He has 100mg  IM haloperidol deconoate 2 weeks ago so he should have good coverage. Admission was likely to do another stress or substance use and not due to insufficient anti-psychotic.  Disposition: He has excellent support from  the ACT team and thus discharge to the community is the least restrictive environment for his continued treatment.  , MD 05/12/2019 4:47 PM

## 2019-05-12 NOTE — ED Notes (Signed)
Patient received a breakfast tray placed on his bed

## 2019-05-12 NOTE — ED Notes (Signed)
Patient discharged home, patient received discharge papers. Patient received belongings and verbalized he has received all of his belongings. Patient appropriate and cooperative, Denies SI/HI AVH. Vital signs taken. NAD noted. 

## 2019-05-15 ENCOUNTER — Emergency Department
Admission: EM | Admit: 2019-05-15 | Discharge: 2019-05-17 | Disposition: A | Discharge status: Other Acute Inpatient Hospital | Payer: Medicaid Other | Attending: Emergency Medicine | Admitting: Emergency Medicine

## 2019-05-15 ENCOUNTER — Other Ambulatory Visit: Payer: Self-pay

## 2019-05-15 DIAGNOSIS — F209 Schizophrenia, unspecified: Secondary | ICD-10-CM | POA: Diagnosis not present

## 2019-05-15 DIAGNOSIS — F17210 Nicotine dependence, cigarettes, uncomplicated: Secondary | ICD-10-CM | POA: Diagnosis not present

## 2019-05-15 DIAGNOSIS — Z79899 Other long term (current) drug therapy: Secondary | ICD-10-CM | POA: Diagnosis not present

## 2019-05-15 DIAGNOSIS — R45851 Suicidal ideations: Secondary | ICD-10-CM | POA: Insufficient documentation

## 2019-05-15 DIAGNOSIS — Z20822 Contact with and (suspected) exposure to covid-19: Secondary | ICD-10-CM | POA: Diagnosis not present

## 2019-05-15 DIAGNOSIS — F201 Disorganized schizophrenia: Secondary | ICD-10-CM | POA: Diagnosis present

## 2019-05-15 DIAGNOSIS — R443 Hallucinations, unspecified: Secondary | ICD-10-CM | POA: Diagnosis present

## 2019-05-15 LAB — CBC WITH DIFFERENTIAL/PLATELET
Abs Immature Granulocytes: 0.02 10*3/uL (ref 0.00–0.07)
Basophils Absolute: 0 10*3/uL (ref 0.0–0.1)
Basophils Relative: 0 %
Eosinophils Absolute: 0.2 10*3/uL (ref 0.0–0.5)
Eosinophils Relative: 3 %
HCT: 44.1 % (ref 39.0–52.0)
Hemoglobin: 15 g/dL (ref 13.0–17.0)
Immature Granulocytes: 0 %
Lymphocytes Relative: 34 %
Lymphs Abs: 2.2 10*3/uL (ref 0.7–4.0)
MCH: 31.3 pg (ref 26.0–34.0)
MCHC: 34 g/dL (ref 30.0–36.0)
MCV: 92.1 fL (ref 80.0–100.0)
Monocytes Absolute: 0.8 10*3/uL (ref 0.1–1.0)
Monocytes Relative: 12 %
Neutro Abs: 3.3 10*3/uL (ref 1.7–7.7)
Neutrophils Relative %: 51 %
Platelets: 337 10*3/uL (ref 150–400)
RBC: 4.79 MIL/uL (ref 4.22–5.81)
RDW: 12.7 % (ref 11.5–15.5)
WBC: 6.6 10*3/uL (ref 4.0–10.5)
nRBC: 0 % (ref 0.0–0.2)

## 2019-05-15 LAB — COMPREHENSIVE METABOLIC PANEL
ALT: 20 U/L (ref 0–44)
AST: 20 U/L (ref 15–41)
Albumin: 4.3 g/dL (ref 3.5–5.0)
Alkaline Phosphatase: 88 U/L (ref 38–126)
Anion gap: 9 (ref 5–15)
BUN: 13 mg/dL (ref 6–20)
CO2: 25 mmol/L (ref 22–32)
Calcium: 9.2 mg/dL (ref 8.9–10.3)
Chloride: 104 mmol/L (ref 98–111)
Creatinine, Ser: 1.01 mg/dL (ref 0.61–1.24)
GFR calc Af Amer: >60 mL/min (ref >60)
GFR calc non Af Amer: >60 mL/min (ref >60)
Glucose, Bld: 85 mg/dL (ref 70–99)
Potassium: 3.8 mmol/L (ref 3.5–5.1)
Sodium: 138 mmol/L (ref 135–145)
Total Bilirubin: 0.9 mg/dL (ref 0.3–1.2)
Total Protein: 8.5 g/dL — ABNORMAL HIGH (ref 6.5–8.1)

## 2019-05-15 LAB — ETHANOL: Alcohol, Ethyl (B): <10 mg/dL (ref <10)

## 2019-05-15 NOTE — ED Triage Notes (Signed)
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 Patient is reportedly medication noncompliant.

## 2019-05-15 NOTE — BH Assessment (Addendum)
Assessment Note  Shane Nash is an 39 y.o. male presenting to [facility] ED under IVC given by his ACT team. 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 Patient is reportedly medication noncompliant. During assessment patient was alert and oriented x2 and had difficulty answering some of the questions, patient's affect was blunted and flat and appeared to be responding to stimuli as patient would mumble to himself and look at his hands, patient also presenting with some thought blocking. When asked why patient was presenting to the ED "I was at the house and I'm trying...." patient stopped in the middle of sentence and appeared to have some thought blocking then reported "I'm trying to get my own place." Patient was able to report he is still on the ACT team with [facility]. Patient reported that his appetite is "okay" but was unable to report how is sleep is going. Patient denied SI/HI/AH/VH when asked.  Collateral information was obtained from [facility] ACT team Delphina [phone] who is the on call staff this weekend that saw the patient tonight, she reported "we saw him Friday and we saw him using the restroom in bottles and not wanting to complete his ADLs, he was in the ED on Tuesday and after he was released we monitored him that day and the family reported that he had heightened symptoms, he wouldn't dress, he wouldn't function, he was talking to his hands and saying that someone is going to kill him or he will kill himself." "He won't take his oral meds, he has been taking his injection every month but not his oral medications, recently we realized he hadn't taken his oral meds for about 2 months."   Discussed patient with [facility] NP Lindon Romp who agrees to have patient be observed overnight and assessed in the morning. Patient to be seen by Psyc Provider in the morning 05/16/19  Diagnosis: Schizophrenia  Past Medical History:  Past  Medical History:  Diagnosis Date  . Schizophrenia ([place])     History reviewed. No pertinent surgical history.  Family History: No family history on file.  Social History:  reports that he has been smoking cigarettes. He has a 3.75 pack-year smoking history. He has never used smokeless tobacco. He reports that he does not use drugs. No history on file for alcohol.  Additional Social History:  Alcohol / Drug Use Pain Medications: See MAR Prescriptions: See MAR Over the Counter: See MAR History of alcohol / drug use?: No history of alcohol / drug abuse  CIWA: CIWA-Ar BP: 136/88 Pulse Rate: (!) 57 COWS:    Allergies: No Known Allergies  Home Medications: (Not in a hospital admission)   OB/GYN Status:  No LMP for male patient.  General Assessment Data Location of Assessment: [facility] ED TTS Assessment: In system Is this a Tele or Face-to-Face Assessment?: Face-to-Face Is this an Initial Assessment or a Re-assessment for this encounter?: Initial Assessment Patient Accompanied by:: N/A Language Other than English: No Living Arrangements: Other (Comment)(Private Residence with family) What gender do you identify as?: Male Marital status: Single Living Arrangements: Parent, Other relatives Can pt return to current living arrangement?: Yes Admission Status: Involuntary Petitioner: Other([facility] ACTT team) Is patient capable of signing voluntary admission?: No Referral Source: Other Insurance type: Medicaid  Medical Screening Exam ([place]) Medical Exam completed: Yes  Crisis Care Plan Living Arrangements: Parent, Other relatives Legal Guardian: Other:(Self) Name of Psychiatrist: [facility] ACT team  Name of Therapist: [facility] ACT team  Education Status Is patient currently in school?: No Is the patient employed, unemployed or receiving disability?: Unemployed, Receiving disability income  Risk to self with the  past 6 months Suicidal Ideation: Yes-Currently Present Has patient been a risk to self within the past 6 months prior to admission? : Yes Suicidal Intent: No-Not Currently/Within Last 6 Months Has patient had any suicidal intent within the past 6 months prior to admission? : No Is patient at risk for suicide?: No Suicidal Plan?: No Has patient had any suicidal plan within the past 6 months prior to admission? : No Access to Means: No What has been your use of drugs/alcohol within the last 12 months?: None reported Previous Attempts/Gestures: No How many times?: 0 Triggers for Past Attempts: None known Intentional Self Injurious Behavior: None Family Suicide History: Unknown Recent stressful life event(s): Other (Comment)(Unknown) Persecutory voices/beliefs?: Yes Depression: Yes Depression Symptoms: Loss of interest in usual pleasures, Isolating, Fatigue Substance abuse history and/or treatment for substance abuse?: No Suicide prevention information given to non-admitted patients: Not applicable  Risk to Others within the past 6 months Homicidal Ideation: No Does patient have any lifetime risk of violence toward others beyond the six months prior to admission? : No Thoughts of Harm to Others: No Current Homicidal Intent: No Current Homicidal Plan: No Access to Homicidal Means: No History of harm to others?: No Assessment of Violence: None Noted Does patient have access to weapons?: No Criminal Charges Pending?: No Does patient have a court date: No Is patient on probation?: No  Psychosis Hallucinations: Auditory Delusions: Persecutory  Mental Status Report Appearance/Hygiene: In scrubs Eye Contact: Poor Motor Activity: Freedom of movement Speech: Soft, Pressured Level of Consciousness: Alert Mood: Anhedonia, Depressed Affect: Blunted, Flat Anxiety Level: None Thought Processes: Thought Blocking Judgement: Impaired Orientation: Person, Place Obsessive Compulsive  Thoughts/Behaviors: None  Cognitive Functioning Concentration: Poor Memory: Recent Impaired, Remote Impaired Is patient IDD: No Insight: Poor Impulse Control: Fair Appetite: Fair Have you had any weight changes? : No Change Sleep: Unable to Assess Total Hours of Sleep: (Unknown) Vegetative Symptoms: None  ADLScreening [facility] Assessment Services) Patient's cognitive ability adequate to safely complete daily activities?: Yes Patient able to express need for assistance with ADLs?: Yes Independently performs ADLs?: Yes (appropriate for developmental age)  Prior Inpatient Therapy Prior Inpatient Therapy: Yes Prior Therapy Dates: 03/25/2018 Prior Therapy Facilty/Provider(s): [facility] Reason for Treatment: Altered Mental Status, Schizophrenia  Prior Outpatient Therapy Prior Outpatient Therapy: Yes Prior Therapy Dates: Current Prior Therapy Facilty/Provider(s): [facility] Reason for Treatment: Schizophrenia Does patient have an ACCT team?: Yes([facility] [phone]) Does patient have Intensive In-House Services?  : No Does patient have [facility] services? : No Does patient have P4CC services?: No  ADL Screening (condition at time of admission) Patient's cognitive ability adequate to safely complete daily activities?: Yes Is the patient deaf or have difficulty hearing?: No Does the patient have difficulty seeing, even when wearing glasses/contacts?: No Does the patient have difficulty concentrating, remembering, or making decisions?: No Patient able to express need for assistance with ADLs?: Yes Does the patient have difficulty dressing or bathing?: No Independently performs ADLs?: Yes (appropriate for developmental age) Does the patient have difficulty walking or climbing stairs?: No Weakness of Legs: None Weakness of Arms/Hands: None  Home Assistive Devices/Equipment Home Assistive Devices/Equipment: None  Therapy Consults (therapy  consults require a physician order) PT Evaluation Needed: No OT Evalulation Needed: No SLP Evaluation Needed: No Abuse/Neglect  Assessment (Assessment to be complete while patient is alone) Abuse/Neglect Assessment Can Be Completed: Unable to assess, patient is non-responsive or altered mental status Values / Beliefs Cultural Requests During Hospitalization: None Spiritual Requests During Hospitalization: None Consults Spiritual Care Consult Needed: No Transition of Care Team Consult Needed: No            Disposition:  Discussed patient with [facility] Psyc NP Nira Conn who agrees to have patient be observed overnight and assessed in the morning. Patient to be seen by Psyc Provider in the morning 05/16/19 Disposition Initial Assessment Completed for this Encounter: Yes  On Site Evaluation by:   Reviewed with Physician:    Benay Pike MS LCASA 05/15/2019 11:50 PM

## 2019-05-15 NOTE — ED Provider Notes (Signed)
[facility] EMERGENCY DEPARTMENT Provider Note   CSN: [id number] Arrival date & time: 05/15/19  2156     History Chief Complaint  Patient presents with  . Suicidal  . Hallucinations    Shane Nash is a 39 y.o. male history of schizophrenia here presenting with hallucination, suicidal ideation.  Patient lives at a group home and has been ACT team.  Per the IVC paperwork, patient had some suicidal thoughts today. Patient also having some hallucinations.  Had previous psychiatric admissions.  Patient states that he feels fine right now.  He has not been taking his psychiatric meds however.  When asked about that, patient states that he no longer needs them.  The history is provided by the patient and medical records.       Past Medical History:  Diagnosis Date  . Schizophrenia [facility])     Patient Active Problem List   Diagnosis Date Noted  . Tobacco use disorder 03/26/2018  . 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 Schizophrenia ([place]) 01/27/2017  . Hyponatremia 01/27/2017  . Polydipsia 01/27/2017    History reviewed. No pertinent surgical history.     No family history on file.  Social History   Tobacco Use  . Smoking status: Current Every Day Smoker    Packs/day: 0.25    Years: 15.00    Pack years: 3.75    Types: Cigarettes  . Smokeless tobacco: Never Used  . Tobacco comment: Cannot state the amount he smokes per day  Substance Use Topics  . Alcohol use: Not on file  . Drug use: No    Home Medications Prior to Admission medications   Medication Sig Start Date End Date Taking? Authorizing Provider  benztropine (COGENTIN) 2 MG tablet Take 1 tablet (2 mg total) by mouth daily. 04/02/18   Pucilowska, Jolanta B, MD  diphenhydrAMINE (BENADRYL) 50 MG capsule Take 1 capsule (50 mg total) by mouth at bedtime. 04/02/18   Pucilowska, Jolanta B, MD  haloperidol decanoate (HALDOL DECANOATE) 100 MG/ML injection Inject 1.5 mLs (150 mg total)  into the muscle every 28 (twenty-eight) days. Patient not taking: Reported on 05/07/2019 04/27/18   Clovis Fredrickson, MD    Allergies    Patient has no known allergies.  Review of Systems   Review of Systems  Psychiatric/Behavioral: Positive for hallucinations and suicidal ideas.  All other systems reviewed and are negative.   Physical Exam Updated Vital Signs BP 136/88 (BP Location: Left Arm)   Pulse (!) 57   Temp 99.5 F (37.5 C)   Resp 16   Ht 5' 8.5" (1.74 m)   Wt 83.9 kg   SpO2 99%   BMI 27.72 kg/m   Physical Exam Vitals and nursing note reviewed.  HENT:     Head: Normocephalic.     Nose: Nose normal.     Mouth/Throat:     Mouth: Mucous membranes are moist.  Eyes:     Extraocular Movements: Extraocular movements intact.     Pupils: Pupils are equal, round, and reactive to light.  Cardiovascular:     Rate and Rhythm: Normal rate and regular rhythm.     Pulses: Normal pulses.     Heart sounds: Normal heart sounds.  Pulmonary:     Effort: Pulmonary effort is normal.     Breath sounds: Normal breath sounds.  Abdominal:     Palpations: Abdomen is soft.  Musculoskeletal:        General: Normal range of motion.  Cervical back: Normal range of motion.  Skin:    General: Skin is warm.     Capillary Refill: Capillary refill takes less than 2 seconds.  Neurological:     General: No focal deficit present.     Mental Status: He is alert and oriented to person, place, and time.  Psychiatric:     Comments: Poor judgment      ED Results / Procedures / Treatments   Labs (all labs ordered are listed, but only abnormal results are displayed) Labs Reviewed  CBC WITH DIFFERENTIAL/PLATELET  COMPREHENSIVE METABOLIC PANEL  ETHANOL  URINE DRUG SCREEN, QUALITATIVE ([facility] ONLY)    EKG None  Radiology No results found.  Procedures Procedures (including critical care time)  Medications Ordered in ED Medications - No data to display  ED Course  I have  reviewed the triage vital signs and the nursing notes.  Pertinent labs & imaging results that were available during my care of the patient were reviewed by me and considered in my medical decision making (see chart for details).    MDM Rules/Calculators/A&P                      Shane Nash is a 39 y.o. male who presenting with hallucination, suicidal ideation.  Patient does have history of schizophrenia.  Patient denies any overdose.  Will get psych clearance labs and consult TTS consult.  11:27 PM Labs are unremarkable, medically cleared for psych consult.  The patient has been placed in psychiatric observation due to the need to provide a safe environment for the patient while obtaining psychiatric consultation and evaluation, as well as ongoing medical and medication management to treat the patient's condition.  The patient has been placed under full IVC at this time.   Final Clinical Impression(s) / ED Diagnoses Final diagnoses:  None    Rx / DC Orders ED Discharge Orders    None       Charlynne Pander, MD 05/15/19 2327

## 2019-05-15 NOTE — ED Notes (Signed)
Hourly rounding reveals patient sitting on hall bed. No complaints, stable, in no acute distress. Q15 minute rounds and monitoring via Rover and Officer to continue.  

## 2019-05-15 NOTE — ED Notes (Signed)
Pt. Transferred from Triage to 20 hall after dressing out and screening for contraband. Pt. Oriented to Quad including Q15 minute rounds as well as Psychologist, counselling for their protection. Patient is alert, warm and dry in no acute distress. Patient denies SI, HI, and AVH. Pt. Encouraged to let me know if needs arise.

## 2019-05-15 NOTE — ED Notes (Addendum)
Patient changed into hospital provided scrubs by this RN and Kadijah NT. Patient's belonging's placed into labeled belonging's bag. Patient's belonging's include:  Blue jeans, khaki shorts, pair grey sneakers, black t-shirt, charging cable with block, black cell phone, lighter, 2 $20 bills.

## 2019-05-16 LAB — RESPIRATORY PANEL BY RT PCR (FLU A&B, COVID)
Influenza A by PCR: NEGATIVE
Influenza B by PCR: NEGATIVE
SARS Coronavirus 2 by RT PCR: NEGATIVE

## 2019-05-16 MED ORDER — HALOPERIDOL 5 MG PO TABS
5.0000 mg | ORAL_TABLET | Freq: Two times a day (BID) | ORAL | Status: DC
  Administered 2019-05-16 (×2): 5 mg via ORAL
  Filled 2019-05-16 (×2): qty 1

## 2019-05-16 NOTE — ED Notes (Signed)
Hourly rounding reveals patient sleeping in room. No complaints, stable, in no acute distress. Q15 minute rounds and monitoring via Security Cameras to continue. 

## 2019-05-16 NOTE — ED Notes (Signed)
Pt up to the bathroom

## 2019-05-16 NOTE — ED Provider Notes (Signed)
Emergency Medicine Observation Re-evaluation Note  Shane Nash is a 39 y.o. male, seen on rounds today.  Pt initially presented to the ED for complaints of Suicidal and Hallucinations Currently, the patient is resting in no acute distress.  Physical Exam  BP 136/88 (BP Location: Left Arm)   Pulse (!) 57   Temp 99.5 F (37.5 C)   Resp 16   Ht 5' 8.5" (1.74 m)   Wt 83.9 kg   SpO2 99%   BMI 27.72 kg/m  Physical Exam  ED Course / MDM  EKG:    I have reviewed the labs performed to date as well as medications administered while in observation.  No events overnight.   Plan  Current plan is for psychiatric disposition. Patient is under full IVC at this time.   Irean Hong, MD 05/16/19 [phone]

## 2019-05-16 NOTE — ED Notes (Signed)
Pt. Was given a sandwich tray  

## 2019-05-16 NOTE — ED Notes (Signed)
Pt. Was given dinner tray and a drink. 

## 2019-05-16 NOTE — Consult Note (Addendum)
[facility] Face-to-Face Psychiatry Consult   Reason for Consult:  Psych evaluation  Referring Physician:  Dr. Erma Heritage Patient Identification: Shane Nash MRN:  [id number] Principal Diagnosis: Disorganized schizophrenia [facility]) Diagnosis:  Principal Problem:   Disorganized schizophrenia (HCC)   Total Time spent with patient: 45 minutes  Subjective:   Shane Nash is a 39 y.o. male patient admitted with bizarre behavior, under IVC by his ACT team. Per triage note Patient under IVC for SI, and audiotry hallucinations. Patient is reportedly noncompliant. He further confirms this "I do not need my medication mam. I stopped taking them ...and . " He pauses at this time and states he needs his own apartment and place to live. He continues to be disorganized and exhibiting flight of ideas. He confirms that he has an ACT team, [facility] and was recently admitted to the hospital 3 days ago. This was confirmed by chart review, patient has visited the ED 4 times in the past 2 weeks. He denies si/hi/avh.   HPI:  Per TTS: Shane Nash is an 39 y.o. male presenting to [facility] ED under IVC given by his ACT team. 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 Patient is reportedly medication noncompliant.During assessment patient was alert and oriented x2 and had difficulty answering some of the questions, patient's affect was blunted and flat and appeared to be responding to stimuli as patient would mumble to himself and look at his hands, patient also presenting with some thought blocking. When asked why patient was presenting to the ED "I was at the house and I'm trying...." patient stopped in the middle of sentence and appeared to have some thought blocking then reported "I'm trying to get my own place." Patient was able to report he is still on the ACT team with [facility]. Patient reported that his appetite is "okay" but was unable to report how is sleep is  going. Patient denied SI/HI/AH/VH when asked.  Collateral information was obtained from [facility] ACT team Delphina [phone] who is the on call staff this weekend that saw the patient tonight, she reported "we saw him Friday and we saw him using the restroom in bottles and not wanting to complete his ADLs, he was in the ED on Tuesday and after he was released we monitored him that day and the family reported that he had heightened symptoms, he wouldn't dress, he wouldn't function, he was talking to his hands and saying that someone is going to kill him or he will kill himself." "He won't take his oral meds, he has been taking his injection every month but not his oral medications, recently we realized he hadn't taken his oral meds for about 2 months."  Patient was sleep when writer approached and he was startled upon awaken. He is able to forward some information to writer however remains disorganized as noted above. He states he does not need his medications. COllateral obtained and per chart history patient with frequent visits to the ED will benefit from crisis stabilization. Will admit to the [facility] for crisis stabilization and medication management. At this time we need to restart his home medications, and reassess tomorrow for possible discharge.  He denies suicidal ideations will not admit inpatient.    Past Psychiatric History: yes   Risk to Self: Suicidal Ideation: Yes-Currently Present Suicidal Intent: No-Not Currently/Within Last 6 Months Is patient at risk for suicide?: No Suicidal Plan?: No Access to Means: No What has been  your use of drugs/alcohol within the last 12 months?: None reported How many times?: 0 Triggers for Past Attempts: None known Intentional Self Injurious Behavior: None Risk to Others: Homicidal Ideation: No Thoughts of Harm to Others: No Current Homicidal Intent: No Current Homicidal Plan: No Access to Homicidal Means: No History of harm to others?: No Assessment  of Violence: None Noted Does patient have access to weapons?: No Criminal Charges Pending?: No Does patient have a court date: No Prior Inpatient Therapy: Prior Inpatient Therapy: Yes Prior Therapy Dates: 03/25/2018 Prior Therapy Facilty/Provider(s): [facility] Reason for Treatment: Altered Mental Status, Schizophrenia Prior Outpatient Therapy: Prior Outpatient Therapy: Yes Prior Therapy Dates: Current Prior Therapy Facilty/Provider(s): [facility] Reason for Treatment: Schizophrenia Does patient have an ACCT team?: Yes([facility] [phone]) Does patient have Intensive In-House Services?  : No Does patient have [facility] services? : No Does patient have P4CC services?: No  Past Medical History:  Past Medical History:  Diagnosis Date  . Schizophrenia (HCC)    History reviewed. No pertinent surgical history. Family History: No family history on file. Family Psychiatric  History: unknown Social History:  Social History   Substance and Sexual Activity  Alcohol Use None     Social History   Substance and Sexual Activity  Drug Use No    Social History   Socioeconomic History  . Marital status: Single    Spouse name: Not on file  . Number of children: Not on file  . Years of education: Not on file  . Highest education level: Not on file  Occupational History  . Not on file  Tobacco Use  . Smoking status: Current Every Day Smoker    Packs/day: 0.25    Years: 15.00    Pack years: 3.75    Types: Cigarettes  . Smokeless tobacco: Never Used  . Tobacco comment: Cannot state the amount he smokes per day  Substance and Sexual Activity  . Alcohol use: Not on file  . Drug use: No  . Sexual activity: Not Currently  Other Topics Concern  . Not on file  Social History Narrative  . Not on file   Social Determinants of Health   Financial Resource Strain:   . Difficulty of Paying Living Expenses:   Food Insecurity:   . Worried About  Programme researcher, broadcasting/film/video in the Last Year:   . Barista in the Last Year:   Transportation Needs:   . Freight forwarder (Medical):   Marland Kitchen Lack of Transportation (Non-Medical):   Physical Activity:   . Days of Exercise per Week:   . Minutes of Exercise per Session:   Stress:   . Feeling of Stress :   Social Connections:   . Frequency of Communication with Friends and Family:   . Frequency of Social Gatherings with Friends and Family:   . Attends Religious Services:   . Active Member of Clubs or Organizations:   . Attends Banker Meetings:   Marland Kitchen Marital Status:    Additional Social History:    Allergies:  No Known Allergies  Labs:  Results for orders placed or performed during the hospital encounter of 05/15/19 (from the past 48 hour(s))  CBC with Differential/Platelet     Status: None   Collection Time: 05/15/19 10:20 PM  Result Value Ref Range   WBC 6.6 4.0 - 10.5 K/uL   RBC 4.79 4.22 - 5.81 MIL/uL   Hemoglobin 15.0 13.0 - 17.0 g/dL  HCT 44.1 39.0 - 52.0 %   MCV 92.1 80.0 - 100.0 fL   MCH 31.3 26.0 - 34.0 pg   MCHC 34.0 30.0 - 36.0 g/dL   RDW 08.6 57.8 - 46.9 %   Platelets 337 150 - 400 K/uL   nRBC 0.0 0.0 - 0.2 %   Neutrophils Relative % 51 %   Neutro Abs 3.3 1.7 - 7.7 K/uL   Lymphocytes Relative 34 %   Lymphs Abs 2.2 0.7 - 4.0 K/uL   Monocytes Relative 12 %   Monocytes Absolute 0.8 0.1 - 1.0 K/uL   Eosinophils Relative 3 %   Eosinophils Absolute 0.2 0.0 - 0.5 K/uL   Basophils Relative 0 %   Basophils Absolute 0.0 0.0 - 0.1 K/uL   Immature Granulocytes 0 %   Abs Immature Granulocytes 0.02 0.00 - 0.07 K/uL    Comment: Performed at [facility], [address] [place], [place] [address]  Comprehensive metabolic panel     Status: Abnormal   Collection Time: 05/15/19 10:20 PM  Result Value Ref Range   Sodium 138 135 - 145 mmol/L   Potassium 3.8 3.5 - 5.1 mmol/L   Chloride 104 98 - 111 mmol/L   CO2 25 22 - 32 mmol/L   Glucose, Bld 85 70  - 99 mg/dL    Comment: Glucose reference range applies only to samples taken after fasting for at least 8 hours.   BUN 13 6 - 20 mg/dL   Creatinine, Ser 8.41 0.61 - 1.24 mg/dL   Calcium 9.2 8.9 - 32.4 mg/dL   Total Protein 8.5 (H) 6.5 - 8.1 g/dL   Albumin 4.3 3.5 - 5.0 g/dL   AST 20 15 - 41 U/L   ALT 20 0 - 44 U/L   Alkaline Phosphatase 88 38 - 126 U/L   Total Bilirubin 0.9 0.3 - 1.2 mg/dL   GFR calc non Af Amer >60 >60 mL/min   GFR calc Af Amer >60 >60 mL/min   Anion gap 9 5 - 15    Comment: Performed at [facility], [address] [place], [place] [address]  Ethanol     Status: None   Collection Time: 05/15/19 10:20 PM  Result Value Ref Range   Alcohol, Ethyl (B) <10 <10 mg/dL    Comment: (NOTE) Lowest detectable limit for serum alcohol is 10 mg/dL. For medical purposes only. Performed at [facility], [address] [place], [place] [address]   Respiratory Panel by RT PCR (Flu A&B, Covid) - Nasopharyngeal Swab     Status: None   Collection Time: 05/16/19 12:58 AM   Specimen: Nasopharyngeal Swab  Result Value Ref Range   SARS Coronavirus 2 by RT PCR NEGATIVE NEGATIVE    Comment: (NOTE) SARS-CoV-2 target nucleic acids are NOT DETECTED. The SARS-CoV-2 RNA is generally detectable in upper respiratoy specimens during the acute phase of infection. The lowest concentration of SARS-CoV-2 viral copies this assay can detect is 131 copies/mL. A negative result does not preclude SARS-Cov-2 infection and should not be used as the sole basis for treatment or other patient management decisions. A negative result may occur with  improper specimen collection/handling, submission of specimen other than nasopharyngeal swab, presence of viral mutation(s) within the areas targeted by this assay, and inadequate number of viral copies (<131 copies/mL). A negative result must be combined with clinical observations, patient history, and epidemiological information.  The expected result is Negative. Fact Sheet for Patients:  https://www.moore.com/ Fact Sheet for  Healthcare Providers:  GravelBags.it This test is not yet ap proved or cleared by the [place] and  has been authorized for detection and/or diagnosis of SARS-CoV-2 by FDA under an Emergency Use Authorization (EUA). This EUA will remain  in effect (meaning this test can be used) for the duration of the COVID-19 declaration under Section 564(b)(1) of the Act, 21 U.S.C. section 360bbb-3(b)(1), unless the authorization is terminated or revoked sooner.    Influenza A by PCR NEGATIVE NEGATIVE   Influenza B by PCR NEGATIVE NEGATIVE    Comment: (NOTE) The Xpert Xpress SARS-CoV-2/FLU/RSV assay is intended as an aid in  the diagnosis of influenza from Nasopharyngeal swab specimens and  should not be used as a sole basis for treatment. Nasal washings and  aspirates are unacceptable for Xpert Xpress SARS-CoV-2/FLU/RSV  testing. Fact Sheet for Patients: PinkCheek.be Fact Sheet for Healthcare Providers: GravelBags.it This test is not yet approved or cleared by the [place] FDA and  has been authorized for detection and/or diagnosis of SARS-CoV-2 by  FDA under an Emergency Use Authorization (EUA). This EUA will remain  in effect (meaning this test can be used) for the duration of the  Covid-19 declaration under Section 564(b)(1) of the Act, 21  U.S.C. section 360bbb-3(b)(1), unless the authorization is  terminated or revoked. Performed at [facility], [address] [place], [place] [address]     No current facility-administered medications for this encounter.   Current Outpatient Medications  Medication Sig Dispense Refill  . benztropine (COGENTIN) 2 MG tablet Take 1 tablet (2 mg total) by mouth daily. 30 tablet 1  . diphenhydrAMINE (BENADRYL) 50 MG capsule Take 1  capsule (50 mg total) by mouth at bedtime. 30 capsule 1  . haloperidol decanoate (HALDOL DECANOATE) 100 MG/ML injection Inject 1.5 mLs (150 mg total) into the muscle every 28 (twenty-eight) days. (Patient not taking: Reported on 05/07/2019) 1 mL 1    Musculoskeletal: Strength & Muscle Tone: within normal limits Gait & Station: unsteady Patient leans: N/A  Psychiatric Specialty Exam: Physical Exam   Review of Systems   Blood pressure (!) 146/87, pulse 92, temperature 98.8 F (37.1 C), temperature source Oral, resp. rate 19, height 5' 8.5" (1.74 m), weight 83.9 kg, SpO2 99 %.Body mass index is 27.72 kg/m.  General Appearance: Bizarre  Eye Contact:  Minimal  Speech:  Slow, Slurred and some thought blocking  Volume:  Normal  Mood:  Dysphoric  Affect:  Inappropriate  Thought Process:  Disorganized, Irrelevant and Descriptions of Associations: Loose  Orientation:  Full (Time, Place, and Person)  Thought Content:  Logical and Illogical  Suicidal Thoughts:  No  Homicidal Thoughts:  No  Memory:  Recent;   Poor  Judgement:  Impaired  Insight:  Lacking  Psychomotor Activity:  Decreased  Concentration:  Concentration: Poor  Recall:  [place] of Knowledge:  Poor  Language:  Fair  Akathisia:  NA  Handed:  Right  AIMS (if indicated):     Assets:  Others:  na  ADL's:  Impaired  Cognition:  Impaired,  Severe  Sleep:        Treatment Plan Summary: Daily contact with patient to assess and evaluate symptoms and progress in treatment, Medication management and Plan Restart home medications and resassess the possibility to discharge home tomorrow.  Will start haldol 5mg  po BID for acute stabilization of psychosis. Will need to confirm last date of Haldol dec.   Disposition: No evidence of imminent risk to  self or others at present.   Supportive therapy provided about ongoing stressors. Follow up with [facility] once patient is discharged.   Maryagnes Amosakia S Starkes-Perry, FNP 05/16/2019 11:21 AM

## 2019-05-16 NOTE — ED Notes (Signed)
Meal tray sat at bedside due to pt sleeping

## 2019-05-16 NOTE — ED Notes (Signed)
Pt out of the bathroom and did not give specimen. Returned to room then came back to the day room. Had to encourage to go back to bed.

## 2019-05-16 NOTE — ED Notes (Signed)
Hourly rounding reveals patient sitting on hall bed. No complaints, stable, in no acute distress. Q15 minute rounds and monitoring via Psychologist, counselling to continue.

## 2019-05-16 NOTE — ED Notes (Signed)
Report to include Situation, Background, Assessment, and Recommendations received from Melina Copa. Patient alert and, warm and dry, in no acute distress. Patient denies SI, HI, AVH and pain. Patient made aware of Q15 minute rounds and security cameras for their safety. Patient instructed to come to me with needs or concerns.

## 2019-05-16 NOTE — ED Notes (Signed)
Pt brought into ED BHU via sally port and wand with metal detector for safety by [place] Security officer. Patient oriented to unit/care area: Pt informed of unit policies and procedures.  Informed that, for their safety, care areas are designed for safety and monitored by security cameras at all times. Patient verbalizes understanding, and verbal contract for safety obtained.Pt shown to their room.  

## 2019-05-16 NOTE — ED Notes (Signed)
Hourly rounding reveals patient awake in hall bed. No complaints, stable, in no acute distress. Q15 minute rounds and monitoring via Rover and Officer to continue.  

## 2019-05-16 NOTE — ED Notes (Signed)
Meal tray given. Pt goes back to sleep.

## 2019-05-17 ENCOUNTER — Other Ambulatory Visit: Payer: Self-pay

## 2019-05-17 ENCOUNTER — Encounter: Payer: Self-pay | Admitting: Psychiatry

## 2019-05-17 ENCOUNTER — Inpatient Hospital Stay
Admission: AD | Admit: 2019-05-17 | Discharge: 2019-05-25 | DRG: 885 | Disposition: A | Discharge status: Home / Self Care | Payer: Medicaid Other | Source: Intra-hospital | Attending: Psychiatry | Admitting: Psychiatry

## 2019-05-17 DIAGNOSIS — R4689 Other symptoms and signs involving appearance and behavior: Secondary | ICD-10-CM | POA: Diagnosis present

## 2019-05-17 DIAGNOSIS — F259 Schizoaffective disorder, unspecified: Secondary | ICD-10-CM | POA: Diagnosis present

## 2019-05-17 DIAGNOSIS — F3160 Bipolar disorder, current episode mixed, unspecified: Secondary | ICD-10-CM | POA: Diagnosis present

## 2019-05-17 DIAGNOSIS — F203 Undifferentiated schizophrenia: Secondary | ICD-10-CM | POA: Diagnosis not present

## 2019-05-17 DIAGNOSIS — F17210 Nicotine dependence, cigarettes, uncomplicated: Secondary | ICD-10-CM | POA: Diagnosis present

## 2019-05-17 DIAGNOSIS — F209 Schizophrenia, unspecified: Secondary | ICD-10-CM | POA: Diagnosis present

## 2019-05-17 DIAGNOSIS — G2401 Drug induced subacute dyskinesia: Secondary | ICD-10-CM | POA: Diagnosis present

## 2019-05-17 DIAGNOSIS — R631 Polydipsia: Secondary | ICD-10-CM | POA: Diagnosis present

## 2019-05-17 MED ORDER — HYDROXYZINE HCL 25 MG PO TABS: 25.0000 mg | ORAL_TABLET | Freq: Three times a day (TID) | ORAL | Status: DC | PRN

## 2019-05-17 MED ORDER — TRAZODONE HCL 50 MG PO TABS
50.0000 mg | ORAL_TABLET | Freq: Every evening | ORAL | Status: DC | PRN
  Administered 2019-05-20 – 2019-05-24 (×3): 50 mg via ORAL
  Filled 2019-05-17 (×3): qty 1

## 2019-05-17 MED ORDER — HALOPERIDOL 2 MG PO TABS
2.0000 mg | ORAL_TABLET | Freq: Two times a day (BID) | ORAL | Status: DC
  Administered 2019-05-17: 2 mg via ORAL
  Filled 2019-05-17 (×2): qty 1

## 2019-05-17 MED ORDER — MAGNESIUM HYDROXIDE 400 MG/5ML PO SUSP: 30.0000 mL | Freq: Every day | ORAL | Status: DC | PRN

## 2019-05-17 MED ORDER — ACETAMINOPHEN 325 MG PO TABS: 650.0000 mg | ORAL_TABLET | Freq: Four times a day (QID) | ORAL | Status: DC | PRN

## 2019-05-17 MED ORDER — ALUM & MAG HYDROXIDE-SIMETH 200-200-20 MG/5ML PO SUSP: 30.0000 mL | ORAL | Status: DC | PRN

## 2019-05-17 NOTE — Tx Team (Signed)
Initial Treatment Plan 05/17/2019 11:14 PM Shane Nash ZDK:[id number]    PATIENT STRESSORS: Marital or family conflict Medication change or noncompliance   PATIENT STRENGTHS: Motivation for treatment/growth Supportive family/friends   PATIENT IDENTIFIED PROBLEMS: Suicidal Ideation  Anxiety  Psychosis                 DISCHARGE CRITERIA:  Motivation to continue treatment in a less acute level of care Verbal commitment to aftercare and medication compliance  PRELIMINARY DISCHARGE PLAN: Outpatient therapy Return to previous living arrangement  PATIENT/FAMILY INVOLVEMENT: This treatment plan has been presented to and reviewed with the patient, [name]. The patient has been given the opportunity to ask questions and make suggestions.  Elmyra Ricks, RN 05/17/2019, 11:14 PM

## 2019-05-17 NOTE — BH Assessment (Signed)
Patient is to be admitted to [facility] by Dr. Toni Amend .  Attending Physician will be. Cindy Hazy, MD.   Patient has been assigned to room 312, by [facility] Charge Nurse Doyce Para, RN.   Intake Paper Work has been signed and placed on patient chart.  ER staff is aware of the admission: 1. Misty Stanley, ER Secretary  2. Roxan Hockey, ER MD  3. Geralynn Ochs Patient's Nurse  4. THO Patient Access.

## 2019-05-17 NOTE — Progress Notes (Signed)
Patient was admitted from [facility], report received from Maralyn Sago, RN. Patient was pleasant during assessment. When patient was asked assessment questions patient stated, "They aren't getting any more blood out of me." Patient given education. Patient oriented to the unit and his room - given snack. Patient did state "I need my own place, I am tired of living with my family." When asked what has been going on with his family, patient wouldn't respond. Patient being monitored Q 15 minutes for safety per unit protocol. Patient remains safe on the unit.

## 2019-05-17 NOTE — ED Provider Notes (Signed)
Emergency Medicine Observation Re-evaluation Note  Shane Nash is a 39 y.o. male, seen on rounds today.  Pt initially presented to the ED for complaints of Suicidal and Hallucinations Currently, the patient is sleeping.  Physical Exam  BP 121/66 (BP Location: Right Arm)   Pulse 80   Temp 97.8 F (36.6 C) (Oral)   Resp 20   Ht 5' 8.5" (1.74 m)   Wt 83.9 kg   SpO2 100%   BMI 27.72 kg/m  Physical Exam  ED Course / MDM  EKG:    I have reviewed the labs performed to date as well as medications administered while in observation.  Recent changes in the last 24 hours include none. Plan  Current plan is for psych dispo. Patient is under full IVC at this time.   Don Perking, [place], MD 05/17/19 [phone]

## 2019-05-17 NOTE — TOC Initial Note (Signed)
Transition of Care [facility]) - Initial/Assessment Note    Patient Details  Name: Shane Nash MRN: [id number] Date of Birth: 16-Oct-1980  Transition of Care [facility] CM/SW Contact:    Marina Goodell Phone Number: [phone] 05/17/2019, 8:50 AM  Clinical Narrative:                 Patient presents to [facility]/ED due to increased behaviors and medication non-compliance from [employer]. ([facility]), Delphina [phone].  Delphina contact the CSW and requested a status update.  This CSw informed Delphina the patient has been psychiatrically cleared but will be under observation until stabilized.  This CSW stated she would give Delphina's contacted information to the [facility]/ED for them to contact her on patient status once he is stabilized.  Delphina stated the patient is scheduled to receive his [place] shot tomorrow.       Patient Goals and CMS Choice        Expected Discharge Plan and Services                                                Prior Living Arrangements/Services                       Activities of Daily Living Home Assistive Devices/Equipment: None ADL Screening (condition at time of admission) Patient's cognitive ability adequate to safely complete daily activities?: Yes Is the patient deaf or have difficulty hearing?: No Does the patient have difficulty seeing, even when wearing glasses/contacts?: No Does the patient have difficulty concentrating, remembering, or making decisions?: No Patient able to express need for assistance with ADLs?: Yes Does the patient have difficulty dressing or bathing?: No Independently performs ADLs?: Yes (appropriate for developmental age) Does the patient have difficulty walking or climbing stairs?: No Weakness of Legs: None Weakness of Arms/Hands: None  Permission Sought/Granted                  Emotional Assessment              Admission diagnosis:  IVC Patient Active Problem  List   Diagnosis Date Noted  . Tobacco use disorder 03/26/2018  . 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 Schizophrenia (HCC) 01/27/2017  . Hyponatremia 01/27/2017  . Polydipsia 01/27/2017   PCP:  Derwood Kaplan, MD Pharmacy:   [facility] - [place], [place] - [address] [place] [address] Phone: [phone] Fax: [phone]     Social Determinants of Health (SDOH) Interventions    Readmission Risk Interventions No flowsheet data found.

## 2019-05-17 NOTE — ED Notes (Signed)
Hourly rounding reveals patient sleeping in room. No complaints, stable, in no acute distress. Q15 minute rounds and monitoring via Security Cameras to continue. 

## 2019-05-17 NOTE — BH Assessment (Signed)
TTS and Psych reassessment completed. Pt continues to endorse delusional and noncoherent presentation. Pt was observed to be responding to internal stimuli and was unable to confirm his safety.   Follow up with ACT Team lead Delphina, [phone]: Delphina confirmed pt IVC due to his delusional, catatonic and noncoherent presentation yesterday during treatment. Delphina reported a few hours after pt discharge last week he began expressing thoughts of wanting to dying, refused medications, exhibited poor insight, inability to perform ADLS, to be responding to internal stimuli and presenting catatonic at times. Delphina expressed the need for Pt to become stablized on his medications and coherent with medications upon discharge.   Per Cindy Hazy, MD pt is reccommended for inpatient treatment for medication management.

## 2019-05-17 NOTE — ED Notes (Signed)
Ice water given

## 2019-05-17 NOTE — ED Notes (Signed)
IVC  GOING  TO  [facility]  TONIGHT 

## 2019-05-17 NOTE — Consult Note (Signed)
[facility] Face-to-Face Psychiatry Consult   Reason for Consult:  Psych evaluation  Referring Physician:  Dr. Erma Heritage Patient Identification: Shane Nash MRN:  [id number] Principal Diagnosis: Disorganized schizophrenia [facility]) Diagnosis:  Principal Problem:   Disorganized schizophrenia (HCC)   Total Time spent with patient: 15 minutes  Recent readmission. Given 5mg  2x per day haloperidol last time which led to overmedication. Discharged off medication. Symptoms returned and he was placed on the same dose per day (10mg ).   Subjective:   Shane Nash is a 39 y.o. male patient admitted with bizarre behavior, under IVC by his ACT team. Per triage note Patient under IVC for SI, and audiotry hallucinations. Patient is reportedly noncompliant. He further confirms this "I do not need my medication mam. I stopped taking them ...and . " He pauses at this time and states he needs his own apartment and place to live. He continues to be disorganized and exhibiting flight of ideas. He confirms that he has an ACT team, [facility] and was recently admitted to the hospital 3 days ago. This was confirmed by chart review, patient has visited the ED 4 times in the past 2 weeks. He denies si/hi/avh.   HPI:  Per TTS: Shane Nash is an 39 y.o. male presenting to [facility] ED under IVC given by his ACT team. 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 Patient is reportedly medication noncompliant.During assessment patient was alert and oriented x2 and had difficulty answering some of the questions, patient's affect was blunted and flat and appeared to be responding to stimuli as patient would mumble to himself and look at his hands, patient also presenting with some thought blocking. When asked why patient was presenting to the ED "I was at the house and I'm trying...." patient stopped in the middle of sentence and appeared to have some thought blocking then reported "I'm trying to  get my own place." Patient was able to report he is still on the ACT team with [facility]. Patient reported that his appetite is "okay" but was unable to report how is sleep is going. Patient denied SI/HI/AH/VH when asked.  Collateral information was obtained from [facility] ACT team Delphina [phone] who is the on call staff this weekend that saw the patient tonight, she reported "we saw him Friday and we saw him using the restroom in bottles and not wanting to complete his ADLs, he was in the ED on Tuesday and after he was released we monitored him that day and the family reported that he had heightened symptoms, he wouldn't dress, he wouldn't function, he was talking to his hands and saying that someone is going to kill him or he will kill himself." "He won't take his oral meds, he has been taking his injection every month but not his oral medications, recently we realized he hadn't taken his oral meds for about 2 months."  Patient was sleep when writer approached and he was startled upon awaken. He is able to forward some information to writer however remains disorganized as noted above. He states he does not need his medications. COllateral obtained and per chart history patient with frequent visits to the ED will benefit from crisis stabilization. Will admit to the [facility] for crisis stabilization and medication management. At this time we need to restart his home medications, and reassess tomorrow for possible discharge.  He denies suicidal ideations will not admit inpatient.    Past Psychiatric History: yes  Risk to Self: Suicidal Ideation: Yes-Currently Present Suicidal Intent: No-Not Currently/Within Last 6 Months Is patient at risk for suicide?: No Suicidal Plan?: No Access to Means: No What has been your use of drugs/alcohol within the last 12 months?: None reported How many times?: 0 Triggers for Past Attempts: None known Intentional Self Injurious Behavior: None Risk to  Others: Homicidal Ideation: No Thoughts of Harm to Others: No Current Homicidal Intent: No Current Homicidal Plan: No Access to Homicidal Means: No History of harm to others?: No Assessment of Violence: None Noted Does patient have access to weapons?: No Criminal Charges Pending?: No Does patient have a court date: No Prior Inpatient Therapy: Prior Inpatient Therapy: Yes Prior Therapy Dates: 03/25/2018 Prior Therapy Facilty/Provider(s): [facility] Reason for Treatment: Altered Mental Status, Schizophrenia Prior Outpatient Therapy: Prior Outpatient Therapy: Yes Prior Therapy Dates: Current Prior Therapy Facilty/Provider(s): [facility] Team Reason for Treatment: Schizophrenia Does patient have an ACCT team?: Yes([facility] [phone]) Does patient have Intensive In-House Services?  : No Does patient have [facility] services? : No Does patient have P4CC services?: No  Past Medical History:  Past Medical History:  Diagnosis Date  . Schizophrenia (HCC)    History reviewed. No pertinent surgical history. Family History: No family history on file. Family Psychiatric  History: unknown Social History:  Social History   Substance and Sexual Activity  Alcohol Use None     Social History   Substance and Sexual Activity  Drug Use No    Social History   Socioeconomic History  . Marital status: Single    Spouse name: Not on file  . Number of children: Not on file  . Years of education: Not on file  . Highest education level: Not on file  Occupational History  . Not on file  Tobacco Use  . Smoking status: Current Every Day Smoker    Packs/day: 0.25    Years: 15.00    Pack years: 3.75    Types: Cigarettes  . Smokeless tobacco: Never Used  . Tobacco comment: Cannot state the amount he smokes per day  Substance and Sexual Activity  . Alcohol use: Not on file  . Drug use: No  . Sexual activity: Not Currently  Other Topics Concern  . Not on  file  Social History Narrative  . Not on file   Social Determinants of Health   Financial Resource Strain:   . Difficulty of Paying Living Expenses:   Food Insecurity:   . Worried About Programme researcher, broadcasting/film/video in the Last Year:   . Barista in the Last Year:   Transportation Needs:   . Freight forwarder (Medical):   Marland Kitchen Lack of Transportation (Non-Medical):   Physical Activity:   . Days of Exercise per Week:   . Minutes of Exercise per Session:   Stress:   . Feeling of Stress :   Social Connections:   . Frequency of Communication with Friends and Family:   . Frequency of Social Gatherings with Friends and Family:   . Attends Religious Services:   . Active Member of Clubs or Organizations:   . Attends Banker Meetings:   Marland Kitchen Marital Status:    Additional Social History:    Allergies:  No Known Allergies  Labs:  Results for orders placed or performed during the hospital encounter of 05/15/19 (from the past 48 hour(s))  CBC with Differential/Platelet     Status: None   Collection Time: 05/15/19 10:20 PM  Result Value Ref Range   WBC 6.6 4.0 - 10.5 K/uL   RBC 4.79 4.22 - 5.81 MIL/uL   Hemoglobin 15.0 13.0 - 17.0 g/dL   HCT 16.144.1 09.639.0 - 04.552.0 %   MCV 92.1 80.0 - 100.0 fL   MCH 31.3 26.0 - 34.0 pg   MCHC 34.0 30.0 - 36.0 g/dL   RDW 40.912.7 81.111.5 - 91.415.5 %   Platelets 337 150 - 400 K/uL   nRBC 0.0 0.0 - 0.2 %   Neutrophils Relative % 51 %   Neutro Abs 3.3 1.7 - 7.7 K/uL   Lymphocytes Relative 34 %   Lymphs Abs 2.2 0.7 - 4.0 K/uL   Monocytes Relative 12 %   Monocytes Absolute 0.8 0.1 - 1.0 K/uL   Eosinophils Relative 3 %   Eosinophils Absolute 0.2 0.0 - 0.5 K/uL   Basophils Relative 0 %   Basophils Absolute 0.0 0.0 - 0.1 K/uL   Immature Granulocytes 0 %   Abs Immature Granulocytes 0.02 0.00 - 0.07 K/uL    Comment: Performed at [facility], [address] [place], [place] [address]  Comprehensive metabolic panel     Status: Abnormal    Collection Time: 05/15/19 10:20 PM  Result Value Ref Range   Sodium 138 135 - 145 mmol/L   Potassium 3.8 3.5 - 5.1 mmol/L   Chloride 104 98 - 111 mmol/L   CO2 25 22 - 32 mmol/L   Glucose, Bld 85 70 - 99 mg/dL    Comment: Glucose reference range applies only to samples taken after fasting for at least 8 hours.   BUN 13 6 - 20 mg/dL   Creatinine, Ser 6.211.01 0.61 - 1.24 mg/dL   Calcium 9.2 8.9 - 30.810.3 mg/dL   Total Protein 8.5 (H) 6.5 - 8.1 g/dL   Albumin 4.3 3.5 - 5.0 g/dL   AST 20 15 - 41 U/L   ALT 20 0 - 44 U/L   Alkaline Phosphatase 88 38 - 126 U/L   Total Bilirubin 0.9 0.3 - 1.2 mg/dL   GFR calc non Af Amer >60 >60 mL/min   GFR calc Af Amer >60 >60 mL/min   Anion gap 9 5 - 15    Comment: Performed at [facility], [address] [place], [place] [address]  Ethanol     Status: None   Collection Time: 05/15/19 10:20 PM  Result Value Ref Range   Alcohol, Ethyl (B) <10 <10 mg/dL    Comment: (NOTE) Lowest detectable limit for serum alcohol is 10 mg/dL. For medical purposes only. Performed at [facility], [address] [place], [place] [address]   Respiratory Panel by RT PCR (Flu A&B, Covid) - Nasopharyngeal Swab     Status: None   Collection Time: 05/16/19 12:58 AM   Specimen: Nasopharyngeal Swab  Result Value Ref Range   SARS Coronavirus 2 by RT PCR NEGATIVE NEGATIVE    Comment: (NOTE) SARS-CoV-2 target nucleic acids are NOT DETECTED. The SARS-CoV-2 RNA is generally detectable in upper respiratoy specimens during the acute phase of infection. The lowest concentration of SARS-CoV-2 viral copies this assay can detect is 131 copies/mL. A negative result does not preclude SARS-Cov-2 infection and should not be used as the sole basis for treatment or other patient management decisions. A negative result may occur with  improper specimen collection/handling, submission of specimen other than nasopharyngeal swab, presence of viral mutation(s) within the areas  targeted by this assay, and inadequate number of viral copies (<  131 copies/mL). A negative result must be combined with clinical observations, patient history, and epidemiological information. The expected result is Negative. Fact Sheet for Patients:  PinkCheek.be Fact Sheet for Healthcare Providers:  GravelBags.it This test is not yet ap proved or cleared by the [place] FDA and  has been authorized for detection and/or diagnosis of SARS-CoV-2 by FDA under an Emergency Use Authorization (EUA). This EUA will remain  in effect (meaning this test can be used) for the duration of the COVID-19 declaration under Section 564(b)(1) of the Act, 21 U.S.C. section 360bbb-3(b)(1), unless the authorization is terminated or revoked sooner.    Influenza A by PCR NEGATIVE NEGATIVE   Influenza B by PCR NEGATIVE NEGATIVE    Comment: (NOTE) The Xpert Xpress SARS-CoV-2/FLU/RSV assay is intended as an aid in  the diagnosis of influenza from Nasopharyngeal swab specimens and  should not be used as a sole basis for treatment. Nasal washings and  aspirates are unacceptable for Xpert Xpress SARS-CoV-2/FLU/RSV  testing. Fact Sheet for Patients: PinkCheek.be Fact Sheet for Healthcare Providers: GravelBags.it This test is not yet approved or cleared by the [place] FDA and  has been authorized for detection and/or diagnosis of SARS-CoV-2 by  FDA under an Emergency Use Authorization (EUA). This EUA will remain  in effect (meaning this test can be used) for the duration of the  Covid-19 declaration under Section 564(b)(1) of the Act, 21  U.S.C. section 360bbb-3(b)(1), unless the authorization is  terminated or revoked. Performed at [facility], [address] [place], [place] [address]     Current Facility-Administered Medications  Medication Dose Route Frequency  Provider Last Rate Last Admin  . haloperidol (HALDOL) tablet 2 mg  2 mg Oral BID Alesia Morin, MD       Current Outpatient Medications  Medication Sig Dispense Refill  . benztropine (COGENTIN) 2 MG tablet Take 1 tablet (2 mg total) by mouth daily. (Patient taking differently: Take 2 mg by mouth at bedtime. ) 30 tablet 1  . diphenhydrAMINE (BENADRYL) 50 MG capsule Take 1 capsule (50 mg total) by mouth at bedtime. 30 capsule 1  . INVEGA SUSTENNA 234 MG/1.5ML SUSY injection Inject 234 mg into the muscle every 30 (thirty) days.      Psychiatric Specialty Exam: Physical Exam  Review of Systems  Blood pressure 121/66, pulse 80, temperature 97.8 F (36.6 C), temperature source Oral, resp. rate 20, height 5' 8.5" (1.74 m), weight 83.9 kg, SpO2 100 %.Body mass index is 27.72 kg/m.  General Appearance: Bizarre  Eye Contact:  Minimal  Speech:  Slow, Slurred and some thought blocking  Volume:  Normal  Mood:  Dysphoric  Affect:  Inappropriate  Thought Process:  Disorganized, Irrelevant and Descriptions of Associations: Loose  Orientation:  Full (Time, Place, and Person)  Thought Content:  Logical and Illogical  Suicidal Thoughts:  No  Homicidal Thoughts:  No  Memory:  Recent;   Poor  Judgement:  Impaired  Insight:  Lacking  Psychomotor Activity:  Decreased  Concentration:  Concentration: Poor  Recall:  [place] of Knowledge:  Poor  Language:  Fair  Akathisia:  NA  Handed:  Right  AIMS (if indicated):     Assets:  Others:  na  ADL's:  Impaired  Cognition:  Impaired,  Severe  Sleep:        Treatment Plan Summary: 5 mg haloperidol D/ced and reduced to 2mg  BID as he was again with mouth movements and drooling.  Based on prior  admission/discharge and clear evidence of value and side effects from medications he requires a longer inpatient evaluation to achieve a stable medication regiment that will work in the longer term   Disposition: persue inpatient treatment  Cindy Hazy, MD 05/17/2019 12:41 PM

## 2019-05-17 NOTE — Plan of Care (Signed)
Patient new to the unit tonight  Problem: Education: Goal: Knowledge of [place] [facility] Education information/materials will improve Outcome: Not Progressing Goal: Emotional status will improve Outcome: Not Progressing Goal: Mental status will improve Outcome: Not Progressing Goal: Verbalization of understanding the information provided will improve Outcome: Not Progressing   Problem: Safety: Goal: Periods of time without injury will increase Outcome: Not Progressing   Problem: Activity: Goal: Will verbalize the importance of balancing activity with adequate rest periods Outcome: Not Progressing   Problem: Safety: Goal: Ability to redirect hostility and anger into socially appropriate behaviors will improve Outcome: Not Progressing Goal: Ability to remain free from injury will improve Outcome: Not Progressing   

## 2019-05-18 DIAGNOSIS — F203 Undifferentiated schizophrenia: Secondary | ICD-10-CM

## 2019-05-18 DIAGNOSIS — G2401 Drug induced subacute dyskinesia: Secondary | ICD-10-CM

## 2019-05-18 MED ORDER — PALIPERIDONE PALMITATE ER 234 MG/1.5ML IM SUSY
234.0000 mg | PREFILLED_SYRINGE | INTRAMUSCULAR | Status: DC
  Administered 2019-05-18: 234 mg via INTRAMUSCULAR
  Filled 2019-05-18: qty 1.5

## 2019-05-18 MED ORDER — BENZTROPINE MESYLATE 1 MG PO TABS
1.0000 mg | ORAL_TABLET | Freq: Two times a day (BID) | ORAL | Status: DC
  Administered 2019-05-18 – 2019-05-25 (×9): 1 mg via ORAL
  Filled 2019-05-18 (×12): qty 1

## 2019-05-18 MED ORDER — DIVALPROEX SODIUM ER 500 MG PO TB24
500.0000 mg | ORAL_TABLET | Freq: Every day | ORAL | Status: DC
  Administered 2019-05-18 – 2019-05-24 (×7): 500 mg via ORAL
  Filled 2019-05-18 (×6): qty 1

## 2019-05-18 MED ORDER — HYDROXYZINE HCL 50 MG PO TABS
50.0000 mg | ORAL_TABLET | Freq: Four times a day (QID) | ORAL | Status: DC | PRN
  Administered 2019-05-20 – 2019-05-24 (×2): 50 mg via ORAL
  Filled 2019-05-18 (×2): qty 1

## 2019-05-18 NOTE — Progress Notes (Signed)
Recreation Therapy Notes  INPATIENT RECREATION THERAPY ASSESSMENT  Patient Details Name: NAETHAN BRACEWELL MRN: [id number] DOB: 13-Aug-1980 Today's Date: 05/18/2019       Information Obtained From: Patient  Able to Participate in Assessment/Interview: Yes  Patient Presentation: Responsive  Reason for Admission (Per Patient): Active Symptoms  Patient Stressors:    Coping Skills:   Sports, TV, Prayer  Leisure Interests (2+):  Sports - Basketball, Music - Listen, Individual - TV  Frequency of Recreation/Participation: Monthly  Awareness of Community Resources:     [employer]:     Current Use:    If no, Barriers?:    Expressed Interest in [employer] Information:    [employer] of Residence:  Film/video editor  Patient Main Form of Transportation: Walk  Patient Strengths:  N/A  Patient Identified Areas of Improvement:  Getting a job  Patient Goal for Hospitalization:  Get my own house and job  Current SI (including self-harm):  No  Current HI:  No  Current AVH: No  Staff Intervention Plan: Group Attendance, Collaborate with Interdisciplinary Treatment Team  Consent to Intern Participation: N/A  Cindee Mclester 05/18/2019, 3:53 PM

## 2019-05-18 NOTE — [facility] Group Notes (Signed)
[facility] Group Notes:  (Nursing/MHT/Case Management/Adjunct)  Date:  05/18/2019  Time:  9:12 AM  Type of Therapy:  Community Meeting  Participation Level:  Active  Participation Quality:  Appropriate, Attentive and Sharing  Affect:  Appropriate  Cognitive:  Alert and Appropriate  Insight:  Appropriate  Engagement in Group:  Engaged  Modes of Intervention:  Discussion, Education and Support  Summary of Progress/Problems:  Shane Nash 05/18/2019, 9:12 AM

## 2019-05-18 NOTE — [facility] Counselor (Signed)
CSW spoke with Cordelia Pen, nurse on [facility] ACT team.  Cordelia Pen reports that the patient is due for an [place] shot today. She reports that she will fax the patient's medication list to this CSW.  CSW got clarification on the patient's living arrangements.  Per nurse the patient lives with his nephew.  CSW obtained the contact information for the patient's family.     Penni Homans, MSW, LCSW 05/18/2019 10:53 AM

## 2019-05-18 NOTE — [facility] Suicide Risk Assessment (Signed)
[facility] Admission Suicide Risk Assessment   Nursing information obtained from:  Patient Demographic factors:  NA Current Mental Status:  NA Loss Factors:  NA Historical Factors:  NA Risk Reduction Factors:  NA  Total Time spent with patient: 1 hour Principal Problem: Schizophrenia (HCC) Diagnosis:  Principal Problem:   Schizophrenia (HCC) Active Problems:   Polydipsia   Disorganized behavior   Tardive dyskinesia  Subjective Data: Patient seen and chart reviewed.  This is a 39 year old man with a known history of chronic mental illness brought to the hospital by his act team.  Evidently he has been off his medicine or perhaps just decompensating recently.  He had come to the emergency room several times over the last few days initially very disorganized until given some medication.  Patient has not shown any suicidal behavior and denies any suicidal thoughts currently.  Continued Clinical Symptoms:  Alcohol Use Disorder Identification Test Final Score (AUDIT): 0 The "Alcohol Use Disorders Identification Test", Guidelines for Use in Primary Care, Second Edition.  World Science writer [facility]). Score between 0-7:  no or low risk or alcohol related problems. Score between 8-15:  moderate risk of alcohol related problems. Score between 16-19:  high risk of alcohol related problems. Score 20 or above:  warrants further diagnostic evaluation for alcohol dependence and treatment.   CLINICAL FACTORS:   Schizophrenia:   Paranoid or undifferentiated type   Musculoskeletal: Strength & Muscle Tone: within normal limits Gait & Station: unsteady Patient leans: N/A  Psychiatric Specialty Exam: Physical Exam  Nursing note and vitals reviewed. Constitutional: He appears well-developed and well-nourished.  HENT:  Head: Normocephalic and atraumatic.  Eyes: Pupils are equal, round, and reactive to light. Conjunctivae are normal.  Cardiovascular: Regular rhythm and normal heart sounds.   Respiratory: Effort normal. No respiratory distress.  GI: Soft.  Musculoskeletal:        General: Normal range of motion.     Cervical back: Normal range of motion.  Neurological: He is alert.  Skin: Skin is warm and dry.  Psychiatric: His affect is blunt. His speech is delayed and tangential. He is agitated. He is not aggressive and not hyperactive. Thought content is paranoid and delusional. Cognition and memory are impaired. He expresses inappropriate judgment. He expresses no homicidal and no suicidal ideation.    Review of Systems  Constitutional: Negative.   HENT: Negative.   Eyes: Negative.   Respiratory: Negative.   Cardiovascular: Negative.   Gastrointestinal: Negative.   Musculoskeletal: Negative.   Skin: Negative.   Neurological: Negative.   Psychiatric/Behavioral: Positive for behavioral problems and confusion.    Blood pressure 125/84, pulse 67, temperature 98.5 F (36.9 C), temperature source Oral, resp. rate 17, height 5\' 8"  (1.727 m), weight 83.9 kg, SpO2 100 %.Body mass index is 28.12 kg/m.  General Appearance: Disheveled  Eye Contact:  Minimal  Speech:  Garbled and Slow  Volume:  Decreased  Mood:  Dysphoric and Irritable  Affect:  Constricted  Thought Process:  Disorganized  Orientation:  Negative  Thought Content:  Illogical, Delusions, Rumination and Tangential  Suicidal Thoughts:  No  Homicidal Thoughts:  No  Memory:  Immediate;   Fair Recent;   Poor Remote;   Poor  Judgement:  Impaired  Insight:  Shallow  Psychomotor Activity:  Decreased and TD  Concentration:  Concentration: Poor  Recall:  Poor  Fund of Knowledge:  Poor  Language:  Poor  Akathisia:  No  Handed:  Right  AIMS (if indicated):  Assets:  Desire for Improvement Housing  ADL's:  Impaired  Cognition:  Impaired,  Mild and Moderate  Sleep:  Number of Hours: 5.5      COGNITIVE FEATURES THAT CONTRIBUTE TO RISK:  Thought constriction (tunnel vision)    SUICIDE RISK:    Minimal: No identifiable suicidal ideation.  Patients presenting with no risk factors but with morbid ruminations; may be classified as minimal risk based on the severity of the depressive symptoms  PLAN OF CARE: Patient appears to have decompensated and be more disorganized and irritable.  Restarting medication.  He is now on long-acting Invega shots.  Daily reexamination of dangerousness before arranging for discharge plans with his act team.  I certify that inpatient services furnished can reasonably be expected to improve the patient's condition.   Alethia Berthold, MD 05/18/2019, 2:39 PM

## 2019-05-18 NOTE — Progress Notes (Signed)
Recreation Therapy Notes  Date: 05/18/2019  Time: 9:30 am   Location: Craft room   Behavioral response: N/A   Intervention Topic: Goals  Discussion/Intervention: Patient did not attend group.   Clinical Observations/Feedback:  Patient did not attend group.   Lynisha Osuch LRT/CTRS         Shane Nash 05/18/2019 12:11 PM

## 2019-05-18 NOTE — Tx Team (Addendum)
Interdisciplinary Treatment and Diagnostic Plan Update  05/18/2019 Time of Session: 9:00AM Shane Nash MRN: [id number]  Principal Diagnosis: <principal problem not specified>  Secondary Diagnoses: Active Problems:   Disorganized behavior   Current Medications:  Current Facility-Administered Medications  Medication Dose Route Frequency Provider Last Rate Last Admin  . acetaminophen (TYLENOL) tablet 650 mg  650 mg Oral Q6H PRN Dixon, Rashaun M, NP      . alum & mag hydroxide-simeth (MAALOX/MYLANTA) 200-200-20 MG/5ML suspension 30 mL  30 mL Oral Q4H PRN Dixon, Rashaun M, NP      . hydrOXYzine (ATARAX/VISTARIL) tablet 25 mg  25 mg Oral TID PRN Deloria Lair, NP      . magnesium hydroxide (MILK OF MAGNESIA) suspension 30 mL  30 mL Oral Daily PRN Dixon, Rashaun M, NP      . traZODone (DESYREL) tablet 50 mg  50 mg Oral QHS PRN Deloria Lair, NP       PTA Medications: Medications Prior to Admission  Medication Sig Dispense Refill Last Dose  . benztropine (COGENTIN) 2 MG tablet Take 1 tablet (2 mg total) by mouth daily. (Patient taking differently: Take 2 mg by mouth at bedtime. ) 30 tablet 1   . diphenhydrAMINE (BENADRYL) 50 MG capsule Take 1 capsule (50 mg total) by mouth at bedtime. 30 capsule 1   . INVEGA SUSTENNA 234 MG/1.5ML SUSY injection Inject 234 mg into the muscle every 30 (thirty) days.       Patient Stressors: Marital or family conflict Medication change or noncompliance  Patient Strengths: Motivation for treatment/growth Supportive family/friends  Treatment Modalities: Medication Management, Group therapy, Case management,  1 to 1 session with clinician, Psychoeducation, Recreational therapy.   Physician Treatment Plan for Primary Diagnosis: <principal problem not specified> Long Term Goal(s):     Short Term Goals:    Medication Management: Evaluate patient's response, side effects, and tolerance of medication regimen.  Therapeutic Interventions: 1 to 1  sessions, Unit Group sessions and Medication administration.  Evaluation of Outcomes: Not Met  Physician Treatment Plan for Secondary Diagnosis: Active Problems:   Disorganized behavior  Long Term Goal(s):     Short Term Goals:       Medication Management: Evaluate patient's response, side effects, and tolerance of medication regimen.  Therapeutic Interventions: 1 to 1 sessions, Unit Group sessions and Medication administration.  Evaluation of Outcomes: Not Met   RN Treatment Plan for Primary Diagnosis: <principal problem not specified> Long Term Goal(s): Knowledge of disease and therapeutic regimen to maintain health will improve  Short Term Goals: Ability to demonstrate self-control, Ability to participate in decision making will improve, Ability to verbalize feelings will improve, Ability to identify and develop effective coping behaviors will improve and Compliance with prescribed medications will improve  Medication Management: RN will administer medications as ordered by provider, will assess and evaluate patient's response and provide education to patient for prescribed medication. RN will report any adverse and/or side effects to prescribing provider.  Therapeutic Interventions: 1 on 1 counseling sessions, Psychoeducation, Medication administration, Evaluate responses to treatment, Monitor vital signs and CBGs as ordered, Perform/monitor CIWA, COWS, AIMS and Fall Risk screenings as ordered, Perform wound care treatments as ordered.  Evaluation of Outcomes: Not Met   LCSW Treatment Plan for Primary Diagnosis: <principal problem not specified> Long Term Goal(s): Safe transition to appropriate next level of care at discharge, Engage patient in therapeutic group addressing interpersonal concerns.  Short Term Goals: Engage patient in aftercare planning with referrals and  resources, Increase social support, Increase ability to appropriately verbalize feelings, Increase emotional  regulation, Facilitate acceptance of mental health diagnosis and concerns and Increase skills for wellness and recovery  Therapeutic Interventions: Assess for all discharge needs, 1 to 1 time with Social worker, Explore available resources and support systems, Assess for adequacy in community support network, Educate family and significant other(s) on suicide prevention, Complete Psychosocial Assessment, Interpersonal group therapy.  Evaluation of Outcomes: Not Met   Progress in Treatment: Attending groups: Yes. Participating in groups: Yes. Taking medication as prescribed: Yes. Toleration medication: Yes. Family/Significant other contact made: No, will contact:  once permission is given Patient understands diagnosis: No. Discussing patient identified problems/goals with staff: Yes. Medical problems stabilized or resolved: Yes. Denies suicidal/homicidal ideation: Yes. Issues/concerns per patient self-inventory: No. Other: none  New problem(s) identified: No, Describe:  none  New Short Term/Long Term Goal(s): elimination of symptoms of psychosis, medication management for mood stabilization; development of comprehensive mental wellness plan.  Patient Goals:  "get my own place"  Discharge Plan or Barriers: [place] reports plans to return to the home with his nephew and work on getting his own home. Patient further reports plans to continue with his ACT team.  Reason for Continuation of Hospitalization: Hallucinations Medical Issues Medication stabilization  Estimated Length of Stay:  1-7 dasys  Recreational Therapy: Patient Stressors: N/A Patient Goal: Patient will engage in groups without prompting or encouragement from LRT x3 group sessions within 5 recreation therapy group sessions  Attendees: Patient: Shane Nash 05/18/2019 10:57 AM  Physician: Dr. Weber Cooks, MD 05/18/2019 10:57 AM  Nursing: Lyda Kalata, RN 05/18/2019 10:57 AM  RN Care Manager: 05/18/2019 10:57 AM   Social Worker: Assunta Curtis, LCSW 05/18/2019 10:57 AM  Recreational Therapist: Devin Going, LRT 05/18/2019 10:57 AM  Other: Sanjuana Kava, LCSW 05/18/2019 10:57 AM  Other:  05/18/2019 10:57 AM  Other: 05/18/2019 10:57 AM    Scribe for Treatment Team: Rozann Lesches, LCSW 05/18/2019 10:57 AM

## 2019-05-18 NOTE — [facility] Group Notes (Signed)
[facility] Group Notes:  (Nursing/MHT/Case Management/Adjunct)  Date:  05/18/2019  Time:  9:51 PM  Type of Therapy:  Group Therapy  Participation Level:  Did Not Attend    Landry Mellow 05/18/2019, 9:51 PM

## 2019-05-18 NOTE — [facility] Suicide Risk Assessment (Signed)
[facility] INPATIENT:  Family/Significant Other Suicide Prevention Education  Suicide Prevention Education:  Contact Attempts: Filomena Jungling, nephew, [phone] has been identified by the patient as the family member/significant other with whom the patient will be residing, and identified as the person(s) who will aid the patient in the event of a mental health crisis.  With written consent from the patient, two attempts were made to provide suicide prevention education, prior to and/or following the patient's discharge.  We were unsuccessful in providing suicide prevention education.  A suicide education pamphlet was given to the patient to share with family/significant other.  Date and time of first attempt: 05/18/2019 10:56AM Date and time of second attempt: Second attempt is needed  CSW notes unable to leave voicemail as the voicemail box had not been set up.    [name] 05/18/2019, 10:56 AM

## 2019-05-18 NOTE — Plan of Care (Signed)
D- Patient alert and oriented to self. Patient presented in a pleasant mood on assessment stating that he slept good last night and had no complaints to voice to this Clinical research associate. Patient is disorganized with his thoughts, however, he does follow commands and answers questions to the best of his ability. Patient denied SI, HI, AVH, and pain at the time of questioning, stating "naw, I'm just living life". Patient also denied any signs/symptoms of depression and anxiety stating that "I'm feeling good, ready to get my room so I can listen to music". Patient stated goals, per treatment team, is to "get my own house and stuff, my own job and stuff. I'm tired of playing with my family".  A- Scheduled medications administered to patient, per MD orders. Support and encouragement provided.  Routine safety checks conducted every 15 minutes.  Patient informed to notify staff with problems or concerns.  R- No adverse drug reactions noted. Patient contracts for safety at this time. Patient compliant with medications and treatment plan. Patient receptive, calm, and cooperative. Patient interacts well with others on the unit.  Patient remains safe at this time.  Problem: Education: Goal: Knowledge of [facility] Education information/materials will improve Outcome: Not Progressing Goal: Emotional status will improve Outcome: Not Progressing Goal: Mental status will improve Outcome: Not Progressing Goal: Verbalization of understanding the information provided will improve Outcome: Not Progressing   Problem: Safety: Goal: Periods of time without injury will increase Outcome: Not Progressing   Problem: Activity: Goal: Will verbalize the importance of balancing activity with adequate rest periods Outcome: Not Progressing   Problem: Safety: Goal: Ability to redirect hostility and anger into socially appropriate behaviors will improve Outcome: Not Progressing Goal: Ability to remain free from injury will  improve Outcome: Not Progressing

## 2019-05-18 NOTE — H&P (Signed)
Psychiatric Admission Assessment Adult  Patient Identification: Shane Nash MRN:  [id number] Date of Evaluation:  05/18/2019 Chief Complaint:  Disorganized behavior [R46.89] Principal Diagnosis: Schizophrenia (HCC) Diagnosis:  Principal Problem:   Schizophrenia (HCC) Active Problems:   Polydipsia   Disorganized behavior   Tardive dyskinesia  History of Present Illness: Patient seen and chart reviewed.  39 year old man with a history of schizophrenia.  Brought to the emergency room by his act team because of bizarre decompensated behavior.  Patient has been in the emergency room for several days now.  Initially seemed to be quite disorganized and confused saving his urine and feces and containers not really making any sense.  He has been given some medicine over the last couple days and has improved somewhat but still remains very disorganized.  In interview today the patient makes minimal eye contact.  He seems preoccupied by his hand movements.  He talks about wanting to get his own apartment to live in.  He acknowledges that he is never had one before.  He cannot articulate any particular reason why he would want to been doing that now.  Patient does not answer questions about medicine compliance.  Denies suicidal or homicidal thoughts.  Not clear why he has decompensated as it appears that he is on long-acting injectable medicine as his primary drugs and that he may have been compliant with those although we have been given a sheet that says that his Hinda Glatter shot was due on the 13th but his act team act team reported that he needed his shot today.  No alcohol or drug abuse.  No known recent trauma. 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  Has been seen several times in the past at our hospital.  In the past he had a history of doing pretty well when he was on long-acting antipsychotics and Depakote.  Depakote was discontinued last year out of concern that it was contributing to his hyponatremia.  He has a history of hyponatremia that seems to be mostly related to water intoxication.  It does not seem that this has gone away since being off of the Depakote.  Is the patient at risk to self? No.  Has the patient been a risk to self in the past 6 months? No.  Has the patient been a risk to self within the distant past? No.  Is the patient a risk to others? Yes.    Has the patient been a risk to others in the past 6 months? Yes.    Has the patient been a risk to others within the distant past? Yes.     Prior Inpatient Therapy:   Prior Outpatient Therapy:    Alcohol Screening: 1. How often do you have a drink containing alcohol?: Never 2. How many drinks containing alcohol do you have on a typical day when you are drinking?: 1 or 2 3. How often do you have six or more drinks on one occasion?: Never AUDIT-C Score: 0 4. How often during the last year have you found that you were not able to stop drinking once you had started?: Never 5. How often during the last year have you failed to do what was normally expected from  you becasue of drinking?: Never 6. How often during the last year have you needed a first drink in the morning to get yourself going after a heavy drinking session?: Never 7. How often during the last year have you had a feeling of guilt of remorse after drinking?: Never 8. How often during the last year have you been unable to remember what happened the night before because you had been drinking?: Never 9. Have you or someone else been injured as a result of your drinking?: No 10.  Has a relative or friend or a doctor or another health worker been concerned about your drinking or suggested you cut down?: No Alcohol Use Disorder Identification Test Final Score (AUDIT): 0 Alcohol Brief Interventions/Follow-up: AUDIT Score <7 follow-up not indicated Substance Abuse History in the last 12 months:  No. Consequences of Substance Abuse: Negative Previous Psychotropic Medications: Yes  Psychological Evaluations: Yes  Past Medical History:  Past Medical History:  Diagnosis Date  . Schizophrenia ([place])    History reviewed. No pertinent surgical history. Family History: History reviewed. No pertinent family history. Family Psychiatric  History: Patient unable to give any history Tobacco Screening: Have you used any form of tobacco in the last 30 days? (Cigarettes, Smokeless Tobacco, Cigars, and/or Pipes): Yes Tobacco use, Select all that apply: 5 or more cigarettes per day Are you interested in Tobacco Cessation Medications?: No, patient refused Counseled patient on smoking cessation including recognizing danger situations, developing coping skills and basic information about quitting provided: Refused/Declined practical counseling Social History:  Social History   Substance and Sexual Activity  Alcohol Use None     Social History   Substance and Sexual Activity  Drug Use No    Additional Social History:                           Allergies:  No Known Allergies Lab Results: No results found for this or any previous visit (from the past 48 hour(s)).  Blood Alcohol level:  Lab Results  Component Value Date   ETH <10 05/15/2019   ETH <10 60/45/4098    Metabolic Disorder Labs:  Lab Results  Component Value Date   HGBA1C 5.2 03/27/2018   MPG 102.54 03/27/2018   MPG 102.54 01/31/2017   No results found for: PROLACTIN Lab Results  Component Value Date   CHOL 193 03/27/2018   TRIG 154 (H) 03/27/2018   HDL 51 03/27/2018   CHOLHDL 3.8 03/27/2018    VLDL 31 03/27/2018   LDLCALC 111 (H) 03/27/2018   LDLCALC 102 (H) 01/31/2017    Current Medications: Current Facility-Administered Medications  Medication Dose Route Frequency Provider Last Rate Last Admin  . acetaminophen (TYLENOL) tablet 650 mg  650 mg Oral Q6H PRN Dixon, Rashaun M, NP      . alum & mag hydroxide-simeth (MAALOX/MYLANTA) 200-200-20 MG/5ML suspension 30 mL  30 mL Oral Q4H PRN Dixon, Rashaun M, NP      . benztropine (COGENTIN) tablet 1 mg  1 mg Oral BID Deric Bocock T, MD      . divalproex (DEPAKOTE ER) 24 hr tablet 500 mg  500 mg Oral QHS Gilmar Bua T, MD      . hydrOXYzine (ATARAX/VISTARIL) tablet 50 mg  50 mg Oral Q6H PRN Harlem Thresher T, MD      . magnesium hydroxide (MILK OF MAGNESIA) suspension 30 mL  30 mL Oral Daily PRN Deloria Lair, NP      .  paliperidone (INVEGA SUSTENNA) injection 234 mg  234 mg Intramuscular Q30 days Jazaria Jarecki T, MD      . traZODone (DESYREL) tablet 50 mg  50 mg Oral QHS PRN Jearld Lesch, NP       PTA Medications: Medications Prior to Admission  Medication Sig Dispense Refill Last Dose  . benztropine (COGENTIN) 2 MG tablet Take 1 tablet (2 mg total) by mouth daily. (Patient taking differently: Take 2 mg by mouth at bedtime. ) 30 tablet 1   . diphenhydrAMINE (BENADRYL) 50 MG capsule Take 1 capsule (50 mg total) by mouth at bedtime. 30 capsule 1   . INVEGA SUSTENNA 234 MG/1.5ML SUSY injection Inject 234 mg into the muscle every 30 (thirty) days.       Musculoskeletal: Strength & Muscle Tone: within normal limits Gait & Station: normal Patient leans: N/A  Psychiatric Specialty Exam: Physical Exam  Nursing note and vitals reviewed. Constitutional: He appears well-developed and well-nourished.  HENT:  Head: Normocephalic and atraumatic.  Eyes: Pupils are equal, round, and reactive to light. Conjunctivae are normal.  Cardiovascular: Regular rhythm and normal heart sounds.  Respiratory: Effort normal. No respiratory distress.   GI: Soft.  Musculoskeletal:        General: Normal range of motion.     Cervical back: Normal range of motion.  Neurological: He is alert.  Skin: Skin is warm and dry.  Psychiatric: His affect is blunt and inappropriate. His speech is tangential and slurred. He is agitated. He is not aggressive. Thought content is paranoid and delusional. Cognition and memory are impaired. He expresses impulsivity and inappropriate judgment. He expresses no homicidal and no suicidal ideation. He exhibits abnormal recent memory.    Review of Systems  Constitutional: Negative.   HENT: Negative.   Eyes: Negative.   Respiratory: Negative.   Cardiovascular: Negative.   Gastrointestinal: Negative.   Musculoskeletal: Negative.   Skin: Negative.   Neurological: Positive for tremors.  Psychiatric/Behavioral: Positive for agitation and confusion.    Blood pressure 125/84, pulse 67, temperature 98.5 F (36.9 C), temperature source Oral, resp. rate 17, height 5\' 8"  (1.727 m), weight 83.9 kg, SpO2 100 %.Body mass index is 28.12 kg/m.  General Appearance: Disheveled  Eye Contact:  Minimal  Speech:  Slow and Slurred  Volume:  Decreased  Mood:  Anxious and Dysphoric  Affect:  Constricted  Thought Process:  Disorganized  Orientation:  Negative  Thought Content:  Illogical, Paranoid Ideation and Rumination  Suicidal Thoughts:  No  Homicidal Thoughts:  No  Memory:  Immediate;   Fair Recent;   Poor Remote;   Poor  Judgement:  Impaired  Insight:  Shallow  Psychomotor Activity:  Decreased  Concentration:  Concentration: Poor  Recall:  Poor  Fund of Knowledge:  Fair  Language:  Fair  Akathisia:  No  Handed:  Right  AIMS (if indicated):     Assets:  Desire for Improvement  ADL's:  Impaired  Cognition:  Impaired,  Mild  Sleep:  Number of Hours: 5.5    Treatment Plan Summary: Daily contact with patient to assess and evaluate symptoms and progress in treatment, Medication management and Plan 39 year old  man with schizophrenia.  He seems to be just disorganized today.  Talks repeatedly about wanting to have his own place to live but has not been agitated or threatening.  He does seem constantly preoccupied and it is hard to engage him another conversation that at least he is not acting out aggressively.  Based on  his previous history I will go ahead and give him a 234 mg [place] shot today and we will also restart Depakote at the modest dose of 500 mg at night.  On reflection on his long history I do not think the Depakote was playing a role in his history of hyponatremia given that we know that he water intoxicate's.  Continue daily group involvement.  Work with treatment team and act team and group home about discharge planning  Observation Level/Precautions:  15 minute checks  Laboratory:  Chemistry Profile  Psychotherapy:    Medications:    Consultations:    Discharge Concerns:    Estimated LOS:  Other:     Physician Treatment Plan for Primary Diagnosis: Schizophrenia (HCC) Long Term Goal(s): Improvement in symptoms so as ready for discharge  Short Term Goals: Ability to verbalize feelings will improve and Ability to demonstrate self-control will improve  Physician Treatment Plan for Secondary Diagnosis: Principal Problem:   Schizophrenia (HCC) Active Problems:   Polydipsia   Disorganized behavior   Tardive dyskinesia  Long Term Goal(s): Improvement in symptoms so as ready for discharge  Short Term Goals: Ability to maintain clinical measurements within normal limits will improve and Compliance with prescribed medications will improve  I certify that inpatient services furnished can reasonably be expected to improve the patient's condition.    Mordecai Rasmussen, MD 4/20/20212:42 PM

## 2019-05-18 NOTE — Progress Notes (Signed)
   05/18/19 1400  Clinical Encounter Type  Visited With Patient;Other (Comment)  Visit Type Initial;Spiritual support;Behavioral Health  Referral From Chaplain  Consult/Referral To Chaplain  Patient went outside but did not participate in group.

## 2019-05-18 NOTE — [facility] Counselor (Signed)
Adult Comprehensive Assessment  Patient ID: Shane Nash, male   DOB: [birth year], 39 y.o.   MRN: [id number]  Information Source: Information source: Patient   Current Stressors:  Patient states their primary concerns and needs for treatment are:: Pt reports "I don't know".   Patient states their goals for this hospitilization and ongoing recovery are:: Pt reports "get my own house".    Living/Environment/Situation:  Living Arrangements: Other relatives Living conditions (as described by patient or guardian): Pt reports that he lives with his nephew.  Who else lives in the home?: Nephew How long has patient lived in current situation?: Pt reports "couple months".  What is atmosphere in current home: Comfortable   Family History:  Marital status: Single Are you sexually active?: No What is your sexual orientation?: Heterosexual  Has your sexual activity been affected by drugs, alcohol, medication, or emotional stress?: Pt denies. Does patient have children?: No   Childhood History:  By whom was/is the patient raised?: Both parents Description of patient's relationship with caregiver when they were a child: Pt reports "it was all right".   Patient's description of current relationship with people who raised him/her: Pt reports "it's still all right".   How were you disciplined when you got in trouble as a child/adolescent?: Pt reports "I stuck my nose in the corner".  Does patient have siblings?: Yes Number of Siblings: 5 Description of patient's current relationship with siblings: Pt reports that he has quite a few siblings and he is not sure how many exactly, however, reports that he has a good relationship. Did patient suffer any verbal/emotional/physical/sexual abuse as a child?: No Did patient suffer from severe childhood neglect?: No Has patient ever been sexually abused/assaulted/raped as an adolescent or adult?: No Was the patient ever a victim of a crime or a disaster?:  No Witnessed domestic violence?: No Has patient been effected by domestic violence as an adult?: No   Education:  Highest grade of school patient has completed: 9th Currently a student?: No Learning disability?: No   Employment/Work Situation:   Employment situation: On disability Why is patient on disability: Pt reports that he is not sure.  How long has patient been on disability: Pt reports "years". Did You Receive Any Psychiatric Treatment/Services While in the Military?: No Are There Guns or Other Weapons in Your Home?: No   Financial Resources:   Financial resources: [employer], Support from parents / caregiver, Medicaid Does patient have a Lawyer or guardian?: No   Alcohol/Substance Abuse:   What has been your use of drugs/alcohol within the last 12 months?: Pt denied.  Alcohol/Substance Abuse Treatment Hx: Denies past history   Social Support System:   Patient's Community Support System: Good Describe Community Support System: Pt reports "mom". Type of faith/religion: Pt reports he is religious. How does patient's faith help to cope with current illness?: Pt reports "I believe in God, that's it."   Leisure/Recreation:   Leisure and Hobbies: Pt reports "TV and shoot ball"   Strengths/Needs:   What is the patient's perception of their strengths?: Pt reports "I don't know." Patient states these barriers may affect/interfere with their treatment: Pt denies.  Patient states these barriers may affect their return to the community: Pt denies.   Discharge Plan:   Currently receiving community mental health services: Yes (From Whom)([facility] ACTT team) Patient states concerns and preferences for aftercare planning are: Pt has ACTT team and wishes to continue with them. Patient states they will know when  they are safe and ready for discharge when: Pt reports "I know I'm safe when my money come I'll act right". Does patient have access to transportation?: Yes Does  patient have financial barriers related to discharge medications?: No Summary/Recommendations:   Summary and Recommendations (to be completed by the evaluator): Patient is a 39 year old single male living in [place], [place] [facility]  Pt reports that he is currently on disability.  Patient also reports that he has Medicaid.  He presents to the hospital with the auditory hallucinations and suicidal ideations.  He has a primary diagnosis of Schizophrenia.  Recommendations for patient include crisis stabilization, therapeutic milieu, encourage group attendance and participation, medication management for detox/mood stabilization and development of comprehensive mental wellness/sobriety plan.  Rozann Lesches. 05/18/2019

## 2019-05-18 NOTE — [facility] Group Notes (Signed)
LCSW Group Therapy Note  05/18/2019 1:00 PM  Type of Therapy/Topic:  Group Therapy:  Feelings about Diagnosis  Participation Level:  None   Description of Group:   This group will allow patients to explore their thoughts and feelings about diagnoses they have received. Patients will be guided to explore their level of understanding and acceptance of these diagnoses. Facilitator will encourage patients to process their thoughts and feelings about the reactions of others to their diagnosis and will guide patients in identifying ways to discuss their diagnosis with significant others in their lives. This group will be process-oriented, with patients participating in exploration of their own experiences, giving and receiving support, and processing challenge from other group members.   Therapeutic Goals: 1. Patient will demonstrate understanding of diagnosis as evidenced by identifying two or more symptoms of the disorder 2. Patient will be able to express two feelings regarding the diagnosis 3. Patient will demonstrate their ability to communicate their needs through discussion and/or role play  Summary of Patient Progress: Patient was present however did not engage in group discussion.   Therapeutic Modalities:   Cognitive Behavioral Therapy Brief Therapy Feelings Identification   Penni Homans, MSW, LCSW 05/18/2019 2:27 PM

## 2019-05-19 NOTE — Plan of Care (Signed)
Pt rates depression and hopelessness both 9/10 and anxiety 1/10. Pt denies SI, Hi and VH but has "voices in my ear". Pt was educated on care plan and verbalizes understanding. Pt was encouraged to attend groups. Torrie Mayers RN Problem: Education: Goal: Knowledge of [facility] Education information/materials will improve Outcome: Progressing Goal: Emotional status will improve Outcome: Progressing Goal: Mental status will improve Outcome: Progressing Goal: Verbalization of understanding the information provided will improve Outcome: Progressing   Problem: Safety: Goal: Periods of time without injury will increase Outcome: Progressing   Problem: Activity: Goal: Will verbalize the importance of balancing activity with adequate rest periods Outcome: Progressing   Problem: Safety: Goal: Ability to redirect hostility and anger into socially appropriate behaviors will improve Outcome: Progressing Goal: Ability to remain free from injury will improve Outcome: Progressing

## 2019-05-19 NOTE — Progress Notes (Signed)
Patient oriented to self only. Confusion noted. Denies SI, HI, AVH. Sleeping eyes closed in no distress. Pt remains safe with q 15 min checks.

## 2019-05-19 NOTE — [facility] Counselor (Signed)
CSW attempted to reachthe patient's sister, Wallie Lagrand, [phone].  Neither number was working.   Penni Homans, MSW, LCSW 05/19/2019 2:56 PM

## 2019-05-19 NOTE — Plan of Care (Signed)
  Problem: Safety: Goal: Periods of time without injury will increase Outcome: Progressing   Problem: Safety: Goal: Ability to redirect hostility and anger into socially appropriate behaviors will improve Outcome: Progressing

## 2019-05-19 NOTE — Progress Notes (Signed)
Recreation Therapy Notes   Date: 05/19/2019  Time: 9:30 am   Location: Craft room   Behavioral response: N/A   Intervention Topic: Happiness   Discussion/Intervention: Patient did not attend group.   Clinical Observations/Feedback:  Patient did not attend group.   Lyndie Vanderloop LRT/CTRS        Azile Minardi 05/19/2019 11:30 AM

## 2019-05-19 NOTE — Progress Notes (Signed)
Pt is difficult to assess. His words are disorganized. When asked if he was still "hearing voices" he said "no just in my ear". Pt dd not attend groups or go outside. Pt sometimes watches tv in dayroom. Torrie Mayers RN

## 2019-05-19 NOTE — [facility] Suicide Risk Assessment (Signed)
[facility] INPATIENT:  Family/Significant Other Suicide Prevention Education  Suicide Prevention Education:  Contact Attempts: Shane Nash, nephew, [phone] and Shane Nash, mother, [phone], has been identified by the patient as the family member/significant other with whom the patient will be residing, and identified as the person(s) who will aid the patient in the event of a mental health crisis.  With written consent from the patient, two attempts were made to provide suicide prevention education, prior to and/or following the patient's discharge.  We were unsuccessful in providing suicide prevention education.  A suicide education pamphlet was given to the patient to share with family/significant other.  Date and time of first attempt4/20/2021 10:56AM Date and time of second attempt: 05/19/2019 10:23AM  CSW received message with both calls that calls could not be completed as dialed and attempts should be made later.  [name] 05/19/2019, 10:23 AM

## 2019-05-19 NOTE — Progress Notes (Signed)
[facility] MD Progress Note  05/19/2019 10:01 AM Shane Nash  MRN:  [id number]   Subjective: Follow-up for this 39 year old male patient diagnosed with schizophrenia.  Patient reports that the only reason he is at the hospital was because his back pain is playing games with him.  He states that he was taking his medications and he does not need to be here.  He states that his plan is to get his own place so that he can tell them to leave him alone.  Attempted to inform patient that he still needs to continue his medications and then patient gave me a blank stare.  Patient continues to deny any suicidal homicidal ideations and denies any hallucinations.  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  Has been seen several times in the past at our hospital.  In the past he had a history of doing pretty well when he was on long-acting antipsychotics and Depakote.  Depakote was discontinued last year out of concern that it was contributing to his hyponatremia.  He has a history of hyponatremia that seems to be mostly related to water intoxication.  It does not seem that this has gone away since being off of the Depakote.  Past Medical History:  Past Medical History:  Diagnosis Date  . Schizophrenia (HCC)    History reviewed. No pertinent surgical history. Family History: History reviewed. No pertinent family history. Family Psychiatric  History: None reported Social History:  Social History   Substance and Sexual Activity  Alcohol Use None     Social History   Substance and Sexual Activity  Drug Use No    Social History    Socioeconomic History  . Marital status: Single    Spouse name: Not on file  . Number of children: Not on file  . Years of education: Not on file  . Highest education level: Not on file  Occupational History  . Not on file  Tobacco Use  . Smoking status: Current Every Day Smoker    Packs/day: 0.25    Years: 15.00    Pack years: 3.75    Types: Cigarettes  . Smokeless tobacco: Never Used  . Tobacco comment: Cannot state the amount he smokes per day  Substance and Sexual Activity  . Alcohol use: Not on file  . Drug use: No  . Sexual activity: Not Currently  Other Topics Concern  . Not on file  Social History Narrative  . Not on file   Social Determinants of Health   Financial Resource Strain:   . Difficulty of Paying Living Expenses:   Food Insecurity:   . Worried About Programme researcher, broadcasting/film/video in the Last Year:   . Barista in the Last Year:   Transportation Needs:   . Freight forwarder (Medical):   Marland Kitchen Lack of Transportation (Non-Medical):   Physical Activity:   . Days of Exercise per Week:   . Minutes of Exercise per Session:   Stress:   . Feeling of Stress :   Social Connections:   . Frequency of Communication with Friends and Family:   . Frequency of Social Gatherings with  Friends and Family:   . Attends Religious Services:   . Active Member of Clubs or Organizations:   . Attends Banker Meetings:   Marland Kitchen Marital Status:    Additional Social History:                         Sleep: Good  Appetite:  Good  Current Medications: Current Facility-Administered Medications  Medication Dose Route Frequency Provider Last Rate Last Admin  . acetaminophen (TYLENOL) tablet 650 mg  650 mg Oral Q6H PRN Dixon, Rashaun M, NP      . alum & mag hydroxide-simeth (MAALOX/MYLANTA) 200-200-20 MG/5ML suspension 30 mL  30 mL Oral Q4H PRN Dixon, Rashaun M, NP      . benztropine (COGENTIN) tablet 1 mg  1 mg Oral BID [name], [name], MD   1 mg at 05/19/19  0747  . divalproex (DEPAKOTE ER) 24 hr tablet 500 mg  500 mg Oral QHS [name], [name], MD   500 mg at 05/18/19 2148  . hydrOXYzine (ATARAX/VISTARIL) tablet 50 mg  50 mg Oral Q6H PRN [name], [name], MD      . magnesium hydroxide (MILK OF MAGNESIA) suspension 30 mL  30 mL Oral Daily PRN Dixon, Rashaun M, NP      . paliperidone (INVEGA SUSTENNA) injection 234 mg  234 mg Intramuscular Q30 days [name], [name], MD   234 mg at 05/18/19 1743  . traZODone (DESYREL) tablet 50 mg  50 mg Oral QHS PRN Jearld Lesch, NP        Lab Results: No results found for this or any previous visit (from the past 48 hour(s)).  Blood Alcohol level:  Lab Results  Component Value Date   ETH <10 05/15/2019   ETH <10 05/07/2019    Metabolic Disorder Labs: Lab Results  Component Value Date   HGBA1C 5.2 03/27/2018   MPG 102.54 03/27/2018   MPG 102.54 01/31/2017   No results found for: PROLACTIN Lab Results  Component Value Date   CHOL 193 03/27/2018   TRIG 154 (H) 03/27/2018   HDL 51 03/27/2018   CHOLHDL 3.8 03/27/2018   VLDL 31 03/27/2018   LDLCALC 111 (H) 03/27/2018   LDLCALC 102 (H) 01/31/2017    Physical Findings: AIMS:  , ,  ,  ,    CIWA:    COWS:     Musculoskeletal: Strength & Muscle Tone: within normal limits Gait & Station: normal Patient leans: N/A  Psychiatric Specialty Exam: Physical Exam  Nursing note and vitals reviewed. Constitutional: He is oriented to person, place, and time. He appears well-developed and well-nourished.  Respiratory: Effort normal.  Musculoskeletal:        General: Normal range of motion.  Neurological: He is alert and oriented to person, place, and time.  Skin: Skin is warm.  Psychiatric: His affect is blunt. His speech is tangential. He is agitated and withdrawn. He expresses inappropriate judgment.    Review of Systems  Constitutional: Negative.   HENT: Negative.   Eyes: Negative.   Respiratory: Negative.   Cardiovascular: Negative.    Gastrointestinal: Negative.   Genitourinary: Negative.   Musculoskeletal: Negative.   Skin: Negative.   Neurological: Negative.   Psychiatric/Behavioral: Negative.     Blood pressure 120/76, pulse (!) 47, temperature 98.6 F (37 C), temperature source Oral, resp. rate 17, height 5\' 8"  (1.727 m), weight 83.9 kg, SpO2 100 %.Body mass index is 28.12 kg/m.  General Appearance: Casual  and Guarded  Eye Contact:  Fair  Speech:  Pressured  Volume:  Decreased  Mood:  Irritable  Affect:  Constricted  Thought Process:  Disorganized, Goal Directed and Descriptions of Associations: Tangential  Orientation:  Full (Time, Place, and Person)  Thought Content:  Illogical, Rumination and Tangential  Suicidal Thoughts:  No  Homicidal Thoughts:  No  Memory:  Immediate;   Fair Recent;   Fair Remote;   Fair  Judgement:  Impaired  Insight:  Lacking  Psychomotor Activity:  Mannerisms and Restlessness  Concentration:  Concentration: Fair  Recall:  [employer] of Knowledge:  Fair  Language:  Fair  Akathisia:  No  Handed:  Right  AIMS (if indicated):     Assets:  Financial Resources/Insurance Housing Social Support  ADL's:  Intact  Cognition:  WNL  Sleep:  Number of Hours: 8   Assessment: Patient presents in his room and is walking around.  Patient appears to be irritable when discussing why he is at the hospital.  Patient continues with fixation on his hands and mannerisms of rubbing his fingers together.  Patient has fair eye contact.  He continues to remain focused on discharge and in getting his own place so that he can be left alone.  Patient does not comprehend the fact that he needs to continue his medications at this time.  Feel that the patient is not stable at this time for any type of appropriate discharge needs to be kept in the hospital to remain on medications for continued medication and treatment management.  Attempted to contact patient's nephew, Marylyn Ishihara, again and was unable to leave a  voicemail and no one answered the phone.  Patient has not been attending groups or interacting with peers or staff appropriately as of yet.  Treatment Plan Summary: Daily contact with patient to assess and evaluate symptoms and progress in treatment and Medication management Continue Cogentin 1 mg p.o. twice daily for EPS Continue Depakote ER 500 mg p.o. nightly for schizophrenia Continue Vistaril 50 mg p.o. every 6 hours as needed for anxiety Continue Invega Sustenna 234 mg IM q. 30 days with last dose on 05/18/2019 Continue trazodone 50 mg p.o. nightly.  For insomnia Encourage group therapy participation Continue every 15 minute safety checks  Lewis Shock, FNP 05/19/2019, 10:01 AM

## 2019-05-19 NOTE — B[facility] Group Notes (Signed)
Emotional Regulation 05/19/2019 1PM  Type of Therapy/Topic:  Group Therapy:  Emotion Regulation  Participation Level:  Did Not Attend   Description of Group:   The purpose of this group is to assist patients in learning to regulate negative emotions and experience positive emotions. Patients will be guided to discuss ways in which they have been vulnerable to their negative emotions. These vulnerabilities will be juxtaposed with experiences of positive emotions or situations, and patients will be challenged to use positive emotions to combat negative ones. Special emphasis will be placed on coping with negative emotions in conflict situations, and patients will process healthy conflict resolution skills.  Therapeutic Goals: 1. Patient will identify two positive emotions or experiences to reflect on in order to balance out negative emotions 2. Patient will label two or more emotions that they find the most difficult to experience 3. Patient will demonstrate positive conflict resolution skills through discussion and/or role plays  Summary of Patient Progress:       Therapeutic Modalities:   Cognitive Behavioral Therapy Feelings Identification Dialectical Behavioral Therapy   Suzan Slick, LCSW 05/19/2019 2:53 PM

## 2019-05-20 NOTE — B[facility] Counselor (Signed)
CSW attempted to call the patient's cousin at the new number provided, [phone].  Call went straight to voicemail and CSW left HIPAA compliant voicemail.  Penni Homans, MSW, LCSW 05/20/2019 2:48 PM

## 2019-05-20 NOTE — Progress Notes (Signed)
Patient alert and oriented x 4, affect is flat ,thoughts are disorganized and incoherent at times he appears responding to internal stimuli. 15 minutes safety checks maintained will continue to monitor closely.

## 2019-05-20 NOTE — Progress Notes (Signed)
[facility] MD Progress Note  05/20/2019 3:16 PM CAIRO AGOSTINELLI  MRN:  [id number] Subjective: Follow-up for patient with schizophrenia.  Patient seen and chart reviewed.  Patient has no complaints.  Behavior is mostly calm and cooperative.  No acute bizarre behavior.  Taking care of hygiene adequately following routine well no complaints regarding medication.  Patient appears to be stabilizing most likely near baseline Principal Problem: Schizophrenia (HCC) Diagnosis: Principal Problem:   Schizophrenia (HCC) Active Problems:   Polydipsia   Disorganized behavior   Tardive dyskinesia  Total Time spent with patient: 30 minutes  Past Psychiatric History: Past history of chronic severe mental illness and disability  Past Medical History:  Past Medical History:  Diagnosis Date  . Schizophrenia (HCC)    History reviewed. No pertinent surgical history. Family History: History reviewed. No pertinent family history. Family Psychiatric  History: See previous Social History:  Social History   Substance and Sexual Activity  Alcohol Use None     Social History   Substance and Sexual Activity  Drug Use No    Social History   Socioeconomic History  . Marital status: Single    Spouse name: Not on file  . Number of children: Not on file  . Years of education: Not on file  . Highest education level: Not on file  Occupational History  . Not on file  Tobacco Use  . Smoking status: Current Every Day Smoker    Packs/day: 0.25    Years: 15.00    Pack years: 3.75    Types: Cigarettes  . Smokeless tobacco: Never Used  . Tobacco comment: Cannot state the amount he smokes per day  Substance and Sexual Activity  . Alcohol use: Not on file  . Drug use: No  . Sexual activity: Not Currently  Other Topics Concern  . Not on file  Social History Narrative  . Not on file   Social Determinants of Health   Financial Resource Strain:   . Difficulty of Paying Living Expenses:   Food Insecurity:    . Worried About Programme researcher, broadcasting/film/video in the Last Year:   . Barista in the Last Year:   Transportation Needs:   . Freight forwarder (Medical):   Marland Kitchen Lack of Transportation (Non-Medical):   Physical Activity:   . Days of Exercise per Week:   . Minutes of Exercise per Session:   Stress:   . Feeling of Stress :   Social Connections:   . Frequency of Communication with Friends and Family:   . Frequency of Social Gatherings with Friends and Family:   . Attends Religious Services:   . Active Member of Clubs or Organizations:   . Attends Banker Meetings:   Marland Kitchen Marital Status:    Additional Social History:                         Sleep: Fair  Appetite:  Fair  Current Medications: Current Facility-Administered Medications  Medication Dose Route Frequency Provider Last Rate Last Admin  . acetaminophen (TYLENOL) tablet 650 mg  650 mg Oral Q6H PRN Dixon, Rashaun M, NP      . alum & mag hydroxide-simeth (MAALOX/MYLANTA) 200-200-20 MG/5ML suspension 30 mL  30 mL Oral Q4H PRN Dixon, Rashaun M, NP      . benztropine (COGENTIN) tablet 1 mg  1 mg Oral BID Andron Marrazzo, [name], MD   1 mg at 05/20/19 0736  . divalproex (  DEPAKOTE ER) 24 hr tablet 500 mg  500 mg Oral QHS Maybree Riling T, MD   500 mg at 05/19/19 2200  . hydrOXYzine (ATARAX/VISTARIL) tablet 50 mg  50 mg Oral Q6H PRN Hever Castilleja T, MD      . magnesium hydroxide (MILK OF MAGNESIA) suspension 30 mL  30 mL Oral Daily PRN Dixon, Rashaun M, NP      . paliperidone (INVEGA SUSTENNA) injection 234 mg  234 mg Intramuscular Q30 days Luie Laneve, [name], MD   234 mg at 05/18/19 1743  . traZODone (DESYREL) tablet 50 mg  50 mg Oral QHS PRN Jearld Lesch, NP        Lab Results: No results found for this or any previous visit (from the past 48 hour(s)).  Blood Alcohol level:  Lab Results  Component Value Date   ETH <10 05/15/2019   ETH <10 05/07/2019    Metabolic Disorder Labs: Lab Results  Component Value Date    HGBA1C 5.2 03/27/2018   MPG 102.54 03/27/2018   MPG 102.54 01/31/2017   No results found for: PROLACTIN Lab Results  Component Value Date   CHOL 193 03/27/2018   TRIG 154 (H) 03/27/2018   HDL 51 03/27/2018   CHOLHDL 3.8 03/27/2018   VLDL 31 03/27/2018   LDLCALC 111 (H) 03/27/2018   LDLCALC 102 (H) 01/31/2017    Physical Findings: AIMS:  , ,  ,  ,    CIWA:    COWS:     Musculoskeletal: Strength & Muscle Tone: within normal limits Gait & Station: normal Patient leans: N/A  Psychiatric Specialty Exam: Physical Exam  Nursing note and vitals reviewed. Constitutional: He appears well-developed and well-nourished.  HENT:  Head: Normocephalic and atraumatic.  Eyes: Pupils are equal, round, and reactive to light. Conjunctivae are normal.  Cardiovascular: Regular rhythm and normal heart sounds.  Respiratory: Effort normal.  GI: Soft.  Musculoskeletal:        General: Normal range of motion.     Cervical back: Normal range of motion.  Neurological: He is alert.  Skin: Skin is warm and dry.  Psychiatric: His affect is blunt. His speech is delayed. He is slowed. Thought content is delusional. Cognition and memory are impaired. He expresses impulsivity. He expresses no homicidal and no suicidal ideation.    Review of Systems  Constitutional: Negative.   HENT: Negative.   Eyes: Negative.   Respiratory: Negative.   Cardiovascular: Negative.   Gastrointestinal: Negative.   Musculoskeletal: Negative.   Skin: Negative.   Neurological: Negative.   Psychiatric/Behavioral: Negative.     Blood pressure 139/84, pulse 60, temperature 97.8 F (36.6 C), temperature source Oral, resp. rate 18, height 5\' 8"  (1.727 m), weight 83.9 kg, SpO2 100 %.Body mass index is 28.12 kg/m.  General Appearance: Casual  Eye Contact:  Fair  Speech:  Garbled  Volume:  Decreased  Mood:  Euthymic  Affect:  Constricted  Thought Process:  Disorganized  Orientation:  NA  Thought Content:  Illogical   Suicidal Thoughts:  No  Homicidal Thoughts:  No  Memory:  Immediate;   Fair Recent;   Fair Remote;   Fair  Judgement:  Impaired  Insight:  Shallow  Psychomotor Activity:  TD  Concentration:  Concentration: Poor  Recall:  Poor  Fund of Knowledge:  Poor  Language:  Fair  Akathisia:  No  Handed:  Right  AIMS (if indicated):     Assets:  Housing  ADL's:  Impaired  Cognition:  Impaired,  Mild  Sleep:  Number of Hours: 5.15     Treatment Plan Summary: Daily contact with patient to assess and evaluate symptoms and progress in treatment, Medication management and Plan No change to current medication plan.  Encourage patient in self-care and attending groups.  Most likely discharge by tomorrow if we can identify his family and get them on the phone  Alethia Berthold, MD 05/20/2019, 3:16 PM

## 2019-05-20 NOTE — Progress Notes (Signed)
Pt says that he will give me the urine specimen when he urinates again. Torrie Mayers RN

## 2019-05-20 NOTE — Plan of Care (Signed)
Pt denies anxiety, depression, SI and HI. Pt denies AVH but says that he "hears voices in his ears" and "sees with his eyes". Pt was educated on care plan and verbalizes understanding. Pt was encouraged to attend groups. Torrie Mayers RN Problem: Education: Goal: Charity fundraiser Education information/materials will improve Outcome: Not Progressing Goal: Emotional status will improve Outcome: Not Progressing Goal: Mental status will improve Outcome: Not Progressing Goal: Verbalization of understanding the information provided will improve Outcome: Not Progressing   Problem: Safety: Goal: Periods of time without injury will increase Outcome: Progressing   Problem: Activity: Goal: Will verbalize the importance of balancing activity with adequate rest periods Outcome: Progressing   Problem: Safety: Goal: Ability to redirect hostility and anger into socially appropriate behaviors will improve Outcome: Progressing Goal: Ability to remain free from injury will improve Outcome: Progressing

## 2019-05-20 NOTE — Progress Notes (Signed)
I gave pt a urine specimen cup and told him to give me a sample when he could. Torrie Mayers RN

## 2019-05-20 NOTE — Progress Notes (Signed)
Recreation Therapy Notes  Date: 05/20/2019  Time: 9:30 am   Location: Room 21   Behavioral response: N/A   Intervention Topic: Animal Assisted therapy    Discussion/Intervention: Patient did not attend group.   Clinical Observations/Feedback:  Patient did not attend group.   Yue Flanigan LRT/CTRS        Kiarah Eckstein 05/20/2019 11:06 AM

## 2019-05-20 NOTE — [facility] Group Notes (Signed)
[facility] Group Notes:  (Nursing/MHT/Case Management/Adjunct)  Date:  05/20/2019  Time:  2:35 PM  Type of Therapy:  Community Meeting  Participation Level:  Did Not Attend   Lynelle Smoke [facility] 05/20/2019, 2:35 PM

## 2019-05-21 LAB — BASIC METABOLIC PANEL
Anion gap: 10 (ref 5–15)
BUN: 12 mg/dL (ref 6–20)
CO2: 29 mmol/L (ref 22–32)
Calcium: 9.3 mg/dL (ref 8.9–10.3)
Chloride: 103 mmol/L (ref 98–111)
Creatinine, Ser: 0.96 mg/dL (ref 0.61–1.24)
GFR calc Af Amer: >60 mL/min (ref >60)
GFR calc non Af Amer: >60 mL/min (ref >60)
Glucose, Bld: 89 mg/dL (ref 70–99)
Potassium: 4.1 mmol/L (ref 3.5–5.1)
Sodium: 142 mmol/L (ref 135–145)

## 2019-05-21 NOTE — Progress Notes (Signed)
Patient refused medication stating that he already took medication. MD will be notified.

## 2019-05-21 NOTE — [facility] Group Notes (Signed)
Overcoming Obstacles  05/21/2019 1PM  Type of Therapy and Topic:  Group Therapy:  Overcoming Obstacles  Participation Level:  Did Not Attend    Description of Group:    In this group patients will be encouraged to explore what they see as obstacles to their own wellness and recovery. They will be guided to discuss their thoughts, feelings, and behaviors related to these obstacles. The group will process together ways to cope with barriers, with attention given to specific choices patients can make. Each patient will be challenged to identify changes they are motivated to make in order to overcome their obstacles. This group will be process-oriented, with patients participating in exploration of their own experiences as well as giving and receiving support and challenge from other group members.   Therapeutic Goals: 1. Patient will identify personal and current obstacles as they relate to admission. 2. Patient will identify barriers that currently interfere with their wellness or overcoming obstacles.  3. Patient will identify feelings, thought process and behaviors related to these barriers. 4. Patient will identify two changes they are willing to make to overcome these obstacles:      Summary of Patient Progress     Therapeutic Modalities:   Cognitive Behavioral Therapy Solution Focused Therapy Motivational Interviewing Relapse Prevention Therapy    Lowella Dandy, MSW, LCSW 05/21/2019 2:38 PM

## 2019-05-21 NOTE — Progress Notes (Signed)
Recreation Therapy Notes  Date: 05/21/2019  Time: 9:30 am   Location: Craft room   Behavioral response: N/A   Intervention Topic: Communication   Discussion/Intervention: Patient did not attend group.   Clinical Observations/Feedback:  Patient did not attend group.   Oluwasemilore Pascuzzi LRT/CTRS        Davis Ambrosini 05/21/2019 11:57 AM

## 2019-05-21 NOTE — Progress Notes (Signed)
[facility] MD Progress Note  05/21/2019 3:02 PM Shane Nash  MRN:  [id number] Subjective:Patient seen and case discussed with treatment team. Patient appears to be doing well at this time. He is observed to have improved thought processes and engages well with staff. He is very animated throughout the discussion and appears to be at his baseline. He is displaying normal behavior, although he seems to be confused about his phone. He requests to call his nephew girlfriend because he would like to go home and that they are keeping him here. He denies any si/hi/avh.    Principal Problem: Schizophrenia ([place]) Diagnosis: Principal Problem:   Schizophrenia ([place]) Active Problems:   Polydipsia   Disorganized behavior   Tardive dyskinesia  Total Time spent with patient: 30 minutes  Past Psychiatric History: Past history of chronic severe mental illness and disability  Past Medical History:  Past Medical History:  Diagnosis Date  . Schizophrenia ([place])    History reviewed. No pertinent surgical history. Family History: History reviewed. No pertinent family history. Family Psychiatric  History: See previous Social History:  Social History   Substance and Sexual Activity  Alcohol Use None     Social History   Substance and Sexual Activity  Drug Use No    Social History   Socioeconomic History  . Marital status: Single    Spouse name: Not on file  . Number of children: Not on file  . Years of education: Not on file  . Highest education level: Not on file  Occupational History  . Not on file  Tobacco Use  . Smoking status: Current Every Day Smoker    Packs/day: 0.25    Years: 15.00    Pack years: 3.75    Types: Cigarettes  . Smokeless tobacco: Never Used  . Tobacco comment: Cannot state the amount he smokes per day  Substance and Sexual Activity  . Alcohol use: Not on file  . Drug use: No  . Sexual activity: Not Currently  Other Topics Concern  . Not on file  Social History  Narrative  . Not on file   Social Determinants of Health   Financial Resource Strain:   . Difficulty of Paying Living Expenses:   Food Insecurity:   . Worried About Charity fundraiser in the Last Year:   . Arboriculturist in the Last Year:   Transportation Needs:   . Film/video editor (Medical):   Marland Kitchen Lack of Transportation (Non-Medical):   Physical Activity:   . Days of Exercise per Week:   . Minutes of Exercise per Session:   Stress:   . Feeling of Stress :   Social Connections:   . Frequency of Communication with Friends and Family:   . Frequency of Social Gatherings with Friends and Family:   . Attends Religious Services:   . Active Member of Clubs or Organizations:   . Attends Archivist Meetings:   Marland Kitchen Marital Status:    Additional Social History:                         Sleep: Fair  Appetite:  Fair  Current Medications: Current Facility-Administered Medications  Medication Dose Route Frequency Provider Last Rate Last Admin  . acetaminophen (TYLENOL) tablet 650 mg  650 mg Oral Q6H PRN Dixon, Rashaun M, NP      . alum & mag hydroxide-simeth (MAALOX/MYLANTA) 200-200-20 MG/5ML suspension 30 mL  30 mL Oral Q4H PRN  Lerry Liner M, NP      . benztropine (COGENTIN) tablet 1 mg  1 mg Oral BID [name], [name], MD   1 mg at 05/20/19 1704  . divalproex (DEPAKOTE ER) 24 hr tablet 500 mg  500 mg Oral QHS [name], [name], MD   500 mg at 05/20/19 2110  . hydrOXYzine (ATARAX/VISTARIL) tablet 50 mg  50 mg Oral Q6H PRN [name], [name], MD   50 mg at 05/20/19 2110  . magnesium hydroxide (MILK OF MAGNESIA) suspension 30 mL  30 mL Oral Daily PRN Dixon, Rashaun M, NP      . paliperidone (INVEGA SUSTENNA) injection 234 mg  234 mg Intramuscular Q30 days [name], [name], MD   234 mg at 05/18/19 1743  . traZODone (DESYREL) tablet 50 mg  50 mg Oral QHS PRN Jearld Lesch, NP   50 mg at 05/20/19 2110    Lab Results:  Results for orders placed or performed during the  hospital encounter of 05/17/19 (from the past 48 hour(s))  Basic metabolic panel     Status: None   Collection Time: 05/21/19  7:20 AM  Result Value Ref Range   Sodium 142 135 - 145 mmol/L   Potassium 4.1 3.5 - 5.1 mmol/L   Chloride 103 98 - 111 mmol/L   CO2 29 22 - 32 mmol/L   Glucose, Bld 89 70 - 99 mg/dL    Comment: Glucose reference range applies only to samples taken after fasting for at least 8 hours.   BUN 12 6 - 20 mg/dL   Creatinine, Ser 9.32 0.61 - 1.24 mg/dL   Calcium 9.3 8.9 - 35.5 mg/dL   GFR calc non Af Amer >60 >60 mL/min   GFR calc Af Amer >60 >60 mL/min   Anion gap 10 5 - 15    Comment: Performed at [facility], [address] [place], [place] [address]    Blood Alcohol level:  Lab Results  Component Value Date   [facility] <10 05/15/2019   ETH <10 05/07/2019    Metabolic Disorder Labs: Lab Results  Component Value Date   HGBA1C 5.2 03/27/2018   MPG 102.54 03/27/2018   MPG 102.54 01/31/2017   No results found for: PROLACTIN Lab Results  Component Value Date   CHOL 193 03/27/2018   TRIG 154 (H) 03/27/2018   HDL 51 03/27/2018   CHOLHDL 3.8 03/27/2018   VLDL 31 03/27/2018   LDLCALC 111 (H) 03/27/2018   LDLCALC 102 (H) 01/31/2017    Physical Findings: AIMS:  , ,  ,  ,    CIWA:    COWS:     Musculoskeletal: Strength & Muscle Tone: within normal limits Gait & Station: normal Patient leans: N/A  Psychiatric Specialty Exam: Physical Exam  Nursing note and vitals reviewed. Constitutional: He appears well-developed and well-nourished.  HENT:  Head: Normocephalic and atraumatic.  Eyes: Pupils are equal, round, and reactive to light. Conjunctivae are normal.  Cardiovascular: Regular rhythm and normal heart sounds.  Respiratory: Effort normal.  GI: Soft.  Musculoskeletal:        General: Normal range of motion.     Cervical back: Normal range of motion.  Neurological: He is alert.  Skin: Skin is warm and dry.  Psychiatric: His affect is  blunt (improved). His speech is delayed. He is slowed. Thought content is not delusional. Cognition and memory are normal. He expresses impulsivity. He expresses no homicidal and no suicidal ideation.    Review of  Systems  Constitutional: Negative.   HENT: Negative.   Eyes: Negative.   Respiratory: Negative.   Cardiovascular: Negative.   Gastrointestinal: Negative.   Musculoskeletal: Negative.   Skin: Negative.   Neurological: Negative.   Psychiatric/Behavioral: Negative.     Blood pressure 98/71, pulse (!) 55, temperature 98.5 F (36.9 C), temperature source Oral, resp. rate 18, height 5\' 8"  (1.727 m), weight 83.9 kg, SpO2 100 %.Body mass index is 28.12 kg/m.  General Appearance: Casual  Eye Contact:  Fair  Speech:  Slurred  Volume:  Decreased  Mood:  Euthymic  Affect:  Congruent  Thought Process:  Linear and Descriptions of Associations: Tangential  Orientation:  Full (Time, Place, and Person)  Thought Content:  Logical  Suicidal Thoughts:  Denies  Homicidal Thoughts:  Denies  Memory:  Immediate;   Fair Recent;   Fair Remote;   Fair  Judgement:  Impaired  Insight:  Shallow  Psychomotor Activity:  TD  Concentration:  Concentration: Poor  Recall:  Poor  Fund of Knowledge:  Poor  Language:  Fair  Akathisia:  No  Handed:  Right  AIMS (if indicated):     Assets:  Housing  ADL's:  Impaired  Cognition:  Impaired,  Mild  Sleep:  Number of Hours: 6     Treatment Plan Summary: No changes to be made as of today 05/21/2019.  Daily contact with patient to assess and evaluate symptoms and progress in treatment, Medication management and Plan No change to current medication plan.  Encourage patient in self-care and attending groups.  Most likely discharge by tomorrow if we can identify his family and get them on the phone  05/23/2019, FNP 05/21/2019, 3:02 PM

## 2019-05-21 NOTE — Progress Notes (Signed)
Patient alert and oriented x 4, affect is flat ,thoughts are disorganized at times, he was noted pacing earlier he appeared anxious and restless, he was   incoherent at times he appears responding to internal stimuli. 15 minutes safety checks maintained will continue to monitor closely

## 2019-05-21 NOTE — BHH Counselor (Signed)
CSW attempted to call the patient's cousin at the new number provided, [phone].  Call went straight to voicemail and CSW left HIPAA compliant voicemail.  Penni Homans, MSW, LCSW 05/21/2019 2:41 PM

## 2019-05-21 NOTE — Plan of Care (Signed)
D- Patient alert and oriented. Patient presented in a pleasant mood on assessment stating that he slept good last night and had no complaints to voice to this Clinical research associate. Patient denied any signs/symptoms of depression/anxiety stating "I feel good, like I'm ready to be let go". Patient also denied SI, HI, AVH, and pain at this time. Patient's goal for today is "nothing but to try to get out of here".  A- Support and encouragement provided.  Routine safety checks conducted every 15 minutes.  Patient informed to notify staff with problems or concerns.  R- Patient contracts for safety at this time. Patient receptive, calm, and cooperative. Patient remains safe at this time.  Problem: Education: Goal: Knowledge of  General Education information/materials will improve Outcome: Progressing Goal: Emotional status will improve Outcome: Progressing Goal: Mental status will improve Outcome: Progressing Goal: Verbalization of understanding the information provided will improve Outcome: Progressing   Problem: Safety: Goal: Periods of time without injury will increase Outcome: Progressing   Problem: Activity: Goal: Will verbalize the importance of balancing activity with adequate rest periods Outcome: Progressing   Problem: Safety: Goal: Ability to redirect hostility and anger into socially appropriate behaviors will improve Outcome: Progressing Goal: Ability to remain free from injury will improve Outcome: Progressing

## 2019-05-21 NOTE — Progress Notes (Signed)
[facility] MD Progress Note  05/21/2019 4:14 PM Shane Nash  MRN:  [id number] Subjective: Patient seen chart reviewed.  Follow-up 39 year old man with schizophrenia.  No new complaints.  Denies hallucinations.  Denies suicidal or violent thoughts.  Tolerating medicine well.  Anxious to go home. Principal Problem: Schizophrenia ([place]) Diagnosis: Principal Problem:   Schizophrenia ([place]) Active Problems:   Polydipsia   Disorganized behavior   Tardive dyskinesia  Total Time spent with patient: 30 minutes  Past Psychiatric History: Past history of schizophrenia  Past Medical History:  Past Medical History:  Diagnosis Date  . Schizophrenia ([place])    History reviewed. No pertinent surgical history. Family History: History reviewed. No pertinent family history. Family Psychiatric  History: See previous Social History:  Social History   Substance and Sexual Activity  Alcohol Use None     Social History   Substance and Sexual Activity  Drug Use No    Social History   Socioeconomic History  . Marital status: Single    Spouse name: Not on file  . Number of children: Not on file  . Years of education: Not on file  . Highest education level: Not on file  Occupational History  . Not on file  Tobacco Use  . Smoking status: Current Every Day Smoker    Packs/day: 0.25    Years: 15.00    Pack years: 3.75    Types: Cigarettes  . Smokeless tobacco: Never Used  . Tobacco comment: Cannot state the amount he smokes per day  Substance and Sexual Activity  . Alcohol use: Not on file  . Drug use: No  . Sexual activity: Not Currently  Other Topics Concern  . Not on file  Social History Narrative  . Not on file   Social Determinants of Health   Financial Resource Strain:   . Difficulty of Paying Living Expenses:   Food Insecurity:   . Worried About Charity fundraiser in the Last Year:   . Arboriculturist in the Last Year:   Transportation Needs:   . Film/video editor  (Medical):   Marland Kitchen Lack of Transportation (Non-Medical):   Physical Activity:   . Days of Exercise per Week:   . Minutes of Exercise per Session:   Stress:   . Feeling of Stress :   Social Connections:   . Frequency of Communication with Friends and Family:   . Frequency of Social Gatherings with Friends and Family:   . Attends Religious Services:   . Active Member of Clubs or Organizations:   . Attends Archivist Meetings:   Marland Kitchen Marital Status:    Additional Social History:                         Sleep: Fair  Appetite:  Fair  Current Medications: Current Facility-Administered Medications  Medication Dose Route Frequency Provider Last Rate Last Admin  . acetaminophen (TYLENOL) tablet 650 mg  650 mg Oral Q6H PRN Dixon, Rashaun M, NP      . alum & mag hydroxide-simeth (MAALOX/MYLANTA) 200-200-20 MG/5ML suspension 30 mL  30 mL Oral Q4H PRN Dixon, Rashaun M, NP      . benztropine (COGENTIN) tablet 1 mg  1 mg Oral BID Fusako Tanabe, Madie Reno, MD   1 mg at 05/20/19 1704  . divalproex (DEPAKOTE ER) 24 hr tablet 500 mg  500 mg Oral QHS Megan Hayduk T, MD   500 mg at 05/20/19 2110  .  hydrOXYzine (ATARAX/VISTARIL) tablet 50 mg  50 mg Oral Q6H PRN Damontae Loppnow, [name], MD   50 mg at 05/20/19 2110  . magnesium hydroxide (MILK OF MAGNESIA) suspension 30 mL  30 mL Oral Daily PRN Dixon, Rashaun M, NP      . paliperidone (INVEGA SUSTENNA) injection 234 mg  234 mg Intramuscular Q30 days Jamall Strohmeier, [name], MD   234 mg at 05/18/19 1743  . traZODone (DESYREL) tablet 50 mg  50 mg Oral QHS PRN Jearld Lesch, NP   50 mg at 05/20/19 2110    Lab Results:  Results for orders placed or performed during the hospital encounter of 05/17/19 (from the past 48 hour(s))  Basic metabolic panel     Status: None   Collection Time: 05/21/19  7:20 AM  Result Value Ref Range   Sodium 142 135 - 145 mmol/L   Potassium 4.1 3.5 - 5.1 mmol/L   Chloride 103 98 - 111 mmol/L   CO2 29 22 - 32 mmol/L   Glucose, Bld 89  70 - 99 mg/dL    Comment: Glucose reference range applies only to samples taken after fasting for at least 8 hours.   BUN 12 6 - 20 mg/dL   Creatinine, Ser 9.37 0.61 - 1.24 mg/dL   Calcium 9.3 8.9 - 16.9 mg/dL   GFR calc non Af Amer >60 >60 mL/min   GFR calc Af Amer >60 >60 mL/min   Anion gap 10 5 - 15    Comment: Performed at [facility], [address] [place], [place] [address]    Blood Alcohol level:  Lab Results  Component Value Date   [facility] <10 05/15/2019   ETH <10 05/07/2019    Metabolic Disorder Labs: Lab Results  Component Value Date   HGBA1C 5.2 03/27/2018   MPG 102.54 03/27/2018   MPG 102.54 01/31/2017   No results found for: PROLACTIN Lab Results  Component Value Date   CHOL 193 03/27/2018   TRIG 154 (H) 03/27/2018   HDL 51 03/27/2018   CHOLHDL 3.8 03/27/2018   VLDL 31 03/27/2018   LDLCALC 111 (H) 03/27/2018   LDLCALC 102 (H) 01/31/2017    Physical Findings: AIMS:  , ,  ,  ,    CIWA:    COWS:     Musculoskeletal: Strength & Muscle Tone: within normal limits Gait & Station: normal Patient leans: N/A  Psychiatric Specialty Exam: Physical Exam  Nursing note and vitals reviewed. Constitutional: He appears well-developed and well-nourished.  HENT:  Head: Normocephalic and atraumatic.  Eyes: Pupils are equal, round, and reactive to light. Conjunctivae are normal.  Cardiovascular: Regular rhythm and normal heart sounds.  Respiratory: Effort normal.  GI: Soft.  Musculoskeletal:        General: Normal range of motion.     Cervical back: Normal range of motion.  Neurological: He is alert.  Skin: Skin is warm and dry.  Psychiatric: His affect is blunt. His speech is delayed. He is slowed. Cognition and memory are impaired. He expresses inappropriate judgment. He expresses no homicidal and no suicidal ideation.    Review of Systems  Constitutional: Negative.   HENT: Negative.   Eyes: Negative.   Respiratory: Negative.   Cardiovascular:  Negative.   Gastrointestinal: Negative.   Musculoskeletal: Negative.   Skin: Negative.   Neurological: Negative.   Psychiatric/Behavioral: Positive for dysphoric mood.    Blood pressure 98/71, pulse (!) 55, temperature 98.5 F (36.9 C), temperature source Oral, resp. rate 18, height  5\' 8"  (1.727 m), weight 83.9 kg, SpO2 100 %.Body mass index is 28.12 kg/m.  General Appearance: Casual  Eye Contact:  Minimal  Speech:  Slow and Slurred  Volume:  Decreased  Mood:  Dysphoric  Affect:  Constricted  Thought Process:  Disorganized  Orientation:  Negative  Thought Content:  Illogical  Suicidal Thoughts:  No  Homicidal Thoughts:  No  Memory:  Immediate;   Fair Recent;   Fair Remote;   Fair  Judgement:  Impaired  Insight:  Shallow  Psychomotor Activity:  Decreased  Concentration:  Concentration: Fair  Recall:  of Knowledge:  Fair  Language:  Fair  Akathisia:  No  Handed:  Right  AIMS (if indicated):     Assets:  Desire for Improvement  ADL's:  Intact  Cognition:  Impaired,  Moderate  Sleep:  Number of Hours: 6     Treatment Plan Summary: Daily contact with patient to assess and evaluate symptoms and progress in treatment, Medication management and Plan No change to medication.  We had planned to discharge him today but have been unable to contact his family.  Continue monitoring and 15-minute checks while we work on discharge  [employer], MD 05/21/2019, 4:14 PM

## 2019-05-22 NOTE — Progress Notes (Signed)
Patient is quiet and reserved.  He has been observed being more active in the milue eating and watching tv with peers.  Patient is not very talkative, but will interact when approached.  He received prescribed medication and tolerated without incident. Patient denies SI/HI/AVH depression and anxiety.Although he denies patient observed responding to internal stimuli and at times can be heard talking to himself in his room. Patient remains safe at this time with 15 minute safety rounds and informed to contact staff with any concerns.

## 2019-05-22 NOTE — BHH Group Notes (Signed)
LCSW Group Therapy Note  05/22/2019 5:03 PM  Type of Therapy/Topic:  Group Therapy:  Balance in Life  Participation Level:  Did Not Attend  Description of Group:    This group will address the concept of balance and how it feels and looks when one is unbalanced. Patients will be encouraged to process areas in their lives that are out of balance and identify reasons for remaining unbalanced. Facilitators will guide patients in utilizing problem-solving interventions to address and correct the stressor making their life unbalanced. Understanding and applying boundaries will be explored and addressed for obtaining and maintaining a balanced life. Patients will be encouraged to explore ways to assertively make their unbalanced needs known to significant others in their lives, using other group members and facilitator for support and feedback.  Therapeutic Goals: 1. Patient will identify two or more emotions or situations they have that consume much of in their lives. 2. Patient will identify signs/triggers that life has become out of balance:  3. Patient will identify two ways to set boundaries in order to achieve balance in their lives:  4. Patient will demonstrate ability to communicate their needs through discussion and/or role plays  Summary of Patient Progress:  X    Therapeutic Modalities:   Cognitive Behavioral Therapy Solution-Focused Therapy Assertiveness Training  Teresita Madura MSW, [place] Clinical Social Work 05/22/2019 5:03 PM

## 2019-05-22 NOTE — Plan of Care (Signed)
°  Problem: Education: °Goal: Emotional status will improve °Outcome: Progressing °Goal: Mental status will improve °Outcome: Progressing °Goal: Verbalization of understanding the information provided will improve °Outcome: Progressing °  °

## 2019-05-22 NOTE — Progress Notes (Signed)
[facility] MD Progress Note  05/22/2019 10:25 AM Shane Nash  MRN:  [id number]   39yo M with psychiatric history of Schizophrenia, who was admitted for psychosis.   Patient seen.  Chart reviewed. Patient discussed with nursing; no overnight events reported.  Subjective: Patient reports "doing good" and is asking about discharge. He states his niece can pick him up. He denies any current mental or physical complaints. He denies feeling depressed, anxious, suicidal, homicidal, denies any hallucinations, does not express any delusions. He reports good sleep and appetite. He reports agreement to continue taking all medications after hospital discharge. He denies side effects from medications.     Principal Problem: Schizophrenia (HCC) Diagnosis: Principal Problem:   Schizophrenia (HCC) Active Problems:   Polydipsia   Disorganized behavior   Tardive dyskinesia  Total Time spent with patient: 15 minutes  Past Psychiatric History: see H&P  Past Medical History:  Past Medical History:  Diagnosis Date  . Schizophrenia (HCC)    History reviewed. No pertinent surgical history. Family History: History reviewed. No pertinent family history. Family Psychiatric  History: see H&P Social History:  Social History   Substance and Sexual Activity  Alcohol Use None     Social History   Substance and Sexual Activity  Drug Use No    Social History   Socioeconomic History  . Marital status: Single    Spouse name: Not on file  . Number of children: Not on file  . Years of education: Not on file  . Highest education level: Not on file  Occupational History  . Not on file  Tobacco Use  . Smoking status: Current Every Day Smoker    Packs/day: 0.25    Years: 15.00    Pack years: 3.75    Types: Cigarettes  . Smokeless tobacco: Never Used  . Tobacco comment: Cannot state the amount he smokes per day  Substance and Sexual Activity  . Alcohol use: Not on file  . Drug use: No  . Sexual  activity: Not Currently  Other Topics Concern  . Not on file  Social History Narrative  . Not on file   Social Determinants of Health   Financial Resource Strain:   . Difficulty of Paying Living Expenses:   Food Insecurity:   . Worried About Programme researcher, broadcasting/film/video in the Last Year:   . Barista in the Last Year:   Transportation Needs:   . Freight forwarder (Medical):   Marland Kitchen Lack of Transportation (Non-Medical):   Physical Activity:   . Days of Exercise per Week:   . Minutes of Exercise per Session:   Stress:   . Feeling of Stress :   Social Connections:   . Frequency of Communication with Friends and Family:   . Frequency of Social Gatherings with Friends and Family:   . Attends Religious Services:   . Active Member of Clubs or Organizations:   . Attends Banker Meetings:   Marland Kitchen Marital Status:    Additional Social History:                         Sleep: Good  Appetite:  Good  Current Medications: Current Facility-Administered Medications  Medication Dose Route Frequency Provider Last Rate Last Admin  . acetaminophen (TYLENOL) tablet 650 mg  650 mg Oral Q6H PRN Dixon, Rashaun M, NP      . alum & mag hydroxide-simeth (MAALOX/MYLANTA) 200-200-20 MG/5ML suspension 30 mL  30 mL Oral Q4H PRN Deloria Lair, NP      . benztropine (COGENTIN) tablet 1 mg  1 mg Oral BID [name], Madie Reno, MD   1 mg at 05/22/19 0856  . divalproex (DEPAKOTE ER) 24 hr tablet 500 mg  500 mg Oral QHS [name], [name], MD   500 mg at 05/21/19 2125  . hydrOXYzine (ATARAX/VISTARIL) tablet 50 mg  50 mg Oral Q6H PRN [name], Madie Reno, MD   50 mg at 05/20/19 2110  . magnesium hydroxide (MILK OF MAGNESIA) suspension 30 mL  30 mL Oral Daily PRN Dixon, Rashaun M, NP      . paliperidone (INVEGA SUSTENNA) injection 234 mg  234 mg Intramuscular Q30 days [name], Madie Reno, MD   234 mg at 05/18/19 1743  . traZODone (DESYREL) tablet 50 mg  50 mg Oral QHS PRN Deloria Lair, NP   50 mg at  05/21/19 2125    Lab Results:  Results for orders placed or performed during the hospital encounter of 05/17/19 (from the past 48 hour(s))  Basic metabolic panel     Status: None   Collection Time: 05/21/19  7:20 AM  Result Value Ref Range   Sodium 142 135 - 145 mmol/L   Potassium 4.1 3.5 - 5.1 mmol/L   Chloride 103 98 - 111 mmol/L   CO2 29 22 - 32 mmol/L   Glucose, Bld 89 70 - 99 mg/dL    Comment: Glucose reference range applies only to samples taken after fasting for at least 8 hours.   BUN 12 6 - 20 mg/dL   Creatinine, Ser 0.96 0.61 - 1.24 mg/dL   Calcium 9.3 8.9 - 10.3 mg/dL   GFR calc non Af Amer >60 >60 mL/min   GFR calc Af Amer >60 >60 mL/min   Anion gap 10 5 - 15    Comment: Performed at [facility], [address] [place], [place] [address]    Blood Alcohol level:  Lab Results  Component Value Date   [facility] <10 05/15/2019   ETH <10 00/93/8182    Metabolic Disorder Labs: Lab Results  Component Value Date   HGBA1C 5.2 03/27/2018   MPG 102.54 03/27/2018   MPG 102.54 01/31/2017   No results found for: PROLACTIN Lab Results  Component Value Date   CHOL 193 03/27/2018   TRIG 154 (H) 03/27/2018   HDL 51 03/27/2018   CHOLHDL 3.8 03/27/2018   VLDL 31 03/27/2018   LDLCALC 111 (H) 03/27/2018   LDLCALC 102 (H) 01/31/2017    Physical Findings: AIMS:  , ,  ,  ,    CIWA:    COWS:     Musculoskeletal: Strength & Muscle Tone: within normal limits Gait & Station: normal Patient leans: N/A  Psychiatric Specialty Exam: Physical Exam  Review of Systems  Constitutional: Negative for appetite change, fatigue and fever.  HENT: Negative for congestion and sore throat.   Eyes: Negative for pain and visual disturbance.  Respiratory: Negative for cough and chest tightness.   Cardiovascular: Negative for chest pain.  Gastrointestinal: Negative for abdominal pain.  Neurological: Negative for dizziness, seizures and weakness.    Blood pressure 120/85, pulse  (!) 53, temperature 97.6 F (36.4 C), temperature source Oral, resp. rate 18, height 5\' 8"  (1.727 m), weight 83.9 kg, SpO2 100 %.Body mass index is 28.12 kg/m.  General Appearance: Negative  Eye Contact:  Fair  Speech:  Pressured  Volume:  Normal  Mood:  Euthymic  Affect:  Constricted  Thought Process:  Coherent and Goal Directed  Orientation:  Full (Time, Place, and Person)  Thought Content:  Tangential  Suicidal Thoughts:  No  Homicidal Thoughts:  No  Memory:  Immediate;   Fair Recent;   Fair Remote;   Fair  Judgement:  Fair  Insight:  Shallow  Psychomotor Activity:  Normal  Concentration:  Concentration: Fair and Attention Span: Fair  Recall:  [employer] of Knowledge:  Fair  Language:  Fair  Akathisia:  No  Handed:  Right  AIMS (if indicated):     Assets:  Desire for Improvement  ADL's:  Intact  Cognition:  Impaired,  Mild  Sleep:  Number of Hours: 3.5     Treatment Plan Summary: Daily contact with patient to assess and evaluate symptoms and progress in treatment and Medication management. Patient appears calm and ready for discharge. SW it trying to contact his family. Continue inpatient psych admission; 15-minute checks; daily contact with patient to assess and evaluate symptoms and progress in treatment; psychoeducation while we work on discharge.  Thalia Party, MD 05/22/2019, 10:25 AM

## 2019-05-22 NOTE — Progress Notes (Signed)
CSW called pt's nephew, Ronaldo Miyamoto, at [phone] this date and time for possible discharge. The message rceived was "call cannot be completed as dialed".   CSW called pt's mother, Mitzi Davenport, at [phone] answer and the voicemail box has not been set up. CSW unable to leave a message.  Teresita Madura, MSW, [employer] Clinical Social Worker

## 2019-05-23 NOTE — Tx Team (Signed)
Interdisciplinary Treatment and Diagnostic Plan Update  05/23/2019 Time of Session: 9:00AM Shane Nash MRN: [id number]  Principal Diagnosis: Schizophrenia [facility])  Secondary Diagnoses: Principal Problem:   Schizophrenia ([place]) Active Problems:   Polydipsia   Disorganized behavior   Tardive dyskinesia   Current Medications:  Current Facility-Administered Medications  Medication Dose Route Frequency Provider Last Rate Last Admin  . acetaminophen (TYLENOL) tablet 650 mg  650 mg Oral Q6H PRN Dixon, Rashaun M, NP      . alum & mag hydroxide-simeth (MAALOX/MYLANTA) 200-200-20 MG/5ML suspension 30 mL  30 mL Oral Q4H PRN Dixon, Rashaun M, NP      . benztropine (COGENTIN) tablet 1 mg  1 mg Oral BID [name], Madie Reno, MD   1 mg at 05/22/19 1728  . divalproex (DEPAKOTE ER) 24 hr tablet 500 mg  500 mg Oral QHS [name], [name], MD   500 mg at 05/22/19 2109  . hydrOXYzine (ATARAX/VISTARIL) tablet 50 mg  50 mg Oral Q6H PRN [name], Madie Reno, MD   50 mg at 05/20/19 2110  . magnesium hydroxide (MILK OF MAGNESIA) suspension 30 mL  30 mL Oral Daily PRN Dixon, Rashaun M, NP      . paliperidone (INVEGA SUSTENNA) injection 234 mg  234 mg Intramuscular Q30 days [name], Madie Reno, MD   234 mg at 05/18/19 1743  . traZODone (DESYREL) tablet 50 mg  50 mg Oral QHS PRN Deloria Lair, NP   50 mg at 05/21/19 2125   PTA Medications: Medications Prior to Admission  Medication Sig Dispense Refill Last Dose  . benztropine (COGENTIN) 2 MG tablet Take 1 tablet (2 mg total) by mouth daily. (Patient taking differently: Take 2 mg by mouth at bedtime. ) 30 tablet 1   . diphenhydrAMINE (BENADRYL) 50 MG capsule Take 1 capsule (50 mg total) by mouth at bedtime. 30 capsule 1   . INVEGA SUSTENNA 234 MG/1.5ML SUSY injection Inject 234 mg into the muscle every 30 (thirty) days.       Patient Stressors: Marital or family conflict Medication change or noncompliance  Patient Strengths: Motivation for treatment/growth Supportive  family/friends  Treatment Modalities: Medication Management, Group therapy, Case management,  1 to 1 session with clinician, Psychoeducation, Recreational therapy.   Physician Treatment Plan for Primary Diagnosis: Schizophrenia ([place]) Long Term Goal(s): Improvement in symptoms so as ready for discharge Improvement in symptoms so as ready for discharge   Short Term Goals: Ability to verbalize feelings will improve Ability to demonstrate self-control will improve Ability to maintain clinical measurements within normal limits will improve Compliance with prescribed medications will improve  Medication Management: Evaluate patient's response, side effects, and tolerance of medication regimen.  Therapeutic Interventions: 1 to 1 sessions, Unit Group sessions and Medication administration.  Evaluation of Outcomes: Not Met  Physician Treatment Plan for Secondary Diagnosis: Principal Problem:   Schizophrenia ([place]) Active Problems:   Polydipsia   Disorganized behavior   Tardive dyskinesia  Long Term Goal(s): Improvement in symptoms so as ready for discharge Improvement in symptoms so as ready for discharge   Short Term Goals: Ability to verbalize feelings will improve Ability to demonstrate self-control will improve Ability to maintain clinical measurements within normal limits will improve Compliance with prescribed medications will improve     Medication Management: Evaluate patient's response, side effects, and tolerance of medication regimen.  Therapeutic Interventions: 1 to 1 sessions, Unit Group sessions and Medication administration.  Evaluation of Outcomes: Not Met   RN Treatment Plan for Primary Diagnosis:  Schizophrenia ([place]) Long Term Goal(s): Knowledge of disease and therapeutic regimen to maintain health will improve  Short Term Goals: Ability to demonstrate self-control, Ability to participate in decision making will improve, Ability to verbalize feelings will improve,  Ability to identify and develop effective coping behaviors will improve and Compliance with prescribed medications will improve  Medication Management: RN will administer medications as ordered by provider, will assess and evaluate patient's response and provide education to patient for prescribed medication. RN will report any adverse and/or side effects to prescribing provider.  Therapeutic Interventions: 1 on 1 counseling sessions, Psychoeducation, Medication administration, Evaluate responses to treatment, Monitor vital signs and CBGs as ordered, Perform/monitor CIWA, COWS, AIMS and Fall Risk screenings as ordered, Perform wound care treatments as ordered.  Evaluation of Outcomes: Not Met   LCSW Treatment Plan for Primary Diagnosis: Schizophrenia ([place]) Long Term Goal(s): Safe transition to appropriate next level of care at discharge, Engage patient in therapeutic group addressing interpersonal concerns.  Short Term Goals: Engage patient in aftercare planning with referrals and resources, Increase social support, Increase ability to appropriately verbalize feelings, Increase emotional regulation, Facilitate acceptance of mental health diagnosis and concerns and Increase skills for wellness and recovery  Therapeutic Interventions: Assess for all discharge needs, 1 to 1 time with Social worker, Explore available resources and support systems, Assess for adequacy in community support network, Educate family and significant other(s) on suicide prevention, Complete Psychosocial Assessment, Interpersonal group therapy.  Evaluation of Outcomes: Not Met   Progress in Treatment: Attending groups: Yes. Participating in groups: Yes. Taking medication as prescribed: Yes. Toleration medication: Yes. Family/Significant other contact made: No, will contact:  once permission is given Patient understands diagnosis: No. Discussing patient identified problems/goals with staff: Yes. Medical problems  stabilized or resolved: Yes. Denies suicidal/homicidal ideation: Yes. Issues/concerns per patient self-inventory: No. Other: none  New problem(s) identified: No, Describe:  none  New Short Term/Long Term Goal(s): elimination of symptoms of psychosis, medication management for mood stabilization; development of comprehensive mental wellness plan.  Patient Goals:  "get my own place"  Discharge Plan or Barriers: [place] reports plans to return to the home with his nephew and work on getting his own home. Patient further reports plans to continue with his ACT team.  Reason for Continuation of Hospitalization: Hallucinations Medical Issues Medication stabilization  Estimated Length of Stay:  1-7 dasys  Recreational Therapy: Patient Stressors: N/A Patient Goal: Patient will engage in groups without prompting or encouragement from LRT x3 group sessions within 5 recreation therapy group sessions  Estimated Length of Stay: 3-5 days  Attendees: Patient: 05/23/2019   Physician: Dr. Larita Fife, MD 05/23/2019   Nursing: Lyda Kalata, RN 05/23/2019   RN Care Manager: 05/23/2019   Social Worker: [place], [place] 05/23/2019   Recreational Therapist:  05/23/2019   Other:  05/23/2019  Other:  05/23/2019   Other: 05/23/2019    Scribe for Treatment Team: Charlott Rakes, [place] 05/23/2019 3:41 PM

## 2019-05-23 NOTE — Progress Notes (Signed)
Patient refused his morning medication, stating "not right now". Patient has a history of refusing medication from this writer in the morning and then taking it in the evening. This Clinical research associate will check with patient again later on in the day. MD will be notified.

## 2019-05-23 NOTE — Progress Notes (Signed)
Patient stated to this writer that he would get up to come get his evening medication closer to 6pm, however, he has refused to come. Patient is lying in bed with the cover over his head.

## 2019-05-23 NOTE — [facility] Group Notes (Signed)
LCSW Group Therapy Notes  Date and Time:05/23/19 1:00pm  Type of Therapy and Topic: Group Therapy: Healthy Vs. Unhealthy Coping Strategies  Participation Level: [facility] PARTICIPATION LEVEL: Did Not Attend  Description of Group:  In this group, patients will be encouraged to explore their healthy and unhealthy coping strategics. Coping strategies are actions that we take to deal with stress, problems, or uncomfortable emotions in our daily lives. Each patient will be challenged to read some scenarios and discuss the unhealthy and healthy coping strategies within those scenarios. Also, each patient will be challenged to describe current healthy and unhealthy strategies that they use in their own lives and discuss the outcomes and barriers to those strategies. This group will be process-oriented, with patients participating in exploration of their own experiences as well as giving and receiving support and challenge from other group members.  Therapeutic Goals: 1. Patient will identify personal healthy and unhealthy coping strategies. 2. Patient will identify healthy and unhealthy coping strategies, in others, through scenarios.  3. Patient will identify expected outcomes of healthy and unhealthy coping strategies. 4. Patient will identify barriers to using healthy coping strategies.   Summary of Patient Progress:  X   Therapeutic Modalities:  Cognitive Behavioral Therapy Solution Focused Therapy Motivational Interviewing   Teresita Madura, MSW, [employer] Clinical Social Worker

## 2019-05-23 NOTE — Plan of Care (Signed)
D- Patient alert and oriented. Patient presented in a pleasant mood on assessment stating that he slept good last night and had no complaints to voice to this Clinical research associate. Patient denied SI, HI, AVH, and pain at this time. Patient also denied any signs/symptoms of depression and anxiety. Patient had no stated goals for today.  A- Support and encouragement provided.  Routine safety checks conducted every 15 minutes.  Patient informed to notify staff with problems or concerns.  R- Patient contracts for safety at this time. Patient compliant with treatment plan. Patient receptive, calm, and cooperative. Patient interacts well with others on the unit.  Patient remains safe at this time.  Problem: Education: Goal: Knowledge of [place] [facility] Education information/materials will improve Outcome: Progressing Goal: Emotional status will improve Outcome: Progressing Goal: Mental status will improve Outcome: Progressing Goal: Verbalization of understanding the information provided will improve Outcome: Progressing   Problem: Safety: Goal: Periods of time without injury will increase Outcome: Progressing   Problem: Activity: Goal: Will verbalize the importance of balancing activity with adequate rest periods Outcome: Progressing   Problem: Safety: Goal: Ability to redirect hostility and anger into socially appropriate behaviors will improve Outcome: Progressing Goal: Ability to remain free from injury will improve Outcome: Progressing

## 2019-05-23 NOTE — Progress Notes (Signed)
[facility] MD Progress Note  05/23/2019 9:39 AM Shane WATLINGTON  MRN:  [id number]   39yo M with psychiatric history of Schizophrenia, who was admitted for psychosis.  Patient seen.  Chart reviewed. Patient discussed with nursing; no overnight events reported.  Subjective: Patient reports "I am fine" and is asking about discharge. He denies any current mental or physical complaints. He denies feeling depressed, anxious, suicidal, homicidal, denies any hallucinations, does not express any delusions. He reports good sleep and appetite. He reports agreement to continue taking all medications after hospital discharge. He denies side effects from medications.     Principal Problem: Schizophrenia (HCC) Diagnosis: Principal Problem:   Schizophrenia (HCC) Active Problems:   Polydipsia   Disorganized behavior   Tardive dyskinesia  Total Time spent with patient: 15 minutes  Past Psychiatric History: see H&P  Past Medical History:  Past Medical History:  Diagnosis Date  . Schizophrenia (HCC)    History reviewed. No pertinent surgical history. Family History: History reviewed. No pertinent family history. Family Psychiatric  History: see H&P Social History:  Social History   Substance and Sexual Activity  Alcohol Use None     Social History   Substance and Sexual Activity  Drug Use No    Social History   Socioeconomic History  . Marital status: Single    Spouse name: Not on file  . Number of children: Not on file  . Years of education: Not on file  . Highest education level: Not on file  Occupational History  . Not on file  Tobacco Use  . Smoking status: Current Every Day Smoker    Packs/day: 0.25    Years: 15.00    Pack years: 3.75    Types: Cigarettes  . Smokeless tobacco: Never Used  . Tobacco comment: Cannot state the amount he smokes per day  Substance and Sexual Activity  . Alcohol use: Not on file  . Drug use: No  . Sexual activity: Not Currently  Other Topics  Concern  . Not on file  Social History Narrative  . Not on file   Social Determinants of Health   Financial Resource Strain:   . Difficulty of Paying Living Expenses:   Food Insecurity:   . Worried About Programme researcher, broadcasting/film/video in the Last Year:   . Barista in the Last Year:   Transportation Needs:   . Freight forwarder (Medical):   Marland Kitchen Lack of Transportation (Non-Medical):   Physical Activity:   . Days of Exercise per Week:   . Minutes of Exercise per Session:   Stress:   . Feeling of Stress :   Social Connections:   . Frequency of Communication with Friends and Family:   . Frequency of Social Gatherings with Friends and Family:   . Attends Religious Services:   . Active Member of Clubs or Organizations:   . Attends Banker Meetings:   Marland Kitchen Marital Status:    Additional Social History:                         Sleep: Good  Appetite:  Good  Current Medications: Current Facility-Administered Medications  Medication Dose Route Frequency Provider Last Rate Last Admin  . acetaminophen (TYLENOL) tablet 650 mg  650 mg Oral Q6H PRN Dixon, Rashaun M, NP      . alum & mag hydroxide-simeth (MAALOX/MYLANTA) 200-200-20 MG/5ML suspension 30 mL  30 mL Oral Q4H PRN Dixon, Rashaun M,  NP      . benztropine (COGENTIN) tablet 1 mg  1 mg Oral BID [name], [name], MD   1 mg at 05/22/19 1728  . divalproex (DEPAKOTE ER) 24 hr tablet 500 mg  500 mg Oral QHS [name], [name], MD   500 mg at 05/22/19 2109  . hydrOXYzine (ATARAX/VISTARIL) tablet 50 mg  50 mg Oral Q6H PRN [name], [name], MD   50 mg at 05/20/19 2110  . magnesium hydroxide (MILK OF MAGNESIA) suspension 30 mL  30 mL Oral Daily PRN Dixon, Rashaun M, NP      . paliperidone (INVEGA SUSTENNA) injection 234 mg  234 mg Intramuscular Q30 days [name], [name], MD   234 mg at 05/18/19 1743  . traZODone (DESYREL) tablet 50 mg  50 mg Oral QHS PRN Jearld Lesch, NP   50 mg at 05/21/19 2125    Lab Results:  No  results found for this or any previous visit (from the past 48 hour(s)).  Blood Alcohol level:  Lab Results  Component Value Date   ETH <10 05/15/2019   ETH <10 05/07/2019    Metabolic Disorder Labs: Lab Results  Component Value Date   HGBA1C 5.2 03/27/2018   MPG 102.54 03/27/2018   MPG 102.54 01/31/2017   No results found for: PROLACTIN Lab Results  Component Value Date   CHOL 193 03/27/2018   TRIG 154 (H) 03/27/2018   HDL 51 03/27/2018   CHOLHDL 3.8 03/27/2018   VLDL 31 03/27/2018   LDLCALC 111 (H) 03/27/2018   LDLCALC 102 (H) 01/31/2017    Physical Findings: AIMS:  , ,  ,  ,    CIWA:    COWS:     Musculoskeletal: Strength & Muscle Tone: within normal limits Gait & Station: normal Patient leans: N/A  Psychiatric Specialty Exam: Physical Exam   Review of Systems  Constitutional: Negative for appetite change, fatigue and fever.  HENT: Negative for congestion and sore throat.   Eyes: Negative for pain and visual disturbance.  Respiratory: Negative for cough and chest tightness.   Cardiovascular: Negative for chest pain.  Gastrointestinal: Negative for abdominal pain.  Neurological: Negative for dizziness, seizures and weakness.    Blood pressure 120/85, pulse (!) 53, temperature 97.6 F (36.4 C), temperature source Oral, resp. rate 18, height 5\' 8"  (1.727 m), weight 83.9 kg, SpO2 100 %.Body mass index is 28.12 kg/m.  General Appearance: Negative  Eye Contact:  Fair  Speech:  Pressured  Volume:  Normal  Mood:  Euthymic  Affect:  Constricted  Thought Process:  Coherent and Goal Directed  Orientation:  Full (Time, Place, and Person)  Thought Content:  Tangential  Suicidal Thoughts:  No  Homicidal Thoughts:  No  Memory:  Immediate;   Fair Recent;   Fair Remote;   Fair  Judgement:  Fair  Insight:  Shallow  Psychomotor Activity:  Normal  Concentration:  Concentration: Fair and Attention Span: Fair  Recall:  of Knowledge:  Fair  Language:   Fair  Akathisia:  No  Handed:  Right  AIMS (if indicated):     Assets:  Desire for Improvement  ADL's:  Intact  Cognition:  Impaired,  Mild  Sleep:  Number of Hours: 4.25     Treatment Plan Summary: Daily contact with patient to assess and evaluate symptoms and progress in treatment and Medication management. Patient appears calm and ready for discharge. So far we were not able to contact his family to  coordinate plan for discharge. Continue inpatient psych admission; 15-minute checks; daily contact with patient to assess and evaluate symptoms and progress in treatment; psychoeducation while we work on discharge.  Larita Fife, MD 05/23/2019, 9:39 AM

## 2019-05-24 NOTE — BHH Group Notes (Signed)
LCSW Group Therapy Note   05/24/2019 1:00 PM  Type of Therapy and Topic:  Group Therapy:  Overcoming Obstacles   Participation Level:  Did Not Attend   Description of Group:    In this group patients will be encouraged to explore what they see as obstacles to their own wellness and recovery. They will be guided to discuss their thoughts, feelings, and behaviors related to these obstacles. The group will process together ways to cope with barriers, with attention given to specific choices patients can make. Each patient will be challenged to identify changes they are motivated to make in order to overcome their obstacles. This group will be process-oriented, with patients participating in exploration of their own experiences as well as giving and receiving support and challenge from other group members.   Therapeutic Goals: 1. Patient will identify personal and current obstacles as they relate to admission. 2. Patient will identify barriers that currently interfere with their wellness or overcoming obstacles.  3. Patient will identify feelings, thought process and behaviors related to these barriers. 4. Patient will identify two changes they are willing to make to overcome these obstacles:      Summary of Patient Progress X   Therapeutic Modalities:   Cognitive Behavioral Therapy Solution Focused Therapy Motivational Interviewing Relapse Prevention Therapy  Penni Homans, MSW, LCSW 05/24/2019 12:23 PM

## 2019-05-24 NOTE — Progress Notes (Signed)
Patient refused his evening Cogentin.

## 2019-05-24 NOTE — BHH Group Notes (Signed)
CSW attempted to contact the patient's family, no call was answered and CSW was unable to leave a  HIPAA compliant voicemail.  CSW spoke with the pt's ACT team to see if they have been able to speak with the family.    They report that they have spoken to the patient's family.  They reoprt that they are pursuing alternative housing arrangements for the patient, however, for the time being the patient is able to return to his nephews.   They report that they can provide transportation on 05/25/2019.  Penni Homans, MSW, LCSW 05/24/2019 12:36 PM

## 2019-05-24 NOTE — Plan of Care (Addendum)
D- Patient alert and oriented. Patient presents in a pleasant mood on assessment stating that he slept ok last night and had no complaints to voice to this Clinical research associate. Patient denies SI, HI, AVH, and pain at this time. Patient also denies any signs/symptoms of depression and anxiety, stating that overall he is feeling "alright", although, he did report them as an "8/10" on his self-inventory. Patient also reported hopelessness "9/10", however, he did not express any of this to this Clinical research associate. Patient had no stated goals for today. Patient is more focused on discharge being that he was supposed to discharge one day last week.  A- Support and encouragement provided.  Routine safety checks conducted every 15 minutes.  Patient informed to notify staff with problems or concerns.  R- Patient contracts for safety at this time. Patient receptive, calm, and cooperative. Patient remains safe at this time.  Problem: Education: Goal: Knowledge of [place] [facility] Education information/materials will improve Outcome: Progressing Goal: Emotional status will improve Outcome: Progressing Goal: Mental status will improve Outcome: Progressing Goal: Verbalization of understanding the information provided will improve Outcome: Progressing   Problem: Safety: Goal: Periods of time without injury will increase Outcome: Progressing   Problem: Activity: Goal: Will verbalize the importance of balancing activity with adequate rest periods Outcome: Progressing   Problem: Safety: Goal: Ability to redirect hostility and anger into socially appropriate behaviors will improve Outcome: Progressing Goal: Ability to remain free from injury will improve Outcome: Progressing

## 2019-05-24 NOTE — B[facility] Group Notes (Signed)
B[facility] Group Notes:  (Nursing/MHT/Case Management/Adjunct)  Date:  05/24/2019  Time:  9:47 AM  Type of Therapy:  Community meeting   Participation Level:  Did Not Attend   Hulda Marin 05/24/2019, 9:47 AM

## 2019-05-24 NOTE — Progress Notes (Signed)
[facility] MD Progress Note  05/24/2019 1:41 PM Shane Nash  MRN:  [id number] Subjective: Follow-up for patient with schizophrenia.  No new complaints.  Still withdrawn and dysphoric but not bizarre or agitated or dangerous Principal Problem: Schizophrenia ([place]) Diagnosis: Principal Problem:   Schizophrenia ([place]) Active Problems:   Polydipsia   Disorganized behavior   Tardive dyskinesia  Total Time spent with patient: 30 minutes  Past Psychiatric History: Past history of chronic schizophrenia  Past Medical History:  Past Medical History:  Diagnosis Date  . Schizophrenia ([place])    History reviewed. No pertinent surgical history. Family History: History reviewed. No pertinent family history. Family Psychiatric  History: See previous Social History:  Social History   Substance and Sexual Activity  Alcohol Use None     Social History   Substance and Sexual Activity  Drug Use No    Social History   Socioeconomic History  . Marital status: Single    Spouse name: Not on file  . Number of children: Not on file  . Years of education: Not on file  . Highest education level: Not on file  Occupational History  . Not on file  Tobacco Use  . Smoking status: Current Every Day Smoker    Packs/day: 0.25    Years: 15.00    Pack years: 3.75    Types: Cigarettes  . Smokeless tobacco: Never Used  . Tobacco comment: Cannot state the amount he smokes per day  Substance and Sexual Activity  . Alcohol use: Not on file  . Drug use: No  . Sexual activity: Not Currently  Other Topics Concern  . Not on file  Social History Narrative  . Not on file   Social Determinants of Health   Financial Resource Strain:   . Difficulty of Paying Living Expenses:   Food Insecurity:   . Worried About Charity fundraiser in the Last Year:   . Arboriculturist in the Last Year:   Transportation Needs:   . Film/video editor (Medical):   Marland Kitchen Lack of Transportation (Non-Medical):   Physical  Activity:   . Days of Exercise per Week:   . Minutes of Exercise per Session:   Stress:   . Feeling of Stress :   Social Connections:   . Frequency of Communication with Friends and Family:   . Frequency of Social Gatherings with Friends and Family:   . Attends Religious Services:   . Active Member of Clubs or Organizations:   . Attends Archivist Meetings:   Marland Kitchen Marital Status:    Additional Social History:                         Sleep: Fair  Appetite:  Fair  Current Medications: Current Facility-Administered Medications  Medication Dose Route Frequency Provider Last Rate Last Admin  . acetaminophen (TYLENOL) tablet 650 mg  650 mg Oral Q6H PRN Dixon, Rashaun M, NP      . alum & mag hydroxide-simeth (MAALOX/MYLANTA) 200-200-20 MG/5ML suspension 30 mL  30 mL Oral Q4H PRN Dixon, Rashaun M, NP      . benztropine (COGENTIN) tablet 1 mg  1 mg Oral BID Anwar Crill, Madie Reno, MD   1 mg at 05/22/19 1728  . divalproex (DEPAKOTE ER) 24 hr tablet 500 mg  500 mg Oral QHS Malaka Ruffner T, MD   500 mg at 05/23/19 2108  . hydrOXYzine (ATARAX/VISTARIL) tablet 50 mg  50 mg Oral  Q6H PRN Kupono Marling, [name], MD   50 mg at 05/20/19 2110  . magnesium hydroxide (MILK OF MAGNESIA) suspension 30 mL  30 mL Oral Daily PRN Dixon, Rashaun M, NP      . paliperidone (INVEGA SUSTENNA) injection 234 mg  234 mg Intramuscular Q30 days Donnamae Muil[place], [name], MD   234 mg at 05/18/19 1743  . traZODone (DESYREL) tablet 50 mg  50 mg Oral QHS PRN Jearld Lesch, NP   50 mg at 05/21/19 2125    Lab Results: No results found for this or any previous visit (from the past 48 hour(s)).  Blood Alcohol level:  Lab Results  Component Value Date   ETH <10 05/15/2019   ETH <10 05/07/2019    Metabolic Disorder Labs: Lab Results  Component Value Date   HGBA1C 5.2 03/27/2018   MPG 102.54 03/27/2018   MPG 102.54 01/31/2017   No results found for: PROLACTIN Lab Results  Component Value Date   CHOL 193 03/27/2018    TRIG 154 (H) 03/27/2018   HDL 51 03/27/2018   CHOLHDL 3.8 03/27/2018   VLDL 31 03/27/2018   LDLCALC 111 (H) 03/27/2018   LDLCALC 102 (H) 01/31/2017    Physical Findings: AIMS:  , ,  ,  ,    CIWA:    COWS:     Musculoskeletal: Strength & Muscle Tone: within normal limits Gait & Station: normal Patient leans: N/A  Psychiatric Specialty Exam: Physical Exam  Nursing note and vitals reviewed. Constitutional: He appears well-developed and well-nourished.  HENT:  Head: Normocephalic and atraumatic.  Eyes: Pupils are equal, round, and reactive to light. Conjunctivae are normal.  Cardiovascular: Regular rhythm and normal heart sounds.  Respiratory: Effort normal.  GI: Soft.  Musculoskeletal:        General: Normal range of motion.     Cervical back: Normal range of motion.  Neurological: He is alert.  Skin: Skin is warm and dry.  Psychiatric: His affect is blunt. His speech is delayed. He is slowed. Cognition and memory are impaired. He expresses impulsivity. He expresses no homicidal and no suicidal ideation.    Review of Systems  Constitutional: Negative.   HENT: Negative.   Eyes: Negative.   Respiratory: Negative.   Cardiovascular: Negative.   Gastrointestinal: Negative.   Musculoskeletal: Negative.   Skin: Negative.   Neurological: Negative.   Psychiatric/Behavioral: Negative.     Blood pressure 123/79, pulse 64, temperature 98.4 F (36.9 C), temperature source Oral, resp. rate 18, height 5\' 8"  (1.727 m), weight 83.9 kg, SpO2 100 %.Body mass index is 28.12 kg/m.  General Appearance: Casual  Eye Contact:  Good  Speech:  Clear and Coherent  Volume:  Normal  Mood:  Irritable  Affect:  Constricted  Thought Process:  Coherent  Orientation:  Full (Time, Place, and Person)  Thought Content:  Logical  Suicidal Thoughts:  No  Homicidal Thoughts:  No  Memory:  Immediate;   Fair Recent;   Fair Remote;   Fair  Judgement:  Fair  Insight:  Fair  Psychomotor Activity:   Normal  Concentration:  Concentration: Fair  Recall:  of Knowledge:  Fair  Language:  Fair  Akathisia:  No  Handed:  Right  AIMS (if indicated):     Assets:  Desire for Improvement  ADL's:  Intact  Cognition:  WNL  Sleep:  Number of Hours: 9     Treatment Plan Summary: Plan discharge tomorrow, we hhope  [employer], MD 05/24/2019,  1:41 PM

## 2019-05-24 NOTE — Progress Notes (Signed)
Patient refused his morning Cogentin, stating "I don't feel like getting up right now, I'll get up in a few minutes". MD was notified.

## 2019-05-24 NOTE — Progress Notes (Signed)
Patient has been isolative and remained in bed all shift. Denies SI, HI and AVH

## 2019-05-24 NOTE — Plan of Care (Signed)
  Problem: Education: Goal: Knowledge of [place] [facility] Education information/materials will improve Outcome: Progressing Goal: Emotional status will improve Outcome: Progressing Goal: Mental status will improve Outcome: Progressing Goal: Verbalization of understanding the information provided will improve Outcome: Progressing   

## 2019-05-25 MED ORDER — HYDROXYZINE HCL 50 MG PO TABS: 50.0000 mg | ORAL_TABLET | Freq: Four times a day (QID) | ORAL | 0 refills | Status: DC | PRN

## 2019-05-25 MED ORDER — DIVALPROEX SODIUM ER 500 MG PO TB24: 500.0000 mg | ORAL_TABLET | Freq: Every day | ORAL | 0 refills | Status: DC

## 2019-05-25 MED ORDER — TRAZODONE HCL 50 MG PO TABS: 50.0000 mg | ORAL_TABLET | Freq: Every evening | ORAL | 0 refills | Status: DC | PRN

## 2019-05-25 MED ORDER — INVEGA SUSTENNA 234 MG/1.5ML IM SUSY: 234.0000 mg | PREFILLED_SYRINGE | INTRAMUSCULAR | 0 refills | Status: DC

## 2019-05-25 MED ORDER — BENZTROPINE MESYLATE 1 MG PO TABS: 1.0000 mg | ORAL_TABLET | Freq: Two times a day (BID) | ORAL | 0 refills | Status: DC

## 2019-05-25 NOTE — Plan of Care (Signed)
  Problem: Group Participation Goal: STG - Patient will engage in groups without prompting or encouragement from LRT x3 group sessions within 5 recreation therapy group sessions Description: STG - Patient will engage in groups without prompting or encouragement from LRT x3 group sessions within 5 recreation therapy group sessions 05/25/2019 1400 by Ernest Haber, LRT Outcome: Not Applicable [id number] by Ernest Haber, LRT Outcome: Not Met (add Reason)  Patient did not attend any groups.

## 2019-05-25 NOTE — BHH Suicide Risk Assessment (Signed)
[facility] Discharge Suicide Risk Assessment   Principal Problem: Schizophrenia [facility]) Discharge Diagnoses: Principal Problem:   Schizophrenia (HCC) Active Problems:   Polydipsia   Disorganized behavior   Tardive dyskinesia   Total Time spent with patient: 30 minutes  Musculoskeletal: Strength & Muscle Tone: within normal limits Gait & Station: normal Patient leans: N/A  Psychiatric Specialty Exam: Review of Systems  Constitutional: Negative.   HENT: Negative.   Eyes: Negative.   Respiratory: Negative.   Cardiovascular: Negative.   Gastrointestinal: Negative.   Musculoskeletal: Negative.   Skin: Negative.   Neurological: Negative.   Psychiatric/Behavioral: Negative.     Blood pressure 123/87, pulse 81, temperature 98.6 F (37 C), temperature source Oral, resp. rate 18, height 5\' 8"  (1.727 m), weight 83.9 kg, SpO2 100 %.Body mass index is 28.12 kg/m.  General Appearance: Casual  Eye Contact::  Good  Speech:  Slow409  Volume:  Decreased  Mood:  Euthymic  Affect:  Constricted  Thought Process:  Coherent  Orientation:  Full (Time, Place, and Person)  Thought Content:  Logical  Suicidal Thoughts:  No  Homicidal Thoughts:  No  Memory:  Immediate;   Fair Recent;   Fair Remote;   Fair  Judgement:  Fair  Insight:  Fair  Psychomotor Activity:  Decreased  Concentration:  Fair  Recall:  [phone] of Knowledge:Fair  Language: Fair  Akathisia:  No  Handed:  Right  AIMS (if indicated):     Assets:  Desire for Improvement Housing Physical Health  Sleep:  Number of Hours: 8  Cognition: WNL  ADL's:  Intact   Mental Status Per Nursing Assessment::   On Admission:  NA  Demographic Factors:  Male  Loss Factors: NA  Historical Factors: Impulsivity  Risk Reduction Factors:   Positive social support  Continued Clinical Symptoms:  Schizophrenia:   Paranoid or undifferentiated type  Cognitive Features That Contribute To Risk:  None    Suicide Risk:  Minimal: No  identifiable suicidal ideation.  Patients presenting with no risk factors but with morbid ruminations; may be classified as minimal risk based on the severity of the depressive symptoms  Follow-up Information    Services, Psychotherapeutic Follow up.   Contact information: [address] [place] [phone]           Plan Of Care/Follow-up recommendations:  Activity:  As tolerated Diet:  Regular Other:  Follow-up with psychotherapeutic services  [phone], MD 05/25/2019, 10:23 AM

## 2019-05-25 NOTE — Progress Notes (Signed)
Pt received dc packet , prescription and belongings. Pt denies SI, HI and AVH. Pt was educated on dc plan and verbalized understanding. Torrie Mayers RN

## 2019-05-25 NOTE — Progress Notes (Signed)
Recreation Therapy Notes   Date: 05/25/2019  Time: 9:30 am   Location: Outside    Behavioral response: N/A   Intervention Topic: [place]    Discussion/Intervention: Patient did not attend group.   Clinical Observations/Feedback:  Patient did not attend group.   Demari Gales LRT/CTRS        Keydi Giel 05/25/2019 12:11 PM 

## 2019-05-25 NOTE — Progress Notes (Signed)
Recreation Therapy Notes  INPATIENT RECREATION TR PLAN  Patient Details Name: Shane Nash MRN: [id number] DOB: [birth year] Today's Date: 05/25/2019  Rec Therapy Plan Is patient appropriate for Therapeutic Recreation?: Yes Treatment times per week: at least 3 Estimated Length of Stay: 5-7 days TR Treatment/Interventions: Group participation (Comment)  Discharge Criteria Pt will be discharged from therapy if:: Discharged Treatment plan/goals/alternatives discussed and agreed upon by:: Patient/family  Discharge Summary Short term goals set: Patient will engage in groups without prompting or encouragement from LRT x3 group sessions within 5 recreation therapy group sessions Short term goals met: Not met Reason goals not met: Patient did not attend any groups. Therapeutic equipment acquired: N/A Reason patient discharged from therapy: Discharge from hospital Pt/family agrees with progress & goals achieved: Yes Date patient discharged from therapy: 06/01/19   Deforest Maiden 05/25/2019, 2:02 PM

## 2019-05-25 NOTE — Discharge Summary (Signed)
Physician Discharge Summary Note  Patient:  Shane Nash is an 39 y.o., male MRN:  [id number] DOB:  [birth year] Patient phone:  [phone] (home)  Patient address:   [place] [address],  Total Time spent with patient: 30 minutes   Date of Admission:  05/17/2019 Date of Discharge: 05/25/2019  Reason for Admission:   Patient seen and chart reviewed.  39 year old man with a history of schizophrenia.  Brought to the emergency room by his act team because of bizarre decompensated behavior.  Patient has been in the emergency room for several days now.  Initially seemed to be quite disorganized and confused saving his urine and feces and containers not really making any sense.  He has been given some medicine over the last couple days and has improved somewhat but still remains very disorganized.  In interview today the patient makes minimal eye contact.  He seems preoccupied by his hand movements.  He talks about wanting to get his own apartment to live in.  He acknowledges that he is never had one before.  He cannot articulate any particular reason why he would want to been doing that now.  Patient does not answer questions about medicine compliance.  Denies suicidal or homicidal thoughts.  Not clear why he has decompensated as it appears that he is on long-acting injectable medicine as his primary drugs and that he may have been compliant with those although we have been given a sheet that says that his Lorayne Bender shot was due on the 13th but his act team act team reported that he needed his shot today.  No alcohol or drug abuse.  No known recent trauma.   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  Has been seen several times in the past at our hospital.  In the past he had a history of doing pretty well when he was on long-acting antipsychotics and Depakote.  Depakote was discontinued last year out of concern that it was contributing to his hyponatremia.  He has a history of hyponatremia that seems to be mostly related to water intoxication.  It does not seem that this has gone away since being off of the Depakote.   Principal Problem: Schizophrenia [facility]) Discharge Diagnoses: Principal Problem:   Schizophrenia ([place]) Active Problems:   Polydipsia   Disorganized behavior   Tardive dyskinesia   Past Medical History:  Past Medical History:  Diagnosis Date  . Schizophrenia ([place])    History reviewed. No pertinent surgical history. Family History: History reviewed. No pertinent family history.  Social History:  Social History   Substance and Sexual Activity  Alcohol Use None     Social History   Substance and Sexual Activity  Drug Use No    Social History   Socioeconomic History  . Marital status: Single    Spouse name: Not on file  . Number of children: Not on file  . Years of education: Not on file  . Highest education level: Not on file  Occupational History  . Not on file  Tobacco Use  . Smoking status: Current Every Day Smoker    Packs/day: 0.25    Years: 15.00    Pack years: 3.75  Types: Cigarettes  . Smokeless tobacco: Never Used  . Tobacco comment: Cannot state the amount he smokes per day  Substance and Sexual Activity  . Alcohol use: Not on file  . Drug use: No  . Sexual activity: Not Currently  Other Topics Concern  . Not on file  Social History Narrative  . Not on file   Social Determinants of Health   Financial Resource Strain:   . Difficulty of Paying Living Expenses:   Food Insecurity:   . Worried About Programme researcher, broadcasting/film/video in the  Last Year:   . Barista in the Last Year:   Transportation Needs:   . Freight forwarder (Medical):   Marland Kitchen Lack of Transportation (Non-Medical):   Physical Activity:   . Days of Exercise per Week:   . Minutes of Exercise per Session:   Stress:   . Feeling of Stress :   Social Connections:   . Frequency of Communication with Friends and Family:   . Frequency of Social Gatherings with Friends and Family:   . Attends Religious Services:   . Active Member of Clubs or Organizations:   . Attends Banker Meetings:   Marland Kitchen Marital Status:     Hospital Course:    Mr. Melman is a 39 year old male with a history of schizophrenia admitted with schizophrenia, who presented disorganized. He received a long acting injectable of Invega 234 mg. He was also restarted on Depakote 500mg  po daily, which was discontinued due to risk of hyponatremia. We obtained baseline labs and repeated his metabolic panel, to closely monitor his sodium levels. He was resumed on all home medications to include Cogentin. Labs obtained were normal. He will discharged today with the medications listed above. Upon discharge he denies suicidal ideations, homicidal ideations and or hallucinations.   Physical Findings: AIMS:  , ,  ,  ,    CIWA:    COWS:     Musculoskeletal: Strength & Muscle Tone: within normal limits Gait & Station: normal Patient leans: N/A  Psychiatric Specialty Exam: Physical Exam  Psychiatric: His speech is slurred. Cognition and memory are normal.    Review of Systems  Neurological: Negative.   Psychiatric/Behavioral: Negative.   All other systems reviewed and are negative.   Blood pressure 123/87, pulse 81, temperature 98.6 F (37 C), temperature source Oral, resp. rate 18, height 5\' 8"  (1.727 m), weight 83.9 kg, SpO2 100 %.Body mass index is 28.12 kg/m.  General Appearance: Casual  Eye Contact:  Good  Speech:  Slurred  Volume:  Normal  Mood:  Euthymic  Affect:  Flat   Thought Process:  Goal Directed and Descriptions of Associations: Intact  Orientation:  Full (Time, Place, and Person)  Thought Content:  WDL  Suicidal Thoughts:  No  Homicidal Thoughts:  No  Memory:  Immediate;   Fair Recent;   Fair Remote;   Fair  Judgement:  Impaired  Insight:  Shallow  Psychomotor Activity:  Normal  Concentration:  Concentration: Fair and Attention Span: Fair  Recall:  of Knowledge:  Fair  Language:  Fair  Akathisia:  No  Handed:  Right  AIMS (if indicated):     Assets:  Communication Skills Desire for Improvement Financial Resources/Insurance Housing Physical Health Resilience Social Support  ADL's:  Intact  Cognition:  WNL  Sleep:  Number of Hours: 8     Have you used any form of tobacco in the last 30 days? (Cigarettes, Smokeless Tobacco,  Cigars, and/or Pipes): Yes  Has this patient used any form of tobacco in the last 30 days? (Cigarettes, Smokeless Tobacco, Cigars, and/or Pipes) Yes, Yes, A prescription for an FDA-approved tobacco cessation medication was offered at discharge and the patient refused  Blood Alcohol level:  Lab Results  Component Value Date   [facility] <10 05/15/2019   ETH <10 05/07/2019    Metabolic Disorder Labs:  Lab Results  Component Value Date   HGBA1C 5.2 03/27/2018   MPG 102.54 03/27/2018   MPG 102.54 01/31/2017   No results found for: PROLACTIN Lab Results  Component Value Date   CHOL 193 03/27/2018   TRIG 154 (H) 03/27/2018   HDL 51 03/27/2018   CHOLHDL 3.8 03/27/2018   VLDL 31 03/27/2018   LDLCALC 111 (H) 03/27/2018   LDLCALC 102 (H) 01/31/2017    See Psychiatric Specialty Exam and Suicide Risk Assessment completed by Attending Physician prior to discharge.  Discharge destination:  Home  Is patient on multiple antipsychotic therapies at discharge:  No   Has Patient had three or more failed trials of antipsychotic monotherapy by history:  No  Recommended Plan for Multiple Antipsychotic  Therapies: NA  Discharge Instructions    Discharge instructions   Complete by: As directed    Please continue to take medications as directed. If your symptoms return, worsen, or persist please call your 911, report to local ER, or contact crisis hotline. Please do not drink alcohol or use any illegal substances while taking prescription medications.     Allergies as of 05/25/2019   No Known Allergies     Medication List    STOP taking these medications   diphenhydrAMINE 50 MG capsule Commonly known as: BENADRYL     TAKE these medications     Indication  benztropine 1 MG tablet Commonly known as: COGENTIN Take 1 tablet (1 mg total) by mouth 2 (two) times daily. What changed:   medication strength  how much to take  when to take this  Indication: Extrapyramidal Reaction caused by Medications   divalproex 500 MG 24 hr tablet Commonly known as: DEPAKOTE ER Take 1 tablet (500 mg total) by mouth at bedtime.  Indication: MIXED BIPOLAR AFFECTIVE DISORDER   hydrOXYzine 50 MG tablet Commonly known as: ATARAX/VISTARIL Take 1 tablet (50 mg total) by mouth every 6 (six) hours as needed for anxiety.  Indication: Feeling Anxious   Invega Sustenna 234 MG/1.5ML Susy injection Generic drug: paliperidone Inject 234 mg into the muscle every 30 (thirty) days.  Indication: Schizoaffective Disorder   traZODone 50 MG tablet Commonly known as: DESYREL Take 1 tablet (50 mg total) by mouth at bedtime as needed for sleep.  Indication: Trouble Sleeping      Follow-up Information    Services, Psychotherapeutic Follow up.   Contact information: [address] [place] [address] [phone]           Follow-up recommendations:  Activity:  Increase activity as tolerated Diet:  Routine diet as directed Tests:  Routine testing as suggested by outpatient psychiatrist. May need routine a1c, tsh, lipid panel, weights, and Depakote levels checked.  Other:  Even if you  begin to feel better continue taking your medications.   Comments:     Signed: Maryagnes Amos, FNP 05/25/2019, 10:19 AM  Review of Systems  Neurological: Negative.   Psychiatric/Behavioral: Negative.   All other systems reviewed and are negative.

## 2019-05-25 NOTE — Progress Notes (Addendum)
  [facility] Adult Case Management Discharge Plan :  Will you be returning to the same living situation after discharge:  Yes,  pt is returning to his home with his nephews. At discharge, do you have transportation home?: Yes,  ACT team will provide transportation.  Do you have the ability to pay for your medications: Yes,  [place] [employer].  Release of information consent forms completed and in the chart;  Patient's signature needed at discharge.  Patient to Follow up at: Follow-up Information    Services, Psychotherapeutic Follow up.   Contact information: [address] [place] [address] [phone] Your ACT team will follow up with you 05/25/2019 at 11:30AM.  Thanks!          Next level of care provider has access to [facility] Link:no  Safety Planning and Suicide Prevention discussed: Yes,  SPE completed with the patient, pt consented for family contact however, SPE attempts were unsuccessful  Have you used any form of tobacco in the last 30 days? (Cigarettes, Smokeless Tobacco, Cigars, and/or Pipes): Yes  Has patient been referred to the Quitline?: Patient refused referral  Patient has been referred for addiction treatment: Pt. refused referral  [name], LCSW 05/25/2019, 9:36 AM

## 2019-05-25 NOTE — Plan of Care (Signed)
Pt denies depression, anxiety, SI, HI and AVH. Pt was educated on care plan and verbalizes understanding. Torrie Mayers RN Problem: Education: Goal: Knowledge of [place] [facility] Education information/materials will improve Outcome: Adequate for Discharge Goal: Emotional status will improve Outcome: Adequate for Discharge Goal: Mental status will improve Outcome: Adequate for Discharge Goal: Verbalization of understanding the information provided will improve Outcome: Adequate for Discharge   Problem: Safety: Goal: Periods of time without injury will increase Outcome: Adequate for Discharge   Problem: Activity: Goal: Will verbalize the importance of balancing activity with adequate rest periods Outcome: Adequate for Discharge   Problem: Safety: Goal: Ability to redirect hostility and anger into socially appropriate behaviors will improve Outcome: Adequate for Discharge Goal: Ability to remain free from injury will improve Outcome: Adequate for Discharge

## 2019-05-25 NOTE — Progress Notes (Signed)
Patient alert and oriented x 4, affect is flat, he appears restless and apprehensive earlier in the shift he was angry he couldn't get his family member on the phone eventually he was able to speak with family member and he was less anxious. Patient's thoughts are disorganized he  appears responding to internal stimuli. 15 minutes safety checks maintained will continue to monitor closely

## 2019-05-25 NOTE — Plan of Care (Signed)
  Problem: Activity: Goal: Will verbalize the importance of balancing activity with adequate rest periods Outcome: Progressing  Patient verbalized the importance of activity and adequate rest he was noted sleeping after medication regimen.

## 2019-12-18 IMAGING — DX DG CHEST 1V PORT
1 series · 1 of 1 positions shown · non-contrast
Comparison: None.

CLINICAL DATA: Pt to be transferred. TB check

EXAM:
PORTABLE CHEST 1 VIEW

[chest ap]
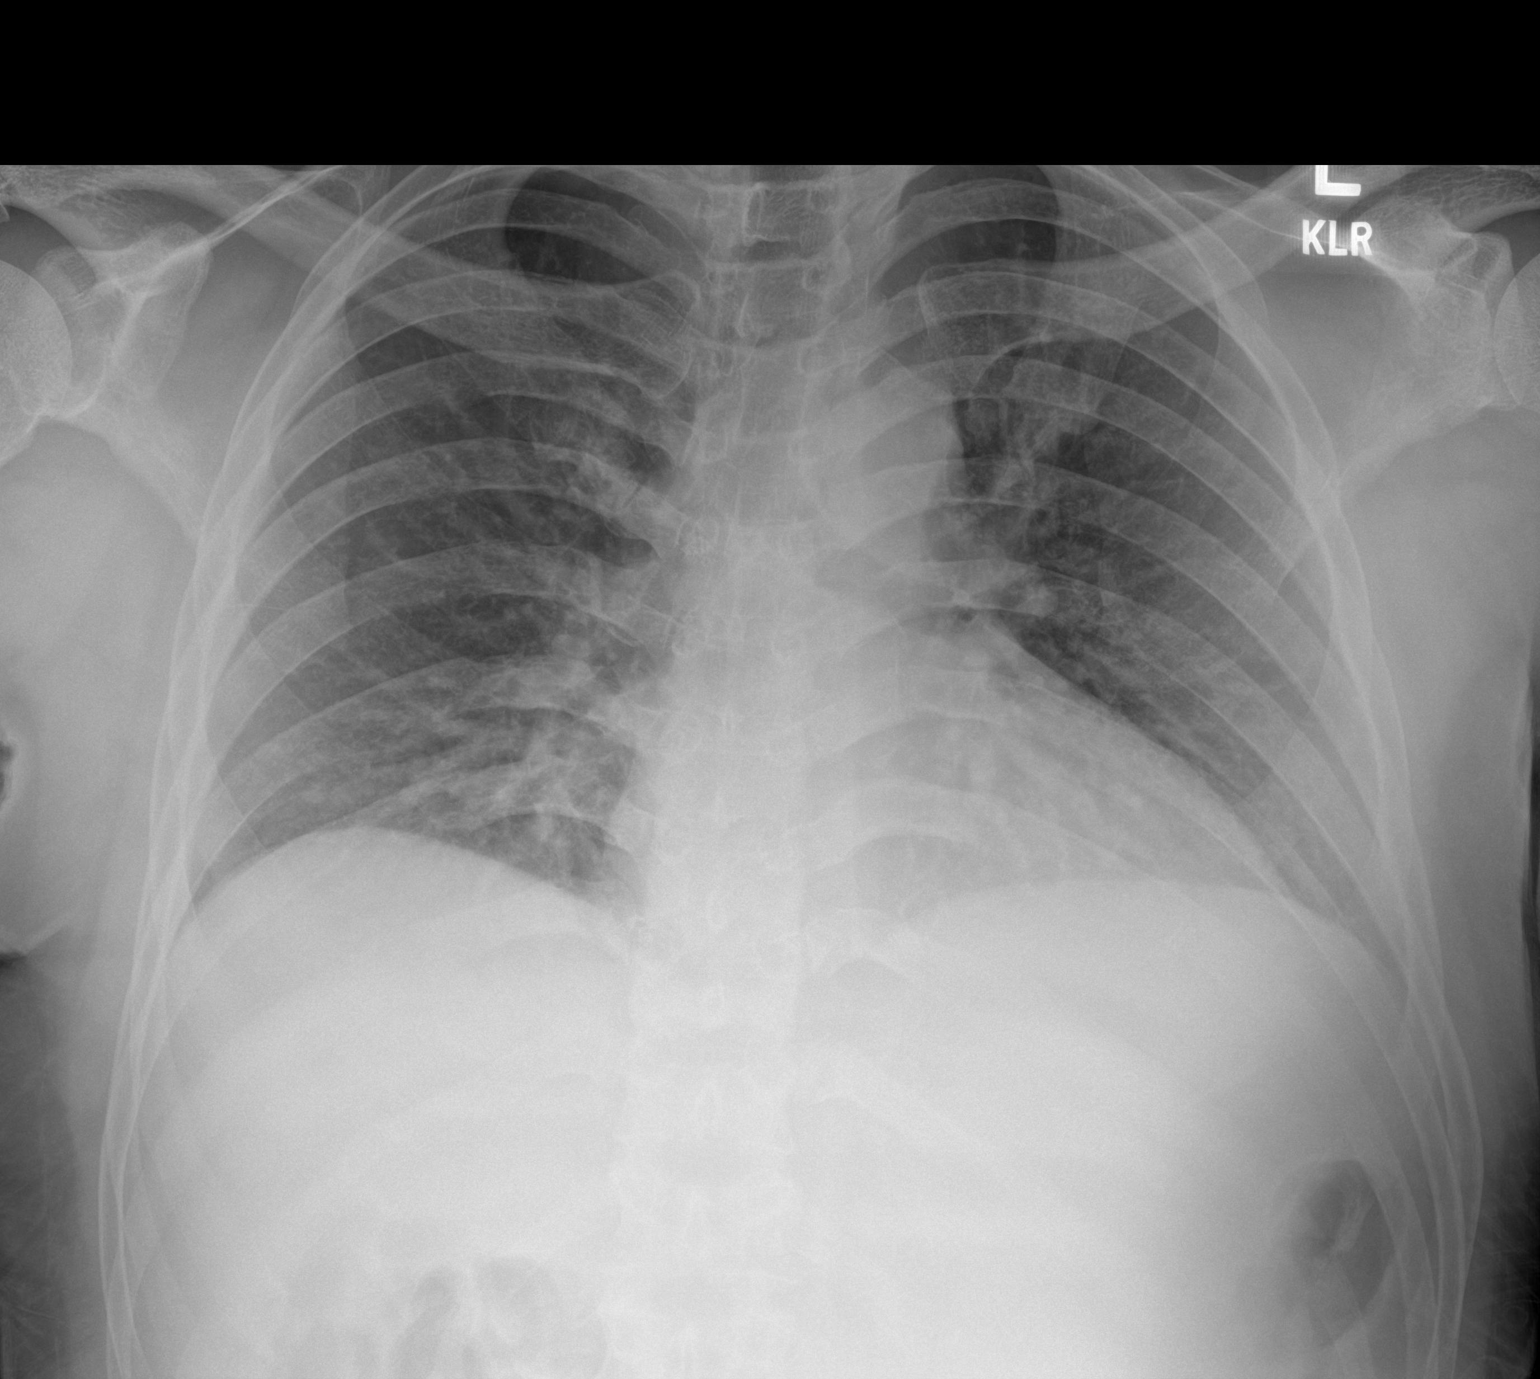

[1 of 1 positions shown; findings below may reference images not displayed]

FINDINGS: Shallow lung inflation and portable technique accentuate the
bronchovascular markings and heart size. No focal consolidations or
pleural effusions. No evidence for pulmonary edema. No obvious
adenopathy.
IMPRESSION: No active disease.

## 2020-01-12 ENCOUNTER — Encounter: Payer: Self-pay | Admitting: Emergency Medicine

## 2020-01-12 ENCOUNTER — Other Ambulatory Visit: Payer: Self-pay

## 2020-01-12 ENCOUNTER — Emergency Department
Admission: EM | Admit: 2020-01-12 | Discharge: 2020-01-12 | Disposition: A | Discharge status: Home / Self Care | Payer: Medicaid Other | Attending: Emergency Medicine | Admitting: Emergency Medicine

## 2020-01-12 DIAGNOSIS — Z1330 Encounter for screening examination for mental health and behavioral disorders, unspecified: Secondary | ICD-10-CM | POA: Diagnosis not present

## 2020-01-12 DIAGNOSIS — Z5321 Procedure and treatment not carried out due to patient leaving prior to being seen by health care provider: Secondary | ICD-10-CM | POA: Insufficient documentation

## 2020-01-12 NOTE — ED Notes (Signed)
Patient's sister notified this RN that she was leaving with the patient.

## 2020-01-12 NOTE — ED Notes (Signed)
Pt is here voluntary, denies any SI or HI.  Charge RN called and informed the patient is refusing blood draw at this time.  Per Consulting civil engineer, patient is to wait in the lobby for room availability and blood draw can occur once MD has talked with the patient.

## 2020-01-12 NOTE — ED Triage Notes (Signed)
Pt comes into the ED via POV with his sister c/o need for behavioral health evaluation.  Pt has been off of all BH medications for over 3 weeks and his MD informed his family that he needs to be evaluated today for possible inpatient in order to tweak his medications. Pt not able to follow directions at this time and is talking in circles.  Pt has even and unlabored respirations.  Pt denies any SI or HI. Pt's sleep pattern has been off as well per sister.

## 2020-02-23 ENCOUNTER — Other Ambulatory Visit: Payer: Self-pay

## 2020-02-23 ENCOUNTER — Emergency Department
Admission: EM | Admit: 2020-02-23 | Discharge: 2020-02-24 | Disposition: A | Discharge status: Home / Self Care | Payer: No Typology Code available for payment source | Attending: Student in an Organized Health Care Education/Training Program | Admitting: Student in an Organized Health Care Education/Training Program

## 2020-02-23 DIAGNOSIS — F209 Schizophrenia, unspecified: Secondary | ICD-10-CM | POA: Diagnosis present

## 2020-02-23 DIAGNOSIS — F17210 Nicotine dependence, cigarettes, uncomplicated: Secondary | ICD-10-CM | POA: Insufficient documentation

## 2020-02-23 DIAGNOSIS — R631 Polydipsia: Secondary | ICD-10-CM | POA: Diagnosis present

## 2020-02-23 DIAGNOSIS — F201 Disorganized schizophrenia: Secondary | ICD-10-CM

## 2020-02-23 DIAGNOSIS — Z046 Encounter for general psychiatric examination, requested by authority: Secondary | ICD-10-CM | POA: Diagnosis not present

## 2020-02-23 DIAGNOSIS — E162 Hypoglycemia, unspecified: Secondary | ICD-10-CM

## 2020-02-23 DIAGNOSIS — E871 Hypo-osmolality and hyponatremia: Secondary | ICD-10-CM | POA: Diagnosis present

## 2020-02-23 DIAGNOSIS — Z20822 Contact with and (suspected) exposure to covid-19: Secondary | ICD-10-CM | POA: Diagnosis not present

## 2020-02-23 LAB — URINALYSIS, COMPLETE (UACMP) WITH MICROSCOPIC
Bilirubin Urine: NEGATIVE
Glucose, UA: 50 mg/dL — AB
Hgb urine dipstick: NEGATIVE
Ketones, ur: NEGATIVE mg/dL
Leukocytes,Ua: NEGATIVE
Nitrite: NEGATIVE
Protein, ur: NEGATIVE mg/dL
Specific Gravity, Urine: 1.001 — ABNORMAL LOW (ref 1.005–1.030)
Squamous Epithelial / HPF: NONE SEEN (ref 0–5)
WBC, UA: NONE SEEN WBC/hpf (ref 0–5)
pH: 6 (ref 5.0–8.0)

## 2020-02-23 LAB — CBC WITH DIFFERENTIAL/PLATELET
Abs Immature Granulocytes: 0.01 10*3/uL (ref 0.00–0.07)
Basophils Absolute: 0 10*3/uL (ref 0.0–0.1)
Basophils Relative: 0 %
Eosinophils Absolute: 0.1 10*3/uL (ref 0.0–0.5)
Eosinophils Relative: 1 %
HCT: 33.6 % — ABNORMAL LOW (ref 39.0–52.0)
Hemoglobin: 11.7 g/dL — ABNORMAL LOW (ref 13.0–17.0)
Immature Granulocytes: 0 %
Lymphocytes Relative: 25 %
Lymphs Abs: 1.3 10*3/uL (ref 0.7–4.0)
MCH: 30.6 pg (ref 26.0–34.0)
MCHC: 34.8 g/dL (ref 30.0–36.0)
MCV: 88 fL (ref 80.0–100.0)
Monocytes Absolute: 0.5 10*3/uL (ref 0.1–1.0)
Monocytes Relative: 10 %
Neutro Abs: 3.3 10*3/uL (ref 1.7–7.7)
Neutrophils Relative %: 64 %
Platelets: 300 10*3/uL (ref 150–400)
RBC: 3.82 MIL/uL — ABNORMAL LOW (ref 4.22–5.81)
RDW: 11.5 % (ref 11.5–15.5)
WBC: 5.2 10*3/uL (ref 4.0–10.5)
nRBC: 0 % (ref 0.0–0.2)

## 2020-02-23 LAB — URINE DRUG SCREEN, QUALITATIVE (ARMC ONLY)
Amphetamines, Ur Screen: NOT DETECTED
Barbiturates, Ur Screen: NOT DETECTED
Benzodiazepine, Ur Scrn: NOT DETECTED
Cannabinoid 50 Ng, Ur ~~LOC~~: NOT DETECTED
Cocaine Metabolite,Ur ~~LOC~~: NOT DETECTED
MDMA (Ecstasy)Ur Screen: NOT DETECTED
Methadone Scn, Ur: NOT DETECTED
Opiate, Ur Screen: NOT DETECTED
Phencyclidine (PCP) Ur S: NOT DETECTED
Tricyclic, Ur Screen: NOT DETECTED

## 2020-02-23 LAB — COMPREHENSIVE METABOLIC PANEL
ALT: 15 U/L (ref 0–44)
AST: 33 U/L (ref 15–41)
Albumin: 3.5 g/dL (ref 3.5–5.0)
Alkaline Phosphatase: 77 U/L (ref 38–126)
Anion gap: 11 (ref 5–15)
BUN: 7 mg/dL (ref 6–20)
CO2: 23 mmol/L (ref 22–32)
Calcium: 8.3 mg/dL — ABNORMAL LOW (ref 8.9–10.3)
Chloride: 91 mmol/L — ABNORMAL LOW (ref 98–111)
Creatinine, Ser: 0.76 mg/dL (ref 0.61–1.24)
GFR, Estimated: >60 mL/min (ref >60)
Glucose, Bld: 67 mg/dL — ABNORMAL LOW (ref 70–99)
Potassium: 3.2 mmol/L — ABNORMAL LOW (ref 3.5–5.1)
Sodium: 125 mmol/L — ABNORMAL LOW (ref 135–145)
Total Bilirubin: 0.6 mg/dL (ref 0.3–1.2)
Total Protein: 7 g/dL (ref 6.5–8.1)

## 2020-02-23 LAB — CBG MONITORING, ED
Glucose-Capillary: 58 mg/dL — ABNORMAL LOW (ref 70–99)
Glucose-Capillary: 79 mg/dL (ref 70–99)
Glucose-Capillary: 97 mg/dL (ref 70–99)
Glucose-Capillary: 82 mg/dL (ref 70–99)

## 2020-02-23 LAB — TROPONIN I (HIGH SENSITIVITY): Troponin I (High Sensitivity): 5 ng/L (ref <18)

## 2020-02-23 LAB — SARS CORONAVIRUS 2 BY RT PCR (HOSPITAL ORDER, PERFORMED IN ~~LOC~~ HOSPITAL LAB): SARS Coronavirus 2: NEGATIVE

## 2020-02-23 LAB — VALPROIC ACID LEVEL: Valproic Acid Lvl: <10 ug/mL — ABNORMAL LOW (ref 50.0–100.0)

## 2020-02-23 MED ORDER — PALIPERIDONE ER 6 MG PO TB24
9.0000 mg | ORAL_TABLET | Freq: Every day | ORAL | Status: DC
  Filled 2020-02-23: qty 1

## 2020-02-23 MED ORDER — HALOPERIDOL LACTATE 5 MG/ML IJ SOLN
INTRAMUSCULAR | Status: AC
  Administered 2020-02-23: 5 mg via INTRAMUSCULAR
  Filled 2020-02-23: qty 1

## 2020-02-23 MED ORDER — DIVALPROEX SODIUM ER 250 MG PO TB24: 500.0000 mg | ORAL_TABLET | Freq: Every day | ORAL | Status: DC

## 2020-02-23 MED ORDER — DEXTROSE 50 % IV SOLN
INTRAVENOUS | Status: AC
  Administered 2020-02-23: 50 mL via INTRAVENOUS
  Filled 2020-02-23: qty 50

## 2020-02-23 MED ORDER — DEXTROSE 50 % IV SOLN
1.0000 | Freq: Once | INTRAVENOUS | Status: AC
  Administered 2020-02-23: 50 mL via INTRAVENOUS
  Filled 2020-02-23: qty 50

## 2020-02-23 MED ORDER — DIPHENHYDRAMINE HCL 50 MG/ML IJ SOLN: 12.5000 mg | Freq: Once | INTRAMUSCULAR | Status: AC

## 2020-02-23 MED ORDER — LORAZEPAM 2 MG/ML IJ SOLN
INTRAMUSCULAR | Status: AC
  Administered 2020-02-23: 2 mg via INTRAMUSCULAR
  Filled 2020-02-23: qty 1

## 2020-02-23 MED ORDER — LORAZEPAM 2 MG/ML IJ SOLN: 2.0000 mg | Freq: Once | INTRAMUSCULAR | Status: AC

## 2020-02-23 MED ORDER — PALIPERIDONE ER 3 MG PO TB24
9.0000 mg | ORAL_TABLET | Freq: Every day | ORAL | Status: DC
  Filled 2020-02-23: qty 3

## 2020-02-23 MED ORDER — DEXTROSE 50 % IV SOLN: 50.0000 mL | Freq: Once | INTRAVENOUS | Status: AC

## 2020-02-23 MED ORDER — MIDAZOLAM HCL 5 MG/5ML IJ SOLN: 4.0000 mg | Freq: Once | INTRAMUSCULAR | Status: DC

## 2020-02-23 MED ORDER — HALOPERIDOL LACTATE 5 MG/ML IJ SOLN: 5.0000 mg | Freq: Once | INTRAMUSCULAR | Status: AC

## 2020-02-23 MED ORDER — DIPHENHYDRAMINE HCL 50 MG/ML IJ SOLN
INTRAMUSCULAR | Status: AC
  Administered 2020-02-23: 12.5 mg via INTRAMUSCULAR
  Filled 2020-02-23: qty 1

## 2020-02-23 MED ORDER — MIDAZOLAM HCL 5 MG/5ML IJ SOLN: 2.0000 mg | Freq: Once | INTRAMUSCULAR | Status: DC

## 2020-02-23 NOTE — ED Notes (Signed)
Pt CBG 58. [place], MD informed and Mimi, RN told to push another amp of D50.

## 2020-02-23 NOTE — ED Notes (Signed)
Hourly rounding completed at this time, patient currently asleep in room. No complaints, stable, and in no acute distress. Q15 minute rounds and monitoring via Rover and Officer to continue. 

## 2020-02-23 NOTE — ED Notes (Signed)
IVC PENDING  CONSULT ?

## 2020-02-23 NOTE — ED Provider Notes (Signed)
[facility] Emergency Department Provider Note   ____________________________________________   Event Date/Time   First MD Initiated Contact with Patient 02/23/20 1344     (approximate)  I have reviewed the triage vital signs and the nursing notes.   HISTORY  Chief Complaint Psychiatric Evaluation    HPI Shane Nash is a 40 y.o. male who was brought in by his sister.  Sister reports he is schizophrenic and has been off his medicine for months.  Patient is being urged along by his sister and has several times changes his mind and tries to go back but his sister schedules him and convince him to go further.  I end up assisting with this process as did several nurses and security.  Gently talked him into going into room 22.  He is talking about God and he has to go back to church and he wants to leave here and go back to church and he wants to drink water but dropped his cup on the floor and spills at all over the front of his chest as well.  Sister reports she is not been sleeping over the last 3 days and was messing with his feces.  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 Schizophrenia [facility])     Patient Active Problem List   Diagnosis Date Noted  . Tardive dyskinesia 05/18/2019  . Disorganized behavior 05/17/2019  . Tobacco use disorder 03/26/2018  . 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 Schizophrenia (HCC) 01/27/2017  . Hyponatremia 01/27/2017  . Polydipsia 01/27/2017    History reviewed. No pertinent surgical history.  Prior to Admission medications   Medication Sig Start Date End Date Taking? Authorizing Provider  benztropine (COGENTIN) 1 MG tablet Take 1 tablet (1 mg total) by mouth 2 (two) times daily. 05/25/19   Starkes-Perry, Juel Burrow, FNP  divalproex (DEPAKOTE ER) 500 MG 24 hr tablet Take 1 tablet (500 mg total) by mouth at bedtime. 05/25/19   Starkes-Perry, Juel Burrow,  FNP  hydrOXYzine (ATARAX/VISTARIL) 50 MG tablet Take 1 tablet (50 mg total) by mouth every 6 (six) hours as needed for anxiety. 05/25/19   Starkes-Perry, Juel Burrow, FNP  INVEGA SUSTENNA 234 MG/1.5ML SUSY injection Inject 234 mg into the muscle every 30 (thirty) days. 05/25/19   Starkes-Perry, Juel Burrow, FNP  traZODone (DESYREL) 50 MG tablet Take 1 tablet (50 mg total) by mouth at bedtime as needed for sleep. 05/25/19   Maryagnes Amos, FNP    Allergies Patient has no known allergies.  No family history on file.  Social History Social History   Tobacco Use  . Smoking status: Current Every Day Smoker    Packs/day: 0.25    Years: 15.00    Pack years: 3.75    Types: Cigarettes  . Smokeless tobacco: Never Used  . Tobacco comment: Cannot state the amount he smokes per day  Vaping Use  . Vaping Use: Never used  Substance Use Topics  . Drug use: No    Review of Systems Difficult to obtain but is near as I can tell Constitutional: No fever/chills Eyes: No visual changes. ENT: No sore throat. Cardiovascular: Denies chest pain. Respiratory: Denies shortness of breath. Gastrointestinal: No abdominal pain.  No nausea, no vomiting.  No diarrhea.  No constipation. Genitourinary: Negative for dysuria. Musculoskeletal: Negative for back pain. Skin: Negative for rash. Neurological: Negative for headaches, focal weakness   ____________________________________________  PHYSICAL EXAM:  VITAL SIGNS: ED Triage Vitals  Enc Vitals Group     BP 02/23/20 1402 (!) 160/100     Pulse Rate 02/23/20 1402 81     Resp 02/23/20 1402 (!) 24     Temp --      Temp src --      SpO2 02/23/20 1402 95 %     Weight 02/23/20 1407 170 lb (77.1 kg)     Height 02/23/20 1407 5\' 11"  (1.803 m)     Head Circumference --      Peak Flow --      Pain Score 02/23/20 1402 0     Pain Loc --      Pain Edu? --      Excl. in GC? --     Constitutional: Alert but confused gentleman who is walking back and forth  in the emergency room in a disorganized manner.  He was going along well until we approached bed 19 hallway at which time he got scared and tried to go back.  We had to encourage him further and in the bringing him through the nurses station which was less scary to him.  There he stopped and by this time he had papers signed and so we gave him Haldol and Ativan IM.  After a few minutes patient became a little unsteady on his feet but we were able to guide him into his room.  The patient continued to say he wanted to go to church.  Felicia who knows him brought a viable up to him and was able that way to get him to go into room 22. Eyes: Conjunctivae are normal.  Head: Atraumatic. Nose: No congestion/rhinnorhea. Mouth/Throat: Mucous membranes are moist.   Neck: No stridor.  Cardiovascular: Normal rate, regular rhythm. Grossly normal heart sounds.  Good peripheral circulation. Respiratory: Normal respiratory effort.  No retractions. Lungs CTAB. Gastrointestinal: Soft and nontender. No distention. No abdominal bruits.  Musculoskeletal: No lower extremity tenderness nor edema.   Neurologic:  Normal speech and language. No gross focal neurologic deficits are appreciated.  Until he got the Haldol and Ativan he had no gait instability. Skin:  Skin is warm, dry and intact.    ____________________________________________   LABS (all labs ordered are listed, but only abnormal results are displayed)  Labs Reviewed  SARS CORONAVIRUS 2 BY RT PCR (HOSPITAL ORDER, PERFORMED IN [facility])  CBC WITH DIFFERENTIAL/PLATELET  COMPREHENSIVE METABOLIC PANEL  VALPROIC ACID LEVEL   ____________________________________________  EKG   ____________________________________________  RADIOLOGY 02/25/20, personally viewed and evaluated these images (plain radiographs) as part of my medical decision making, as well as reviewing the written report by the radiologist.  ED MD interpretation:     Official radiology report(s): No results found.  ____________________________________________   PROCEDURES  Procedure(s) performed (including Critical Care):  Procedures   ____________________________________________   INITIAL IMPRESSION / ASSESSMENT AND PLAN / ED COURSE  Labs are pending at this time.  I have consulted psychiatry and TTS.  Patient may need further medication but currently he is calm in the room with his sister who now has the Bible.              ____________________________________________   FINAL CLINICAL IMPRESSION(S) / ED DIAGNOSES  Final diagnoses:  Other schizophrenia (HCC)  Disorganized schizophrenia [facility])     ED Discharge Orders    None      *Please note:  Shane Nash was  evaluated in Emergency Department on 02/23/2020 for the symptoms described in the history of present illness. He was evaluated in the context of the global COVID-19 pandemic, which necessitated consideration that the patient might be at risk for infection with the SARS-CoV-2 virus that causes COVID-19. Institutional protocols and algorithms that pertain to the evaluation of patients at risk for COVID-19 are in a state of rapid change based on information released by regulatory bodies including the CDC and federal and state organizations. These policies and algorithms were followed during the patient's care in the ED.  Some ED evaluations and interventions may be delayed as a result of limited staffing during and the pandemic.*   Note:  This document was prepared using Dragon voice recognition software and may include unintentional dictation errors.    Arnaldo Natal, MD 02/23/20 [phone]

## 2020-02-23 NOTE — ED Notes (Signed)
Pt is on monitor at this time

## 2020-02-23 NOTE — ED Notes (Signed)
Pt remains asleep during blood draw and sugar check. resp even and unlabored.

## 2020-02-23 NOTE — Consult Note (Signed)
[facility] Face-to-Face Psychiatry Consult   Reason for Consult: Consult for 40 year old man with a history of schizophrenia brought to the hospital by his sister Referring Physician: Su Hoff Patient Identification: RUEL Nash MRN:  [id number] Principal Diagnosis: Schizophrenia (HCC) Diagnosis:  Principal Problem:   Schizophrenia (HCC) Active Problems:   Hyponatremia   Polydipsia   Hypoglycemia   Total Time spent with patient: 1 hour  Subjective:   Shane Nash is a 40 y.o. male patient admitted with patient currently not able to give any history.  HPI: Patient seen chart reviewed.  History obtained from the chart and labs and the old chart.  He was brought to the hospital by his sister reportedly with confusion poor self-care disorganized behavior.  Sister is reported to have said he has not been on his psychiatric medicine in months.  Patient was resistant and agitated coming into the hospital.  Labs show that he is hyponatremic.  He also has been urinating very large volumes and is hypoglycemic all consistent with his history of water intoxication.  Patient is currently sedated and unable to give any history himself  Past Psychiatric History: Past history of schizophrenia with disorganized behavior bizarre behavior bordering on delirium at times.  Complicated by water intoxication  Risk to Self:   Risk to Others:   Prior Inpatient Therapy:   Prior Outpatient Therapy:    Past Medical History:  Past Medical History:  Diagnosis Date  . Schizophrenia (HCC)    History reviewed. No pertinent surgical history. Family History: No family history on file. Family Psychiatric  History: See previous Social History:  Social History   Substance and Sexual Activity  Alcohol Use None     Social History   Substance and Sexual Activity  Drug Use No    Social History   Socioeconomic History  . Marital status: Single    Spouse name: Not on file  . Number of children: Not on file  .  Years of education: Not on file  . Highest education level: Not on file  Occupational History  . Not on file  Tobacco Use  . Smoking status: Current Every Day Smoker    Packs/day: 0.25    Years: 15.00    Pack years: 3.75    Types: Cigarettes  . Smokeless tobacco: Never Used  . Tobacco comment: Cannot state the amount he smokes per day  Vaping Use  . Vaping Use: Never used  Substance and Sexual Activity  . Alcohol use: Not on file  . Drug use: No  . Sexual activity: Not Currently  Other Topics Concern  . Not on file  Social History Narrative  . Not on file   Social Determinants of Health   Financial Resource Strain: Not on file  Food Insecurity: Not on file  Transportation Needs: Not on file  Physical Activity: Not on file  Stress: Not on file  Social Connections: Not on file   Additional Social History:    Allergies:  No Known Allergies  Labs:  Results for orders placed or performed during the hospital encounter of 02/23/20 (from the past 48 hour(s))  CBC with Differential     Status: Abnormal   Collection Time: 02/23/20  2:25 PM  Result Value Ref Range   WBC 5.2 4.0 - 10.5 K/uL   RBC 3.82 (L) 4.22 - 5.81 MIL/uL   Hemoglobin 11.7 (L) 13.0 - 17.0 g/dL   HCT 83.1 (L) 51.7 - 61.6 %   MCV 88.0 80.0 - 100.0  fL   MCH 30.6 26.0 - 34.0 pg   MCHC 34.8 30.0 - 36.0 g/dL   RDW 16.1 09.6 - 04.5 %   Platelets 300 150 - 400 K/uL   nRBC 0.0 0.0 - 0.2 %   Neutrophils Relative % 64 %   Neutro Abs 3.3 1.7 - 7.7 K/uL   Lymphocytes Relative 25 %   Lymphs Abs 1.3 0.7 - 4.0 K/uL   Monocytes Relative 10 %   Monocytes Absolute 0.5 0.1 - 1.0 K/uL   Eosinophils Relative 1 %   Eosinophils Absolute 0.1 0.0 - 0.5 K/uL   Basophils Relative 0 %   Basophils Absolute 0.0 0.0 - 0.1 K/uL   Immature Granulocytes 0 %   Abs Immature Granulocytes 0.01 0.00 - 0.07 K/uL    Comment: Performed at [facility], [address] [place], [place] [address]  Comprehensive metabolic panel      Status: Abnormal   Collection Time: 02/23/20  2:25 PM  Result Value Ref Range   Sodium 125 (L) 135 - 145 mmol/L   Potassium 3.2 (L) 3.5 - 5.1 mmol/L   Chloride 91 (L) 98 - 111 mmol/L   CO2 23 22 - 32 mmol/L   Glucose, Bld 67 (L) 70 - 99 mg/dL    Comment: Glucose reference range applies only to samples taken after fasting for at least 8 hours.   BUN 7 6 - 20 mg/dL   Creatinine, Ser 1.91 0.61 - 1.24 mg/dL   Calcium 8.3 (L) 8.9 - 10.3 mg/dL   Total Protein 7.0 6.5 - 8.1 g/dL   Albumin 3.5 3.5 - 5.0 g/dL   AST 33 15 - 41 U/L   ALT 15 0 - 44 U/L   Alkaline Phosphatase 77 38 - 126 U/L   Total Bilirubin 0.6 0.3 - 1.2 mg/dL   GFR, Estimated >47 >82 mL/min    Comment: (NOTE) Calculated using the CKD-EPI Creatinine Equation (2021)    Anion gap 11 5 - 15    Comment: Performed at [facility], [address] [place], [place] [address]  SARS Coronavirus 2 by RT PCR (hospital order, performed in [facility] hospital lab) Nasopharyngeal Nasopharyngeal Swab     Status: None   Collection Time: 02/23/20  2:26 PM   Specimen: Nasopharyngeal Swab  Result Value Ref Range   SARS Coronavirus 2 NEGATIVE NEGATIVE    Comment: (NOTE) SARS-CoV-2 target nucleic acids are NOT DETECTED.  The SARS-CoV-2 RNA is generally detectable in upper and lower respiratory specimens during the acute phase of infection. The lowest concentration of SARS-CoV-2 viral copies this assay can detect is 250 copies / mL. A negative result does not preclude SARS-CoV-2 infection and should not be used as the sole basis for treatment or other patient management decisions.  A negative result may occur with improper specimen collection / handling, submission of specimen other than nasopharyngeal swab, presence of viral mutation(s) within the areas targeted by this assay, and inadequate number of viral copies (<250 copies / mL). A negative result must be combined with clinical observations, patient history, and  epidemiological information.  Fact Sheet for Patients:   BoilerBrush.com.cy  Fact Sheet for Healthcare Providers: https://pope.com/  This test is not yet approved or  cleared by the [place] FDA and has been authorized for detection and/or diagnosis of SARS-CoV-2 by FDA under an Emergency Use Authorization (EUA).  This EUA will remain in effect (meaning this test can be used) for the duration of the COVID-19  declaration under Section 564(b)(1) of the Act, 21 U.S.C. section 360bbb-3(b)(1), unless the authorization is terminated or revoked sooner.  Performed at [facility], [address] [place], [place] [address]   Valproic acid level     Status: Abnormal   Collection Time: 02/23/20  2:26 PM  Result Value Ref Range   Valproic Acid Lvl <10 (L) 50.0 - 100.0 ug/mL    Comment: RESULT CONFIRMED BY MANUAL DILUTION DB Performed at [facility], [address] [place], [place] [address]   CBG monitoring, ED     Status: Abnormal   Collection Time: 02/23/20  4:39 PM  Result Value Ref Range   Glucose-Capillary 58 (L) 70 - 99 mg/dL    Comment: Glucose reference range applies only to samples taken after fasting for at least 8 hours.   Comment 1 Notify RN    Comment 2 Document in Chart   Urine Drug Screen, Qualitative ([facility] only)     Status: None   Collection Time: 02/23/20  5:28 PM  Result Value Ref Range   Tricyclic, Ur Screen NONE DETECTED NONE DETECTED   Amphetamines, Ur Screen NONE DETECTED NONE DETECTED   MDMA (Ecstasy)Ur Screen NONE DETECTED NONE DETECTED   Cocaine Metabolite,Ur [place] NONE DETECTED NONE DETECTED   Opiate, Ur Screen NONE DETECTED NONE DETECTED   Phencyclidine (PCP) Ur S NONE DETECTED NONE DETECTED   Cannabinoid 50 Ng, Ur [place] NONE DETECTED NONE DETECTED   Barbiturates, Ur Screen NONE DETECTED NONE DETECTED   Benzodiazepine, Ur Scrn NONE DETECTED NONE DETECTED   Methadone Scn, Ur NONE DETECTED  NONE DETECTED    Comment: (NOTE) Tricyclics + metabolites, urine    Cutoff 1000 ng/mL Amphetamines + metabolites, urine  Cutoff 1000 ng/mL MDMA (Ecstasy), urine              Cutoff 500 ng/mL Cocaine Metabolite, urine          Cutoff 300 ng/mL Opiate + metabolites, urine        Cutoff 300 ng/mL Phencyclidine (PCP), urine         Cutoff 25 ng/mL Cannabinoid, urine                 Cutoff 50 ng/mL Barbiturates + metabolites, urine  Cutoff 200 ng/mL Benzodiazepine, urine              Cutoff 200 ng/mL Methadone, urine                   Cutoff 300 ng/mL  The urine drug screen provides only a preliminary, unconfirmed analytical test result and should not be used for non-medical purposes. Clinical consideration and professional judgment should be applied to any positive drug screen result due to possible interfering substances. A more specific alternate chemical method must be used in order to obtain a confirmed analytical result. Gas chromatography / mass spectrometry (GC/MS) is the preferred confirm atory method. Performed at [facility], [address] [place], [place] [address]   Urinalysis, Complete w Microscopic Urine, Clean Catch     Status: Abnormal   Collection Time: 02/23/20  5:29 PM  Result Value Ref Range   Color, Urine STRAW (A) YELLOW   APPearance CLEAR (A) CLEAR   Specific Gravity, Urine 1.001 (L) 1.005 - 1.030   pH 6.0 5.0 - 8.0   Glucose, UA 50 (A) NEGATIVE mg/dL   Hgb urine dipstick NEGATIVE NEGATIVE   Bilirubin Urine NEGATIVE NEGATIVE   Ketones, ur NEGATIVE NEGATIVE mg/dL  Protein, ur NEGATIVE NEGATIVE mg/dL   Nitrite NEGATIVE NEGATIVE   Leukocytes,Ua NEGATIVE NEGATIVE   RBC / HPF 0-5 0 - 5 RBC/hpf   WBC, UA NONE SEEN 0 - 5 WBC/hpf   Bacteria, UA RARE (A) NONE SEEN   Squamous Epithelial / LPF NONE SEEN 0 - 5    Comment: Performed at [facility], [address] [place], [place] [address]  CBG monitoring, ED     Status: None   Collection  Time: 02/23/20  5:56 PM  Result Value Ref Range   Glucose-Capillary 79 70 - 99 mg/dL    Comment: Glucose reference range applies only to samples taken after fasting for at least 8 hours.   Comment 1 Notify RN    Comment 2 Document in Chart     Current Facility-Administered Medications  Medication Dose Route Frequency Provider Last Rate Last Admin  . divalproex (DEPAKOTE ER) 24 hr tablet 500 mg  500 mg Oral QHS [name], [name], MD      . paliperidone (INVEGA) 24 hr tablet 9 mg  9 mg Oral QHS [name], Jackquline DenmarkJohn T, MD       Current Outpatient Medications  Medication Sig Dispense Refill  . benztropine (COGENTIN) 1 MG tablet Take 1 tablet (1 mg total) by mouth 2 (two) times daily. 60 tablet 0  . divalproex (DEPAKOTE ER) 500 MG 24 hr tablet Take 1 tablet (500 mg total) by mouth at bedtime. 30 tablet 0  . hydrOXYzine (ATARAX/VISTARIL) 50 MG tablet Take 1 tablet (50 mg total) by mouth every 6 (six) hours as needed for anxiety. 30 tablet 0  . INVEGA SUSTENNA 234 MG/1.5ML SUSY injection Inject 234 mg into the muscle every 30 (thirty) days. 1.8 mL 0  . traZODone (DESYREL) 50 MG tablet Take 1 tablet (50 mg total) by mouth at bedtime as needed for sleep. 30 tablet 0    Musculoskeletal: Strength & Muscle Tone: within normal limits Gait & Station: unsteady Patient leans: N/A  Psychiatric Specialty Exam: Physical Exam Vitals and nursing note reviewed.  Constitutional:      Appearance: He is well-developed and well-nourished.  HENT:     Head: Normocephalic and atraumatic.  Eyes:     Conjunctiva/sclera: Conjunctivae normal.     Pupils: Pupils are equal, round, and reactive to light.  Cardiovascular:     Heart sounds: Normal heart sounds.  Pulmonary:     Effort: Pulmonary effort is normal.  Abdominal:     Palpations: Abdomen is soft.  Musculoskeletal:        General: Normal range of motion.     Cervical back: Normal range of motion.  Skin:    General: Skin is warm and dry.  Neurological:      General: No focal deficit present.     Mental Status: He is alert.  Psychiatric:        Attention and Perception: He is inattentive.        Speech: He is noncommunicative.     Review of Systems  Unable to perform ROS: Patient unresponsive    Blood pressure (!) 160/100, pulse 81, resp. rate (!) 24, height 5\' 11"  (1.803 m), weight 77.1 kg, SpO2 95 %.Body mass index is 23.71 kg/m.  General Appearance: Disheveled  Eye Contact:  None  Speech:  Negative  Volume:  Decreased  Mood:  Negative  Affect:  Negative  Thought Process:  Disorganized  Orientation:  Negative  Thought Content:  Negative  Suicidal Thoughts:  No  Homicidal  Thoughts:  No  Memory:  Negative  Judgement:  Negative  Insight:  Negative  Psychomotor Activity:  Negative  Concentration:  Concentration: Negative  Recall:  Negative  Fund of Knowledge:  Negative  Language:  Negative  Akathisia:  Negative  Handed:  Right  AIMS (if indicated):     Assets:  Resilience  ADL's:  Impaired  Cognition:  Impaired,  Severe  Sleep:        Treatment Plan Summary: Daily contact with patient to assess and evaluate symptoms and progress in treatment, Medication management and Plan 40 year old man with schizophrenia off his medication with bizarre agitated behavior.  Playing in his feces.  Almost delirious.  Had to be sedated and now is unresponsive.  Labs and behavior suggest that he has probably been water intoxicating again.  He has been given several ampules of D50 because of his persistently low glucose.  Hopefully that will stabilize out as he finishes urinating off a lot of the water that he has overindulged and.  Decision might be made to admit him to the medical ward.  I have gone ahead and reorder the Depakote and Invega that he has been on in the past.  Not sure if he will be compliant with it but we will have the order in for now.  We will continue to follow-up whether he is in the ER or on medicine.  I tried to reach his  sister for collateral history but the only phone number I could find is disconnected.  Disposition: Recommend psychiatric Inpatient admission when medically cleared.  Mordecai Rasmussen, MD 02/23/2020 6:13 PM

## 2020-02-23 NOTE — ED Provider Notes (Signed)
-----------------------------------------   3:16 PM on 02/23/2020 -----------------------------------------  Blood pressure (!) 160/100, pulse 81, resp. rate (!) 24, height 5\' 11"  (1.803 m), weight 77.1 kg, SpO2 95 %.  Assuming care from Dr. .  In short, Shane Nash is a 40 y.o. male with a chief complaint of Psychiatric Evaluation .  Refer to the original H&P for additional details.  The current plan of care is to recheck glucose after amp of D50, follow-up psychiatry recommendations.  ----------------------------------------- 6:23 PM on 02/23/2020 -----------------------------------------  ED ECG REPORT I, 02/25/2020, the attending physician, personally viewed and interpreted this ECG.   Date: 02/23/2020  EKG Time: 18:20  Rate: 67  Rhythm: normal sinus rhythm  Axis: Normal  Intervals:none  ST&T Change: J point elevation in lead II without reciprocal changes  Patient continues to sleep comfortably following calming medications.  He has urinated on himself multiple times with a large amount of urine that is clear with very low spec graph.  Patient with noted history of polydipsia and I suspect his hyponatremia today is related to this.  We will fluid restrict for now and blood glucose is also improved after amp of D50 x2.  Patient was given some orange juice and we will continue to check blood glucose.  If glucose levels remained stable, patient will be appropriate for psychiatric disposition.  ----------------------------------------- 11:43 PM on 02/23/2020 -----------------------------------------  Blood glucose remains improved after patient was able to drink some orange juice. Orders placed for recheck of BMP in the morning to ensure sodium levels improving with fluid restriction.  The patient has been placed in psychiatric observation due to the need to provide a safe environment for the patient while obtaining psychiatric consultation and evaluation, as well as  ongoing medical and medication management to treat the patient's condition.  The patient has been placed under full IVC at this time.      02/25/2020, MD 02/23/20 [id number]    02/25/20, MD 02/24/20 [phone]

## 2020-02-23 NOTE — ED Triage Notes (Signed)
BIB sister from home who states pt has schizophrenia and has been off of his mediations for months. Pt arrives appearing fearful and guarded. Pt has allegedly not been sleeping over past 3 days and was placing in his feces this morning.

## 2020-02-23 NOTE — BH Assessment (Addendum)
TTS attempted to complete assessment but was unsuccessful due to pt's current RN (Amy L.) reporting pt receiving IM's and to be currently sleeping. TTS will follow up with pt accordingly.

## 2020-02-23 NOTE — ED Notes (Signed)
Pt remains asleep at this time 

## 2020-02-23 NOTE — ED Notes (Signed)
This tech, Mimi,RN and Amy, RN provided pt with peri care and linen change. A dry brief and new beh health scrubs provided. Male purewick placed. Pt is on the monitor. CBG checked.

## 2020-02-23 NOTE — ED Notes (Addendum)
Pt sleeping when not being verbally aroused.  Changed into policy clothing by this RN and Sunny Schlein, ED Tech. Black pants, black shirt and shoes placed into labeled belonging bag 1 of 1.  Pt jumped up when COVID swabbed, given verbal redirection to get back into the bed and pt does without any difficulties.  Dr. Toni Amend at bedside.

## 2020-02-23 NOTE — ED Notes (Addendum)
This RN, Kristian Covey, RN, and Industrial/product designer had to change pt bed linen and pt clothes twice. Pt urinated on himself twice. Pt is now resting and comfortable. Placed a brief on pt as well. Ally, RN placed an IV in pt left AC.

## 2020-02-23 NOTE — ED Notes (Signed)
Pt walking back and forth to and from bathroom to room 22. Sister at bedside.

## 2020-02-23 NOTE — ED Notes (Signed)
Report received from Amy, RN. Patient currently sleeping, respirations regular and unlabored. Q15 minute rounds and observation by Rover and Officer to continue. Will assess patient once awake. 

## 2020-02-23 NOTE — ED Notes (Addendum)
Pt being walked back to room 22A by Aundra Millet, RN, sister and Dr. Darnelle Catalan. Pt uncooperatiev with walking down hallways, appears fearful and mumbling words regarding "God" and "no money". Despite multiple attempts to calm down patient he is unwilling to walk to treatment room. Pt standing in nurses station with staff at bedside. Emotional support given. Pt willingly takes IM medications after being told that we are going to give him medications to aid with his anxiety. Pt took IM injections without incident. Pt then walked to treatment room by staff. Given water as requested. Emergency IVC paperwork taken out by EDP.

## 2020-02-23 NOTE — ED Notes (Signed)
Will attempt for bloodwork, EKG and COVID test when pt is calm. Pt currently pacing in room. Sister at bedside.

## 2020-02-23 NOTE — ED Notes (Signed)
Unable to do shift psych assessment because pt is asleep

## 2020-02-24 ENCOUNTER — Other Ambulatory Visit: Payer: Self-pay

## 2020-02-24 ENCOUNTER — Encounter: Payer: Self-pay | Admitting: Psychiatry

## 2020-02-24 ENCOUNTER — Inpatient Hospital Stay
Admission: AD | Admit: 2020-02-24 | Discharge: 2020-03-02 | DRG: 885 | Disposition: A | Discharge status: Home / Self Care | Payer: No Typology Code available for payment source | Source: Intra-hospital | Attending: Behavioral Health | Admitting: Behavioral Health

## 2020-02-24 DIAGNOSIS — F201 Disorganized schizophrenia: Principal | ICD-10-CM | POA: Diagnosis present

## 2020-02-24 DIAGNOSIS — F17210 Nicotine dependence, cigarettes, uncomplicated: Secondary | ICD-10-CM | POA: Diagnosis present

## 2020-02-24 DIAGNOSIS — G4700 Insomnia, unspecified: Secondary | ICD-10-CM | POA: Diagnosis present

## 2020-02-24 DIAGNOSIS — Z79899 Other long term (current) drug therapy: Secondary | ICD-10-CM

## 2020-02-24 DIAGNOSIS — F17200 Nicotine dependence, unspecified, uncomplicated: Secondary | ICD-10-CM | POA: Diagnosis present

## 2020-02-24 DIAGNOSIS — F209 Schizophrenia, unspecified: Secondary | ICD-10-CM | POA: Diagnosis not present

## 2020-02-24 DIAGNOSIS — R4689 Other symptoms and signs involving appearance and behavior: Secondary | ICD-10-CM | POA: Diagnosis present

## 2020-02-24 DIAGNOSIS — R631 Polydipsia: Secondary | ICD-10-CM | POA: Diagnosis present

## 2020-02-24 DIAGNOSIS — F3160 Bipolar disorder, current episode mixed, unspecified: Secondary | ICD-10-CM | POA: Diagnosis present

## 2020-02-24 LAB — BASIC METABOLIC PANEL
Anion gap: 8 (ref 5–15)
BUN: 8 mg/dL (ref 6–20)
CO2: 28 mmol/L (ref 22–32)
Calcium: 8.8 mg/dL — ABNORMAL LOW (ref 8.9–10.3)
Chloride: 106 mmol/L (ref 98–111)
Creatinine, Ser: 0.96 mg/dL (ref 0.61–1.24)
GFR, Estimated: >60 mL/min (ref >60)
Glucose, Bld: 93 mg/dL (ref 70–99)
Potassium: 4.4 mmol/L (ref 3.5–5.1)
Sodium: 142 mmol/L (ref 135–145)

## 2020-02-24 MED ORDER — ACETAMINOPHEN 325 MG PO TABS: 650.0000 mg | ORAL_TABLET | Freq: Four times a day (QID) | ORAL | Status: DC | PRN

## 2020-02-24 MED ORDER — DIVALPROEX SODIUM ER 500 MG PO TB24
500.0000 mg | ORAL_TABLET | Freq: Every day | ORAL | Status: DC
  Administered 2020-02-24 – 2020-03-01 (×7): 500 mg via ORAL
  Filled 2020-02-24 (×7): qty 1

## 2020-02-24 MED ORDER — ALUM & MAG HYDROXIDE-SIMETH 200-200-20 MG/5ML PO SUSP: 30.0000 mL | ORAL | Status: DC | PRN

## 2020-02-24 MED ORDER — MAGNESIUM HYDROXIDE 400 MG/5ML PO SUSP: 30.0000 mL | Freq: Every day | ORAL | Status: DC | PRN

## 2020-02-24 MED ORDER — PALIPERIDONE ER 3 MG PO TB24
9.0000 mg | ORAL_TABLET | Freq: Every day | ORAL | Status: DC
  Administered 2020-02-25 – 2020-03-02 (×7): 9 mg via ORAL
  Filled 2020-02-24 (×7): qty 3

## 2020-02-24 MED ORDER — ZIPRASIDONE MESYLATE 20 MG IM SOLR
20.0000 mg | Freq: Once | INTRAMUSCULAR | Status: AC
  Administered 2020-02-24: 20 mg via INTRAMUSCULAR

## 2020-02-24 NOTE — Progress Notes (Signed)
Patient is confused on arrival to the unit. His walking is unsteady and he has an abnormal stepping pattern. Patient's thoughts are disorganized and speech is incoherent/word salad. Patient was unable to complete comprehensive assessment on admission. He can sometimes respond slowly and follow simple instructions. Patient was oriented to the unit. He is observed to be insolating to himself in the dayroom.  Patient refused Invega earlier in the day but took his Depakote in the evening with encouragement.   Skin assessment revealed a small open ulcer on his buttock.    Patient remains safe on the unit at this time. Q15 minute safety checks are maintained.

## 2020-02-24 NOTE — ED Notes (Signed)
Pt has refused for this tech to draw blood.Amy, RN at bedside . Pt stated, "No, I already gave blood. And I don't feel like it at this moment." Pt went back to sleep.

## 2020-02-24 NOTE — ED Notes (Signed)
Hourly rounding completed at this time, patient currently awake in room. No complaints, stable, and in no acute distress. Q15 minute rounds and monitoring via Psychologist, counselling to continue. Provided with orange juice and snacks

## 2020-02-24 NOTE — Consult Note (Signed)
[facility] Face-to-Face Psychiatry Consult   Reason for Consult: Follow-up consult for 40 year old man with schizophrenia seen yesterday in the emergency room Referring Physician: Roxan Hockey Patient Identification: Shane Nash MRN:  [id number] Principal Diagnosis: Schizophrenia (HCC) Diagnosis:  Principal Problem:   Schizophrenia (HCC) Active Problems:   Hyponatremia   Polydipsia   Hypoglycemia   Total Time spent with patient: 30 minutes  Subjective:   KIM LAUVER is a 40 y.o. male patient admitted with patient is still so disorganized that he cannot make any kind of lucid comment.  HPI: Patient seen chart reviewed.  After diuresing the presumably enormous amount of water he had consumed his electrolytes and blood sugar are finally back to stable and normal.  Blood pressure normal.  Patient is awake and interactive.  He is speaking in essentially word salad.  I could understand may be a third of the words he was saying but none of it seemed to really hang together.  He has been compliant with medicine.  He denied to me any wish to harm himself or anyone else.  Very withdrawn blunted affect still.  Past Psychiatric History: Patient has a past history of schizophrenia.  Multiple prior admissions.  Often decompensates pretty badly off of medicine.  Risk to Self:   Risk to Others:   Prior Inpatient Therapy:   Prior Outpatient Therapy:    Past Medical History:  Past Medical History:  Diagnosis Date  . Schizophrenia (HCC)    History reviewed. No pertinent surgical history. Family History: No family history on file. Family Psychiatric  History: See previous Social History:  Social History   Substance and Sexual Activity  Alcohol Use None     Social History   Substance and Sexual Activity  Drug Use No    Social History   Socioeconomic History  . Marital status: Single    Spouse name: Not on file  . Number of children: Not on file  . Years of education: Not on file  .  Highest education level: Not on file  Occupational History  . Not on file  Tobacco Use  . Smoking status: Current Every Day Smoker    Packs/day: 0.25    Years: 15.00    Pack years: 3.75    Types: Cigarettes  . Smokeless tobacco: Never Used  . Tobacco comment: Cannot state the amount he smokes per day  Vaping Use  . Vaping Use: Never used  Substance and Sexual Activity  . Alcohol use: Not on file  . Drug use: No  . Sexual activity: Not Currently  Other Topics Concern  . Not on file  Social History Narrative  . Not on file   Social Determinants of Health   Financial Resource Strain: Not on file  Food Insecurity: Not on file  Transportation Needs: Not on file  Physical Activity: Not on file  Stress: Not on file  Social Connections: Not on file   Additional Social History:    Allergies:  No Known Allergies  Labs:  Results for orders placed or performed during the hospital encounter of 02/23/20 (from the past 48 hour(s))  CBC with Differential     Status: Abnormal   Collection Time: 02/23/20  2:25 PM  Result Value Ref Range   WBC 5.2 4.0 - 10.5 K/uL   RBC 3.82 (L) 4.22 - 5.81 MIL/uL   Hemoglobin 11.7 (L) 13.0 - 17.0 g/dL   HCT 40.9 (L) 81.1 - 91.4 %   MCV 88.0 80.0 - 100.0 fL  MCH 30.6 26.0 - 34.0 pg   MCHC 34.8 30.0 - 36.0 g/dL   RDW 89.3 73.4 - 28.7 %   Platelets 300 150 - 400 K/uL   nRBC 0.0 0.0 - 0.2 %   Neutrophils Relative % 64 %   Neutro Abs 3.3 1.7 - 7.7 K/uL   Lymphocytes Relative 25 %   Lymphs Abs 1.3 0.7 - 4.0 K/uL   Monocytes Relative 10 %   Monocytes Absolute 0.5 0.1 - 1.0 K/uL   Eosinophils Relative 1 %   Eosinophils Absolute 0.1 0.0 - 0.5 K/uL   Basophils Relative 0 %   Basophils Absolute 0.0 0.0 - 0.1 K/uL   Immature Granulocytes 0 %   Abs Immature Granulocytes 0.01 0.00 - 0.07 K/uL    Comment: Performed at [facility], [address] [place], [place] [address]  Comprehensive metabolic panel     Status: Abnormal   Collection  Time: 02/23/20  2:25 PM  Result Value Ref Range   Sodium 125 (L) 135 - 145 mmol/L   Potassium 3.2 (L) 3.5 - 5.1 mmol/L   Chloride 91 (L) 98 - 111 mmol/L   CO2 23 22 - 32 mmol/L   Glucose, Bld 67 (L) 70 - 99 mg/dL    Comment: Glucose reference range applies only to samples taken after fasting for at least 8 hours.   BUN 7 6 - 20 mg/dL   Creatinine, Ser 7.26 0.61 - 1.24 mg/dL   Calcium 8.3 (L) 8.9 - 10.3 mg/dL   Total Protein 7.0 6.5 - 8.1 g/dL   Albumin 3.5 3.5 - 5.0 g/dL   AST 33 15 - 41 U/L   ALT 15 0 - 44 U/L   Alkaline Phosphatase 77 38 - 126 U/L   Total Bilirubin 0.6 0.3 - 1.2 mg/dL   GFR, Estimated >20 >35 mL/min    Comment: (NOTE) Calculated using the CKD-EPI Creatinine Equation (2021)    Anion gap 11 5 - 15    Comment: Performed at [facility], [address] [place], [place] [address]  SARS Coronavirus 2 by RT PCR (hospital order, performed in [facility] hospital lab) Nasopharyngeal Nasopharyngeal Swab     Status: None   Collection Time: 02/23/20  2:26 PM   Specimen: Nasopharyngeal Swab  Result Value Ref Range   SARS Coronavirus 2 NEGATIVE NEGATIVE    Comment: (NOTE) SARS-CoV-2 target nucleic acids are NOT DETECTED.  The SARS-CoV-2 RNA is generally detectable in upper and lower respiratory specimens during the acute phase of infection. The lowest concentration of SARS-CoV-2 viral copies this assay can detect is 250 copies / mL. A negative result does not preclude SARS-CoV-2 infection and should not be used as the sole basis for treatment or other patient management decisions.  A negative result may occur with improper specimen collection / handling, submission of specimen other than nasopharyngeal swab, presence of viral mutation(s) within the areas targeted by this assay, and inadequate number of viral copies (<250 copies / mL). A negative result must be combined with clinical observations, patient history, and epidemiological information.  Fact Sheet  for Patients:   BoilerBrush.com.cy  Fact Sheet for Healthcare Providers: https://pope.com/  This test is not yet approved or  cleared by the [place] FDA and has been authorized for detection and/or diagnosis of SARS-CoV-2 by FDA under an Emergency Use Authorization (EUA).  This EUA will remain in effect (meaning this test can be used) for the duration of the COVID-19 declaration under Section  564(b)(1) of the Act, 21 U.S.C. section 360bbb-3(b)(1), unless the authorization is terminated or revoked sooner.  Performed at [facility], [address] [place], [place] [address]   Valproic acid level     Status: Abnormal   Collection Time: 02/23/20  2:26 PM  Result Value Ref Range   Valproic Acid Lvl <10 (L) 50.0 - 100.0 ug/mL    Comment: RESULT CONFIRMED BY MANUAL DILUTION DB Performed at [facility], [address] [place], [place] [address]   CBG monitoring, ED     Status: Abnormal   Collection Time: 02/23/20  4:39 PM  Result Value Ref Range   Glucose-Capillary 58 (L) 70 - 99 mg/dL    Comment: Glucose reference range applies only to samples taken after fasting for at least 8 hours.   Comment 1 Notify RN    Comment 2 Document in Chart   Urine Drug Screen, Qualitative ([facility] only)     Status: None   Collection Time: 02/23/20  5:28 PM  Result Value Ref Range   Tricyclic, Ur Screen NONE DETECTED NONE DETECTED   Amphetamines, Ur Screen NONE DETECTED NONE DETECTED   MDMA (Ecstasy)Ur Screen NONE DETECTED NONE DETECTED   Cocaine Metabolite,Ur [place] NONE DETECTED NONE DETECTED   Opiate, Ur Screen NONE DETECTED NONE DETECTED   Phencyclidine (PCP) Ur S NONE DETECTED NONE DETECTED   Cannabinoid 50 Ng, Ur [place] NONE DETECTED NONE DETECTED   Barbiturates, Ur Screen NONE DETECTED NONE DETECTED   Benzodiazepine, Ur Scrn NONE DETECTED NONE DETECTED   Methadone Scn, Ur NONE DETECTED NONE DETECTED    Comment:  (NOTE) Tricyclics + metabolites, urine    Cutoff 1000 ng/mL Amphetamines + metabolites, urine  Cutoff 1000 ng/mL MDMA (Ecstasy), urine              Cutoff 500 ng/mL Cocaine Metabolite, urine          Cutoff 300 ng/mL Opiate + metabolites, urine        Cutoff 300 ng/mL Phencyclidine (PCP), urine         Cutoff 25 ng/mL Cannabinoid, urine                 Cutoff 50 ng/mL Barbiturates + metabolites, urine  Cutoff 200 ng/mL Benzodiazepine, urine              Cutoff 200 ng/mL Methadone, urine                   Cutoff 300 ng/mL  The urine drug screen provides only a preliminary, unconfirmed analytical test result and should not be used for non-medical purposes. Clinical consideration and professional judgment should be applied to any positive drug screen result due to possible interfering substances. A more specific alternate chemical method must be used in order to obtain a confirmed analytical result. Gas chromatography / mass spectrometry (GC/MS) is the preferred confirm atory method. Performed at [facility], [address] [place], [place] [address]   Urinalysis, Complete w Microscopic Urine, Clean Catch     Status: Abnormal   Collection Time: 02/23/20  5:29 PM  Result Value Ref Range   Color, Urine STRAW (A) YELLOW   APPearance CLEAR (A) CLEAR   Specific Gravity, Urine 1.001 (L) 1.005 - 1.030   pH 6.0 5.0 - 8.0   Glucose, UA 50 (A) NEGATIVE mg/dL   Hgb urine dipstick NEGATIVE NEGATIVE   Bilirubin Urine NEGATIVE NEGATIVE   Ketones, ur NEGATIVE NEGATIVE mg/dL   Protein, ur NEGATIVE  NEGATIVE mg/dL   Nitrite NEGATIVE NEGATIVE   Leukocytes,Ua NEGATIVE NEGATIVE   RBC / HPF 0-5 0 - 5 RBC/hpf   WBC, UA NONE SEEN 0 - 5 WBC/hpf   Bacteria, UA RARE (A) NONE SEEN   Squamous Epithelial / LPF NONE SEEN 0 - 5    Comment: Performed at [facility], [address] [place], [place] [address]  CBG monitoring, ED     Status: None   Collection Time: 02/23/20  5:56 PM   Result Value Ref Range   Glucose-Capillary 79 70 - 99 mg/dL    Comment: Glucose reference range applies only to samples taken after fasting for at least 8 hours.   Comment 1 Notify RN    Comment 2 Document in Chart   Troponin I (High Sensitivity)     Status: None   Collection Time: 02/23/20  7:24 PM  Result Value Ref Range   Troponin I (High Sensitivity) 5 <18 ng/L    Comment: (NOTE) Elevated high sensitivity troponin I (hsTnI) values and significant  changes across serial measurements may suggest ACS but many other  chronic and acute conditions are known to elevate hsTnI results.  Refer to the "Links" section for chest pain algorithms and additional  guidance. Performed at [facility], [address] [place], [place] [address]   CBG monitoring, ED     Status: None   Collection Time: 02/23/20  7:25 PM  Result Value Ref Range   Glucose-Capillary 97 70 - 99 mg/dL    Comment: Glucose reference range applies only to samples taken after fasting for at least 8 hours.  CBG monitoring, ED     Status: None   Collection Time: 02/23/20 10:37 PM  Result Value Ref Range   Glucose-Capillary 82 70 - 99 mg/dL    Comment: Glucose reference range applies only to samples taken after fasting for at least 8 hours.  Basic metabolic panel     Status: Abnormal   Collection Time: 02/24/20  6:18 AM  Result Value Ref Range   Sodium 142 135 - 145 mmol/L   Potassium 4.4 3.5 - 5.1 mmol/L   Chloride 106 98 - 111 mmol/L   CO2 28 22 - 32 mmol/L   Glucose, Bld 93 70 - 99 mg/dL    Comment: Glucose reference range applies only to samples taken after fasting for at least 8 hours.   BUN 8 6 - 20 mg/dL   Creatinine, Ser 0.980.96 0.61 - 1.24 mg/dL   Calcium 8.8 (L) 8.9 - 10.3 mg/dL   GFR, Estimated >11>60 >91>60 mL/min    Comment: (NOTE) Calculated using the CKD-EPI Creatinine Equation (2021)    Anion gap 8 5 - 15    Comment: Performed at [facility], [address] [place], [place] [address]     Current Facility-Administered Medications  Medication Dose Route Frequency Provider Last Rate Last Admin  . divalproex (DEPAKOTE ER) 24 hr tablet 500 mg  500 mg Oral QHS Rhyan Wolters T, MD      . paliperidone (INVEGA) 24 hr tablet 9 mg  9 mg Oral Daily Chesley NoonJessup, Charles, MD       Current Outpatient Medications  Medication Sig Dispense Refill  . benztropine (COGENTIN) 1 MG tablet Take 1 tablet (1 mg total) by mouth 2 (two) times daily. 60 tablet 0  . divalproex (DEPAKOTE ER) 500 MG 24 hr tablet Take 1 tablet (500 mg total) by mouth at bedtime. 30 tablet 0  . hydrOXYzine (  ATARAX/VISTARIL) 50 MG tablet Take 1 tablet (50 mg total) by mouth every 6 (six) hours as needed for anxiety. 30 tablet 0  . INVEGA SUSTENNA 234 MG/1.5ML SUSY injection Inject 234 mg into the muscle every 30 (thirty) days. 1.8 mL 0  . traZODone (DESYREL) 50 MG tablet Take 1 tablet (50 mg total) by mouth at bedtime as needed for sleep. 30 tablet 0    Musculoskeletal: Strength & Muscle Tone: within normal limits Gait & Station: normal Patient leans: N/A  Psychiatric Specialty Exam: Physical Exam Vitals and nursing note reviewed.  Constitutional:      Appearance: He is well-developed and well-nourished.  HENT:     Head: Normocephalic and atraumatic.  Eyes:     Conjunctiva/sclera: Conjunctivae normal.     Pupils: Pupils are equal, round, and reactive to light.  Cardiovascular:     Heart sounds: Normal heart sounds.  Pulmonary:     Effort: Pulmonary effort is normal.  Abdominal:     Palpations: Abdomen is soft.  Musculoskeletal:        General: Normal range of motion.     Cervical back: Normal range of motion.  Skin:    General: Skin is warm and dry.  Neurological:     General: No focal deficit present.     Mental Status: He is alert.  Psychiatric:        Attention and Perception: He is inattentive.        Mood and Affect: Affect is blunt.        Speech: He is noncommunicative.        Behavior: Behavior  is withdrawn.        Thought Content: Thought content does not include homicidal or suicidal ideation.        Cognition and Memory: Cognition is impaired.        Judgment: Judgment is inappropriate.     Review of Systems  Unable to perform ROS: Psychiatric disorder  Skin: Negative.     Blood pressure 111/65, pulse 60, temperature 98.4 F (36.9 C), resp. rate 18, height 5\' 11"  (1.803 m), weight 77.1 kg, SpO2 99 %.Body mass index is 23.71 kg/m.  General Appearance: Disheveled  Eye Contact:  Minimal  Speech:  Garbled  Volume:  Decreased  Mood:  Dysphoric  Affect:  Constricted  Thought Process:  Disorganized  Orientation:  Other:  Does not know the date but is not delirious.  He knows he is in the hospital and understands the basic situation  Thought Content:  Illogical and Paranoid Ideation  Suicidal Thoughts:  No  Homicidal Thoughts:  No  Memory:  Immediate;   Fair Recent;   Poor Remote;   Poor  Judgement:  Impaired  Insight:  Shallow  Psychomotor Activity:  Decreased  Concentration:  Concentration: Poor  Recall:  Poor  Fund of Knowledge:  Poor  Language:  Poor  Akathisia:  No  Handed:  Right  AIMS (if indicated):     Assets:  Housing Resilience  ADL's:  Impaired  Cognition:  Impaired,  Mild  Sleep:        Treatment Plan Summary: Medication management and Plan Patient continues to be psychotic disorganized mildly agitated but not violent or threatening.  Vital signs stabilized.  Cooperative with medicine.  Meets criteria for inpatient hospitalization.  Case reviewed with emergency room physician and TTS.  Orders will be placed for admission to the psychiatric ward.  Disposition: Recommend psychiatric Inpatient admission when medically cleared.  , MD  02/24/2020 12:17 PM

## 2020-02-24 NOTE — BH Assessment (Signed)
Patient is to be admitted to [facility] BMU at 8:00pm by Dr. Neale Burly.  Attending Physician will be Dr. Toni Amend.  Patient has been assigned to room 324, by [facility] Charge Nurse Maryelizabeth Kaufmann, RN.  ER staff is aware of the admission:  Nitchia, ER Secretary Vicente Males, ER MD Amy, Patient's Nurse Tho, Patient Access.

## 2020-02-24 NOTE — ED Notes (Signed)
Hourly rounding completed at this time, patient currently asleep in room. No complaints, stable, and in no acute distress. Q15 minute rounds and monitoring via Rover and Officer to continue. 

## 2020-02-24 NOTE — Tx Team (Signed)
Initial Treatment Plan 02/24/2020 11:11 PM [name]   PATIENT STRESSORS: Medication change or noncompliance   PATIENT STRENGTHS: Physical Health Supportive family/friends   PATIENT IDENTIFIED PROBLEMS: Stabilize on medications and return to previous living arrangement                     DISCHARGE CRITERIA:  Ability to meet basic life and health needs Improved stabilization in mood, thinking, and/or behavior Need for constant or close observation no longer present  PRELIMINARY DISCHARGE PLAN: Outpatient therapy Return to previous living arrangement  PATIENT/FAMILY INVOLVEMENT: This treatment plan has been presented to and reviewed with the patient, Shane Nash. The patient has been given the opportunity to ask questions and make suggestions.  Celene Kras, RN 02/24/2020, 11:11 PM

## 2020-02-24 NOTE — ED Notes (Signed)
Pt takes Geodon now, pt was apprehensive. Raquel, RN assisted and was able to convince pt. Pt jumped out of bed and ambulated quickly to corner behind door when staff entered room, pt appears to be hallucinating. Pt bent over and started to tap floor like he was typing, was not able to explain what he was doing. Returned to bed to receive IM.

## 2020-02-24 NOTE — BH Assessment (Signed)
Comprehensive Clinical Assessment (CCA) Note  02/24/2020 Shane Nash [id number]   [name], 40 year old male who presents to [facility] ED involuntarily for treatment. Per triage note, BIB sister from home who states pt has schizophrenia and has been off his mediations for months. Pt arrives appearing fearful and guarded. Pt has allegedly not been sleeping over past 3 days and was playing in his feces this morning.   During TTS assessment pt presents alert and oriented x 2, restless but cooperative, and mood-congruent with affect. The pt does not appear to be responding to internal or external stimuli. Neither is the pt presenting with any delusional thinking. It was extremely difficult to understand patient. When asked what brought patient to the ED, patient repeated over and over that he did the wrong thing. Patient is talking fast; his speech is garbled and only a few words are clear. Patient's judgment is impaired and his thoughts are disorganized. Patient denies SI/HI.    Per Dr. Toni Amend pt is recommended for inpatient psychiatric treatment.     Chief Complaint:  Chief Complaint  Patient presents with  . Psychiatric Evaluation   Visit Diagnosis: Schizophrenia    CCA Screening, Triage and Referral (STR)  Patient Reported Information How did you hear about [place]? Family/Friend  Referral name: No data recorded Referral phone number: No data recorded  Whom do you see for routine medical problems? No data recorded Practice/Facility Name: No data recorded Practice/Facility Phone Number: No data recorded Name of Contact: No data recorded Contact Number: No data recorded Contact Fax Number: No data recorded Prescriber Name: No data recorded Prescriber Address (if known): No data recorded  What Is the Reason for Your Visit/Call Today? When asked this question, patient consistently stated he did the wrong thing. It was extremely difficult to understand patient. Patient was talking  very fast and his words were running together. ie..gibberish.  How Long Has This Been Causing You Problems? No data recorded What Do You Feel Would Help You the Most Today? No data recorded  Have You Recently Been in Any Inpatient Treatment (Hospital/Detox/Crisis Center/28-Day Program)? No data recorded Name/Location of Program/Hospital:No data recorded How Long Were You There? No data recorded When Were You Discharged? No data recorded  Have You Ever Received Services From [facility] Before? No data recorded Who Do You See at [facility]? No data recorded  Have You Recently Had Any Thoughts About Hurting Yourself? No  Are You Planning to Commit Suicide/Harm Yourself At This time? No   Have you Recently Had Thoughts About Hurting Someone [name]? No  Explanation: No data recorded  Have You Used Any Alcohol or Drugs in the Past 24 Hours? No data recorded How Long Ago Did You Use Drugs or Alcohol? No data recorded What Did You Use and How Much? No data recorded  Do You Currently Have a Therapist/Psychiatrist? No data recorded Name of Therapist/Psychiatrist: No data recorded  Have You Been Recently Discharged From Any Office Practice or Programs? No data recorded Explanation of Discharge From Practice/Program: No data recorded    CCA Screening Triage Referral Assessment Type of Contact: Face-to-Face  Is this Initial or Reassessment? No data recorded Date Telepsych consult ordered in [facility]:  No data recorded Time Telepsych consult ordered in [facility]:  No data recorded  Patient Reported Information Reviewed? No data recorded Patient Left Without Being Seen? No data recorded Reason for Not Completing Assessment: Patient would not participate with the assessor   Collateral Involvement: No data  recorded  Does Patient Have a Automotive engineer Guardian? No data recorded Name and Contact of Legal Guardian: Self  If Minor and Not Living with Parent(s), Who has Custody? No data  recorded Is CPS involved or ever been involved? No data recorded Is APS involved or ever been involved? No data recorded  Patient Determined To Be At Risk for Harm To Self or Others Based on Review of Patient Reported Information or Presenting Complaint? No data recorded Method: No data recorded Availability of Means: No data recorded Intent: No data recorded Notification Required: No data recorded Additional Information for Danger to Others Potential: No data recorded Additional Comments for Danger to Others Potential: No data recorded Are There Guns or Other Weapons in Your Home? No data recorded Types of Guns/Weapons: No data recorded Are These Weapons Safely Secured?                            No data recorded Who Could Verify You Are Able To Have These Secured: No data recorded Do You Have any Outstanding Charges, Pending Court Dates, Parole/Probation? No data recorded Contacted To Inform of Risk of Harm To Self or Others: No data recorded  Location of Assessment: [facility] ED   Does Patient Present under Involuntary Commitment? Yes  IVC Papers Initial File Date: 02/23/2020   [place] of Residence: [place]   Patient Currently Receiving the Following Services: No data recorded  Determination of Need: Urgent (48 hours)   Options For Referral: Inpatient Hospitalization; Medication Management; Outpatient Therapy     CCA Biopsychosocial Intake/Chief Complaint:  Patient has  Current Symptoms/Problems: No data recorded  Patient Reported Schizophrenia/Schizoaffective Diagnosis in Past: Yes   Strengths: No data recorded Preferences: No data recorded Abilities: No data recorded  Type of Services Patient Feels are Needed: No data recorded  Initial Clinical Notes/Concerns: No data recorded  Mental Health Symptoms Depression:  No data recorded  Duration of Depressive symptoms: No data recorded  Mania:  No data recorded  Anxiety:   No data recorded  Psychosis:  Grossly  disorganized speech   Duration of Psychotic symptoms: No data recorded  Trauma:  No data recorded  Obsessions:  No data recorded  Compulsions:  No data recorded  Inattention:  No data recorded  Hyperactivity/Impulsivity:  No data recorded  Oppositional/Defiant Behaviors:  No data recorded  Emotional Irregularity:  No data recorded  Other Mood/Personality Symptoms:  No data recorded   Mental Status Exam Appearance and self-care  Stature:  Average   Weight:  Average weight   Clothing:  Disheveled   Grooming:  Neglected   Cosmetic use:  None   Posture/gait:  No data recorded  Motor activity:  Agitated; Restless   Sensorium  Attention:  No data recorded  Concentration:  Scattered   Orientation:  Person; Place   Recall/memory:  -- (Poor)   Affect and Mood  Affect:  Constricted   Mood:  Dysphoric   Relating  Eye contact:  Avoided   Facial expression:  Constricted   Attitude toward examiner:  Cooperative   Thought and Language  Speech flow: Garbled   Thought content:  No data recorded  Preoccupation:  No data recorded  Hallucinations:  No data recorded  Organization:  No data recorded  [employer] of Knowledge:  Poor   Intelligence:  No data recorded  Abstraction:  No data recorded  Judgement:  Impaired   Reality Testing:  No data  recorded  Insight:  Shallow   Decision Making:  No data recorded  Social Functioning  Social Maturity:  No data recorded  Social Judgement:  No data recorded  Stress  Stressors:  No data recorded  Coping Ability:  Deficient supports   Skill Deficits:  Activities of daily living; Communication   Supports:  No data recorded    Religion:    Leisure/Recreation:    Exercise/Diet:     CCA Employment/Education Employment/Work Situation:    Education:     CCA Family/Childhood History Family and Relationship History:    Childhood History:     Child/Adolescent Assessment:     CCA  Substance Use Alcohol/Drug Use: Alcohol / Drug Use Pain Medications: See MAR Prescriptions: See MAR Over the Counter: See MAR History of alcohol / drug use?: No history of alcohol / drug abuse                         ASAM's:  Six Dimensions of Multidimensional Assessment  Dimension 1:  Acute Intoxication and/or Withdrawal Potential:      Dimension 2:  Biomedical Conditions and Complications:      Dimension 3:  Emotional, Behavioral, or Cognitive Conditions and Complications:     Dimension 4:  Readiness to Change:     Dimension 5:  Relapse, Continued use, or Continued Problem Potential:     Dimension 6:  Recovery/Living Environment:     ASAM Severity Score:    ASAM Recommended Level of Treatment:     Substance use Disorder (SUD)    Recommendations for Services/Supports/Treatments: Recommendations for Services/Supports/Treatments Recommendations For Services/Supports/Treatments: Inpatient Hospitalization,Individual Therapy,Medication Management  DSM5 Diagnoses: Patient Active Problem List   Diagnosis Date Noted  . Hypoglycemia 02/23/2020  . Tardive dyskinesia 05/18/2019  . Disorganized behavior 05/17/2019  . Tobacco use disorder 03/26/2018  . 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 Schizophrenia (HCC) 01/27/2017  . Hyponatremia 01/27/2017  . Polydipsia 01/27/2017    Patient Centered Plan: Patient is on the following Treatment Plan(s):  Anxiety, Borderline Personality, Depression, Impulse Control, Low Self-Esteem and Post Traumatic Stress Disorder   Referrals to Alternative Service(s): Referred to Alternative Service(s):   Place:   Date:   Time:    Referred to Alternative Service(s):   Place:   Date:   Time:    Referred to Alternative Service(s):   Place:   Date:   Time:    Referred to Alternative Service(s):   Place:   Date:   Time:     Patrice Matthew Dierdre Searles, Counselor, LCAS-A

## 2020-02-24 NOTE — ED Provider Notes (Signed)
Emergency Medicine Observation Re-evaluation Note  Shane Nash is a 40 y.o. male, seen on rounds today.  Pt initially presented to the ED for complaints of Psychiatric Evaluation Currently, the patient is resting comfortably.  Physical Exam  BP 117/61   Pulse 68   Resp 16   Ht 5\' 11"  (1.803 m)   Wt 77.1 kg   SpO2 97%   BMI 23.71 kg/m  Physical Exam Gen: No acute distress  Resp: Normal rise and fall of chest Neuro: Moving all four extremities Psych: Resting currently, calm and cooperative when awake    ED Course / MDM  EKG:    EKG Interpretation  Date/Time:  Thursday February 24 2020 06:18:02 EST Ventricular Rate:  66 PR Interval:    QRS Duration: 106 QT Interval:  396 QTC Calculation: 415 R Axis:   54 Text Interpretation: Sinus arrhythmia No significant change since last tracing Confirmed by Herberta Pickron, 10-24-2003 [phone]) on 02/24/2020 6:21:40 AM         I have reviewed the labs performed to date as well as medications administered while in observation.  Recent changes in the last 24 hours include -increasing agitation.  Patient unable to be redirected.  Administered IM Geodon..  Plan  Current plan is for inpatient psychiatric treatment.  Will need to have BMP rechecked in the morning.  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 Patient is under full IVC at this time.   Luan Urbani, 02/26/2020, DO 02/24/20 [phone]

## 2020-02-24 NOTE — ED Notes (Signed)
Pt woken up, refuses to have blood drawn, pt repeatedly stated, "I've decided" after stating he won't give his blood. Pt mumbles and then is able to be heard stating no, and that he his leaving the hospital. Pt is unable to be convinced. MD notified.

## 2020-02-24 NOTE — ED Notes (Signed)
Pt was able to be cooperative enough to collect lab and EKG. PT continues to be demanding. Pt had ambulated to restroom and urinated before collection of blood and EKG

## 2020-02-24 NOTE — ED Notes (Signed)
Attempting to obtain repeat EKG, pt refuses, will not allow nurse to reconnect pt to monitor or put leads on pt. Pt is continually stating no and has already ripped off all leads, cuffs, and shirt.

## 2020-02-24 NOTE — ED Notes (Signed)
TTS at bedside. 

## 2020-02-24 NOTE — ED Notes (Signed)
RN entered patient's room to get VS.  Pt stated "I don't need those done right now."  RN asked again if she could get his BP.  Pt stated, "No."  Pt remained in bed, covered with blanket.

## 2020-02-24 NOTE — ED Notes (Signed)
Hourly rounding completed at this time, patient currently restless in bed. No complaints, stable, and in no acute distress. Q15 minute rounds and monitoring via Psychologist, counselling to continue.

## 2020-02-24 NOTE — ED Notes (Addendum)
Leads removed from patient and garage door closed. Pt stated he was feeling great.

## 2020-02-24 NOTE — ED Notes (Signed)
Pt had thrown cups of orange juice in room, pulled out IV, thrown all things into the floor. Pt continues to refuse all interventions and will not unwrap from blanket he has over himself. Floor cleaned now

## 2020-02-24 NOTE — ED Notes (Signed)
When pt is awake he is hard to understand, speech is mumbling and rapid.

## 2020-02-24 NOTE — ED Notes (Addendum)
Attempted to draw pt blood again, pt would not hold still and kept saying, "no" "I don't need my blood drawn." will attempt to collect again shortly and obtain EKG

## 2020-02-24 NOTE — ED Notes (Signed)
Pt provided with snack and drink 

## 2020-02-25 DIAGNOSIS — F201 Disorganized schizophrenia: Principal | ICD-10-CM

## 2020-02-25 LAB — HEMOGLOBIN A1C
Hgb A1c MFr Bld: 5.2 % (ref 4.8–5.6)
Mean Plasma Glucose: 102.54 mg/dL

## 2020-02-25 NOTE — H&P (Signed)
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 Overnight, patient continued to have confusion, but no acute events. He was compliant with night time medications. He is having difficulty performing ADLs at this time.   Patient seen one-on-one this morning. He is noted to have what appears to be toothpaste smeared around his mouth, and along his hands. However, he did manage to eat his breakfast without assistance. He continues to appear confused, and mostly only answers questions in one-word. He says "yeah" when asked if he slept last night. When asked where he was living he states "group home" when I asked if he likes it he says "yeah" when asked if he has a roommate he says "yeah" an when asked if they get along he says "yeah." However, upon chart review, it does not appear patient lives in a group home, but rather with his family.   Contacted his sister, Donato Broderson, [phone]. She states he has not lived in a group home since 2012 because those were not working well for him. He is currently living with nephew, and has also lived with his sister Ahsly off and on for last 5 years. She notes that he was in process of getting set up with [facility] ACT team, but has been off his medications since September. Most recently he has decompensated to point he is playing with feces in the toilet, and drinking water excessively.  Associated Signs/Symptoms: Depression Symptoms:   insomnia, Duration of Depression Symptoms: No data recorded (Hypo) Manic Symptoms:  Elevated Mood, Anxiety Symptoms:  Excessive Worry, Psychotic Symptoms:  Delusions, Hallucinations: Auditory Paranoia, Duration of Psychotic Symptoms: No data recorded PTSD Symptoms: Negative Total Time spent with patient: 45 minutes  Past Psychiatric History: Patient has a past history of schizophrenia.  Multiple prior admissions.  Often decompensates badly off of medicine.  Is the patient at risk to self? Yes.    Has the patient been a risk to self in the past 6 months? Yes.    Has the patient been a risk to self within the distant past? Yes.    Is the patient a risk to others? No.  Has the patient been a risk to others in the past 6 months? No.  Has the patient been a risk to others within the distant past? No.   Prior Inpatient Therapy:   Prior Outpatient Therapy:    Alcohol Screening: Patient refused Alcohol Screening Tool: Yes 1. How often do you have a drink containing alcohol?: Never 2. How many drinks containing alcohol do you have on a typical day when you are drinking?: 1 or 2 3. How often do you have six or more drinks on one occasion?: Never AUDIT-C Score: 0 4. How often during the last year have you found that you were not able to stop drinking once you had started?: Never 5. How often during the last year have you failed to do what was normally expected from you because of drinking?: Never 6. How often during the last year have you needed a first drink in the morning to  get yourself going after a heavy drinking session?: Never 7. How often during the last year have you had a feeling of guilt of remorse after drinking?: Never 8. How often during the last year have you been unable to remember what happened the night before because you had been drinking?: Never 9. Have you or someone else been injured as a result of your drinking?: No 10. Has a relative or friend or a doctor or another  health worker been concerned about your drinking or suggested you cut down?: No Alcohol Use Disorder Identification Test Final Score (AUDIT): 0 Alcohol Brief Interventions/Follow-up: AUDIT Score <7 follow-up not indicated Substance Abuse History in the last 12 months:  No. Consequences of Substance Abuse: Negative Previous Psychotropic Medications: Yes  Psychological Evaluations: No  Past Medical History:  Past Medical History:  Diagnosis Date  . Schizophrenia (HCC)    History reviewed. No pertinent surgical history. Family History: History reviewed. No pertinent family history. Family Psychiatric  History: None reported Tobacco Screening:   Social History:  Social History   Substance and Sexual Activity  Alcohol Use None     Social History   Substance and Sexual Activity  Drug Use No    Additional Social History:                           Allergies:  No Known Allergies Lab Results:  Results for orders placed or performed during the hospital encounter of 02/23/20 (from the past 48 hour(s))  Urine Drug Screen, Qualitative ([facility] only)     Status: None   Collection Time: 02/23/20  5:28 PM  Result Value Ref Range   Tricyclic, Ur Screen NONE DETECTED NONE DETECTED   Amphetamines, Ur Screen NONE DETECTED NONE DETECTED   MDMA (Ecstasy)Ur Screen NONE DETECTED NONE DETECTED   Cocaine Metabolite,Ur [place] NONE DETECTED NONE DETECTED   Opiate, Ur Screen NONE DETECTED NONE DETECTED   Phencyclidine (PCP) Ur S NONE DETECTED NONE DETECTED   Cannabinoid 50 Ng, Ur [place] NONE DETECTED NONE DETECTED   Barbiturates, Ur Screen NONE DETECTED NONE DETECTED   Benzodiazepine, Ur Scrn NONE DETECTED NONE DETECTED   Methadone Scn, Ur NONE DETECTED NONE DETECTED    Comment: (NOTE) Tricyclics + metabolites, urine    Cutoff 1000 ng/mL Amphetamines + metabolites, urine  Cutoff 1000 ng/mL MDMA (Ecstasy), urine              Cutoff 500 ng/mL Cocaine Metabolite, urine          Cutoff 300  ng/mL Opiate + metabolites, urine        Cutoff 300 ng/mL Phencyclidine (PCP), urine         Cutoff 25 ng/mL Cannabinoid, urine                 Cutoff 50 ng/mL Barbiturates + metabolites, urine  Cutoff 200 ng/mL Benzodiazepine, urine              Cutoff 200 ng/mL Methadone, urine                   Cutoff 300 ng/mL  The urine drug screen provides only a preliminary, unconfirmed analytical test result and should not be used for non-medical purposes. Clinical consideration and professional judgment should be applied to any positive drug screen result due to possible interfering substances. A more specific alternate chemical method must be used in order to obtain a confirmed analytical result. Gas chromatography / mass spectrometry (  GC/MS) is the preferred confirm atory method. Performed at [facility], [address] [place], [place] [address]   Urinalysis, Complete w Microscopic Urine, Clean Catch     Status: Abnormal   Collection Time: 02/23/20  5:29 PM  Result Value Ref Range   Color, Urine STRAW (A) YELLOW   APPearance CLEAR (A) CLEAR   Specific Gravity, Urine 1.001 (L) 1.005 - 1.030   pH 6.0 5.0 - 8.0   Glucose, UA 50 (A) NEGATIVE mg/dL   Hgb urine dipstick NEGATIVE NEGATIVE   Bilirubin Urine NEGATIVE NEGATIVE   Ketones, ur NEGATIVE NEGATIVE mg/dL   Protein, ur NEGATIVE NEGATIVE mg/dL   Nitrite NEGATIVE NEGATIVE   Leukocytes,Ua NEGATIVE NEGATIVE   RBC / HPF 0-5 0 - 5 RBC/hpf   WBC, UA NONE SEEN 0 - 5 WBC/hpf   Bacteria, UA RARE (A) NONE SEEN   Squamous Epithelial / LPF NONE SEEN 0 - 5    Comment: Performed at [facility], [address] [place], [place] [address]  CBG monitoring, ED     Status: None   Collection Time: 02/23/20  5:56 PM  Result Value Ref Range   Glucose-Capillary 79 70 - 99 mg/dL    Comment: Glucose reference range applies only to samples taken after fasting for at least 8 hours.   Comment 1 Notify RN    Comment 2 Document in Chart    Troponin I (High Sensitivity)     Status: None   Collection Time: 02/23/20  7:24 PM  Result Value Ref Range   Troponin I (High Sensitivity) 5 <18 ng/L    Comment: (NOTE) Elevated high sensitivity troponin I (hsTnI) values and significant  changes across serial measurements may suggest ACS but many other  chronic and acute conditions are known to elevate hsTnI results.  Refer to the "Links" section for chest pain algorithms and additional  guidance. Performed at [facility], [address] [place], [place] [address]   CBG monitoring, ED     Status: None   Collection Time: 02/23/20  7:25 PM  Result Value Ref Range   Glucose-Capillary 97 70 - 99 mg/dL    Comment: Glucose reference range applies only to samples taken after fasting for at least 8 hours.  CBG monitoring, ED     Status: None   Collection Time: 02/23/20 10:37 PM  Result Value Ref Range   Glucose-Capillary 82 70 - 99 mg/dL    Comment: Glucose reference range applies only to samples taken after fasting for at least 8 hours.  Basic metabolic panel     Status: Abnormal   Collection Time: 02/24/20  6:18 AM  Result Value Ref Range   Sodium 142 135 - 145 mmol/L   Potassium 4.4 3.5 - 5.1 mmol/L   Chloride 106 98 - 111 mmol/L   CO2 28 22 - 32 mmol/L   Glucose, Bld 93 70 - 99 mg/dL    Comment: Glucose reference range applies only to samples taken after fasting for at least 8 hours.   BUN 8 6 - 20 mg/dL   Creatinine, Ser 3.35 0.61 - 1.24 mg/dL   Calcium 8.8 (L) 8.9 - 10.3 mg/dL   GFR, Estimated >45 >62 mL/min    Comment: (NOTE) Calculated using the CKD-EPI Creatinine Equation (2021)    Anion gap 8 5 - 15    Comment: Performed at [facility], [address] [place], [place] [address]    Blood Alcohol level:  Lab Results  Component Value Date   ETH <10 05/15/2019   ETH <10 05/07/2019    Metabolic Disorder Labs:  Lab Results  Component Value Date   HGBA1C 5.2 02/23/2020   MPG 102.54  02/23/2020   MPG 102.54 03/27/2018   No results found for: PROLACTIN Lab Results  Component Value Date   CHOL 193 03/27/2018   TRIG 154 (H) 03/27/2018   HDL 51 03/27/2018   CHOLHDL 3.8 03/27/2018   VLDL 31 03/27/2018   LDLCALC 111 (H) 03/27/2018   LDLCALC 102 (H) 01/31/2017    Current Medications: Current Facility-Administered Medications  Medication Dose Route Frequency Provider Last Rate Last Admin  . acetaminophen (TYLENOL) tablet 650 mg  650 mg Oral Q6H PRN [name], [name], MD      . alum & mag hydroxide-simeth (MAALOX/MYLANTA) 200-200-20 MG/5ML suspension 30 mL  30 mL Oral Q4H PRN [name], [name], MD      . divalproex (DEPAKOTE ER) 24 hr tablet 500 mg  500 mg Oral QHS [name], [name], MD   500 mg at 02/24/20 2116  . magnesium hydroxide (MILK OF MAGNESIA) suspension 30 mL  30 mL Oral Daily PRN [name], [name], MD      . paliperidone (INVEGA) 24 hr tablet 9 mg  9 mg Oral Daily [name], [name], MD   9 mg at 02/25/20 9323   PTA Medications: Medications Prior to Admission  Medication Sig Dispense Refill Last Dose  . benztropine (COGENTIN) 1 MG tablet Take 1 tablet (1 mg total) by mouth 2 (two) times daily. 60 tablet 0   . divalproex (DEPAKOTE ER) 500 MG 24 hr tablet Take 1 tablet (500 mg total) by mouth at bedtime. 30 tablet 0   . hydrOXYzine (ATARAX/VISTARIL) 50 MG tablet Take 1 tablet (50 mg total) by mouth every 6 (six) hours as needed for anxiety. 30 tablet 0   . INVEGA SUSTENNA 234 MG/1.5ML SUSY injection Inject 234 mg into the muscle every 30 (thirty) days. 1.8 mL 0   . traZODone (DESYREL) 50 MG tablet Take 1 tablet (50 mg total) by mouth at bedtime as needed for sleep. 30 tablet 0     Musculoskeletal: Strength & Muscle Tone: within normal limits Gait & Station: normal Patient leans: N/A  Psychiatric Specialty Exam: Physical Exam Vitals and nursing note reviewed.  Constitutional:      Appearance: Normal appearance.  HENT:     Head: Atraumatic.     Right Ear:  External ear normal.     Left Ear: External ear normal.     Nose: Nose normal.     Mouth/Throat:     Mouth: Mucous membranes are moist.     Pharynx: Oropharynx is clear.  Eyes:     Extraocular Movements: Extraocular movements intact.     Conjunctiva/sclera: Conjunctivae normal.     Pupils: Pupils are equal, round, and reactive to light.  Cardiovascular:     Rate and Rhythm: Normal rate.     Pulses: Normal pulses.  Pulmonary:     Effort: Pulmonary effort is normal.     Breath sounds: Normal breath sounds.  Abdominal:     General: Abdomen is flat.     Palpations: Abdomen is soft.  Musculoskeletal:        General: No swelling. Normal range of motion.     Cervical back: Normal range of motion and neck supple.  Skin:    General: Skin is warm and dry.  Neurological:     Mental Status: He  is alert. He is disoriented.     Cranial Nerves: No cranial nerve deficit.  Psychiatric:        Attention and Perception: He is inattentive. He perceives auditory and visual hallucinations.        Mood and Affect: Mood is depressed. Affect is flat.        Speech: Speech is delayed.        Behavior: Behavior is slowed.        Thought Content: Thought content is paranoid and delusional.        Cognition and Memory: Cognition is impaired. Memory is impaired.        Judgment: Judgment is inappropriate.     Review of Systems  Unable to perform ROS: Mental status change    Blood pressure 103/72, pulse 74, temperature 98.1 F (36.7 C), temperature source Oral, resp. rate 14, height 5' 8.5" (1.74 m), weight 85 kg, SpO2 100 %.Body mass index is 28.09 kg/m.  General Appearance: Disheveled  Eye Contact:  Minimal  Speech:  Garbled  Volume:  Decreased  Mood:  Dysphoric  Affect:  Constricted  Thought Process:  Disorganized  Orientation:  Other:  knows name, location  Thought Content:  Illogical and Paranoid Ideation  Suicidal Thoughts:  No  Homicidal Thoughts:  No  Memory:  Immediate;    Fair Recent;   Poor Remote;   Poor  Judgement:  Impaired  Insight:  Shallow  Psychomotor Activity:  Decreased  Concentration:  Concentration: Poor and Attention Span: Poor  Recall:  Poor  Fund of Knowledge:  Poor  Language:  Poor  Akathisia:  Negative  Handed:  Right  AIMS (if indicated):     Assets:  Desire for Improvement Housing Resilience  ADL's:  Impaired  Cognition:  Impaired,  Mild  Sleep:  Number of Hours: 5.5       Treatment Plan Summary: Daily contact with patient to assess and evaluate symptoms and progress in treatment and Medication management PLAN OF CARE: Patient is a 40 year old male with history of schizophrenia presenting with psychosis, disorganization, and inability to care for self. Resume Invega 9 mg daily with plan to transition to LAI if tolerated, Depakote 500 QHS.   Observation Level/Precautions:  15 minute checks  Laboratory:  lipid panel, hemoglobin a1c  Psychotherapy:    Medications:    Consultations:    Discharge Concerns:    Estimated LOS:  Other:     Physician Treatment Plan for Primary Diagnosis: Disorganized schizophrenia (HCC) Long Term Goal(s): Improvement in symptoms so as ready for discharge  Short Term Goals: Ability to identify changes in lifestyle to reduce recurrence of condition will improve, Ability to verbalize feelings will improve, Ability to disclose and discuss suicidal ideas, Ability to demonstrate self-control will improve, Ability to identify and develop effective coping behaviors will improve, Ability to maintain clinical measurements within normal limits will improve and Compliance with prescribed medications will improve  Physician Treatment Plan for Secondary Diagnosis: Principal Problem:   Disorganized schizophrenia (HCC) Active Problems:   Polydipsia   Tobacco use disorder   Disorganized behavior  Long Term Goal(s): Improvement in symptoms so as ready for discharge  Short Term Goals: Ability to identify changes  in lifestyle to reduce recurrence of condition will improve, Ability to verbalize feelings will improve, Ability to disclose and discuss suicidal ideas, Ability to demonstrate self-control will improve, Ability to identify and develop effective coping behaviors will improve, Ability to maintain clinical measurements within normal limits will improve and Compliance  with prescribed medications will improve  I certify that inpatient services furnished can reasonably be expected to improve the patient's condition.    [name], MD 1/28/20225:05 PM

## 2020-02-25 NOTE — Progress Notes (Signed)
D: Pt alert and oriented. Pt denies experiencing any anxiety/depression at this time. Pt denies experiencing any pain at this time. Pt denies experiencing any SI/HI, or AVH at this time.   Pt was observed spending some time in the dayroom watching television. Pt's thoughts and speech appear to still be disorganized at this time.  A: Scheduled medications administered to pt, per MD orders. Support and encouragement provided. Frequent verbal contact made. Routine safety checks conducted q15 minutes.   R: No adverse drug reactions noted. Pt verbally contracts for safety at this time. Pt complaint with medications. Pt interacts well with others on the unit. Pt remains safe at this time. Will continue to monitor.

## 2020-02-25 NOTE — [facility] Counselor (Signed)
CSW met with patient to attempt PSA. When asked what brought him into the hospital, patient stated, "I keep doing the same thing, same things, the same things, same things" (repeating this roughly 15 times). He stated that he did not know his goal for treatment. CSW was not able to ascertain where patient was living prior to entering the hospital as patient just kept saying different street names and would not identify whether he was at a family home or some kind of group home. Due to comments, CSW inquired about guardianship status, to which patient replied, "No, I'm 40 years old." When asked what he wanted to do when he left the hospital, patient sat up in bed began to wiggle his fingers and look at his hands before patting his chest with his right hand and looking at the CSW. CSW stated that he just wanted to check in with patient and that if he needed anything the staff could help him or get in touch with the CSW.   Chalmers Guest. Guerry Bruin, MSW, [place], [place] 02/25/2020 11:57 AM

## 2020-02-25 NOTE — Progress Notes (Signed)
Recreation Therapy Notes  Date: 02/25/2020  Time: 9:30 am   Location: Craft room     Behavioral response: N/A   Intervention Topic: Time Management   Discussion/Intervention: Patient did not attend group.   Clinical Observations/Feedback:  Patient did not attend group.   Stasha Naraine LRT/CTRS         Adrie Picking 02/25/2020 10:42 AM

## 2020-02-25 NOTE — BHH Suicide Risk Assessment (Signed)
[facility] Admission Suicide Risk Assessment   Nursing information obtained from:  Patient Demographic factors:  Male Current Mental Status:  NA Loss Factors:  NA Historical Factors:  NA Risk Reduction Factors:  Positive social support,Living with another person, especially a relative  Total Time spent with patient: 45 minutes Principal Problem: <principal problem not specified> Diagnosis:  Active Problems:   Disorganized schizophrenia (HCC)  Subjective Data: Patient is a 40 year old male with history of schizophrenia brought to the hospital by his sister for confusion, poor self-care, and disorganized behavior. Overnight, patient continued to have confusion, but no acute events. He was compliant with night time medications. He is having difficulty performing ADLs at this time.   Patient seen one-on-one this morning. He is noted to have what appears to be toothpaste smeared around his mouth, and along his hands. However, he did manage to eat his breakfast without assistance. He continues to appear confused, and mostly only answers questions in one-word. He says "yeah" when asked if he slept last night. When asked where he was living he states "group home" when I asked if he likes it he says "yeah" when asked if he has a roommate he says "yeah" an when asked if they get along he says "yeah." However, upon chart review, it does not appear patient lives in a group home, but rather with his family.   Contacted his sister, Yacqub Baston, [phone]. She states he has not lived in a group home since 2012 because those were not working well for him. He is currently living with nephew, and has also lived with his sister Ahsly off and on for last 5 years. She notes that he was in process of getting set up with [facility] ACT team, but has been off his medications since September. Most recently he has decompensated to point he is playing with feces in the toilet, and drinking water excessively.   Continued  Clinical Symptoms:  Alcohol Use Disorder Identification Test Final Score (AUDIT): 0 The "Alcohol Use Disorders Identification Test", Guidelines for Use in Primary Care, Second Edition.  World Science writer [facility]). Score between 0-7:  no or low risk or alcohol related problems. Score between 8-15:  moderate risk of alcohol related problems. Score between 16-19:  high risk of alcohol related problems. Score 20 or above:  warrants further diagnostic evaluation for alcohol dependence and treatment.   CLINICAL FACTORS:   Schizophrenia:   Paranoid or undifferentiated type Currently Psychotic Unstable or Poor Therapeutic Relationship Previous Psychiatric Diagnoses and Treatments   Musculoskeletal: Strength & Muscle Tone: within normal limits Gait & Station: normal Patient leans: N/A  Psychiatric Specialty Exam: Physical Exam Vitals and nursing note reviewed.  Constitutional:      Appearance: Normal appearance.  HENT:     Head: Atraumatic.     Right Ear: External ear normal.     Left Ear: External ear normal.     Nose: Nose normal.     Mouth/Throat:     Mouth: Mucous membranes are moist.     Pharynx: Oropharynx is clear.  Eyes:     Extraocular Movements: Extraocular movements intact.     Conjunctiva/sclera: Conjunctivae normal.     Pupils: Pupils are equal, round, and reactive to light.  Cardiovascular:     Rate and Rhythm: Normal rate.     Pulses: Normal pulses.  Pulmonary:     Effort: Pulmonary effort is normal.     Breath sounds: Normal breath sounds.  Abdominal:  General: Abdomen is flat.     Palpations: Abdomen is soft.  Musculoskeletal:        General: No swelling. Normal range of motion.     Cervical back: Normal range of motion and neck supple.  Skin:    General: Skin is warm and dry.  Neurological:     Mental Status: He is alert. He is disoriented.     Cranial Nerves: No cranial nerve deficit.  Psychiatric:        Attention and Perception: He is  inattentive. He perceives auditory and visual hallucinations.        Mood and Affect: Mood is depressed. Affect is flat.        Speech: Speech is delayed.        Behavior: Behavior is slowed.        Thought Content: Thought content is paranoid and delusional.        Cognition and Memory: Cognition is impaired. Memory is impaired.        Judgment: Judgment is inappropriate.     Review of Systems  Unable to perform ROS: Mental status change    Blood pressure 103/72, pulse 74, temperature 98.1 F (36.7 C), temperature source Oral, resp. rate 14, height 5' 8.5" (1.74 m), weight 85 kg, SpO2 100 %.Body mass index is 28.09 kg/m.  General Appearance: Disheveled  Eye Contact:  Minimal  Speech:  Garbled  Volume:  Decreased  Mood:  Dysphoric  Affect:  Constricted  Thought Process:  Disorganized  Orientation:  Other:  knows name, location  Thought Content:  Illogical and Paranoid Ideation  Suicidal Thoughts:  No  Homicidal Thoughts:  No  Memory:  Immediate;   Fair Recent;   Poor Remote;   Poor  Judgement:  Impaired  Insight:  Shallow  Psychomotor Activity:  Decreased  Concentration:  Concentration: Poor and Attention Span: Poor  Recall:  Poor  Fund of Knowledge:  Poor  Language:  Poor  Akathisia:  Negative  Handed:  Right  AIMS (if indicated):     Assets:  Desire for Improvement Housing Resilience  ADL's:  Impaired  Cognition:  Impaired,  Mild  Sleep:  Number of Hours: 5.5      COGNITIVE FEATURES THAT CONTRIBUTE TO RISK:  Loss of executive function    SUICIDE RISK:   Mild:  Suicidal ideation of limited frequency, intensity, duration, and specificity.  There are no identifiable plans, no associated intent, mild dysphoria and related symptoms, good self-control (both objective and subjective assessment), few other risk factors, and identifiable protective factors, including available and accessible social support.  PLAN OF CARE: Patient is a 40 year old male with history of  schizophrenia presenting with psychosis, disorganization, and inability to care for self. Resume Invega 9 mg daily with plan to transition to LAI if tolerated, Depakote 500 QHS.   I certify that inpatient services furnished can reasonably be expected to improve the patient's condition.   [name], MD 02/25/2020, 4:44 PM

## 2020-02-25 NOTE — Tx Team (Addendum)
Interdisciplinary Treatment and Diagnostic Plan Update  02/25/2020 Time of Session: 9:00AM Shane Nash MRN: [id number]  Principal Diagnosis: <principal problem not specified>  Secondary Diagnoses: Active Problems:   Disorganized schizophrenia ([place])   Current Medications:  Current Facility-Administered Medications  Medication Dose Route Frequency Provider Last Rate Last Admin  . acetaminophen (TYLENOL) tablet 650 mg  650 mg Oral Q6H PRN [name], [name], MD      . alum & mag hydroxide-simeth (MAALOX/MYLANTA) 200-200-20 MG/5ML suspension 30 mL  30 mL Oral Q4H PRN [name], [name], MD      . divalproex (DEPAKOTE ER) 24 hr tablet 500 mg  500 mg Oral QHS [name], [name], MD   500 mg at 02/24/20 2116  . magnesium hydroxide (MILK OF MAGNESIA) suspension 30 mL  30 mL Oral Daily PRN [name], [name], MD      . paliperidone (INVEGA) 24 hr tablet 9 mg  9 mg Oral Daily [name], Madie Reno, MD   9 mg at 02/25/20 6759   PTA Medications: Medications Prior to Admission  Medication Sig Dispense Refill Last Dose  . benztropine (COGENTIN) 1 MG tablet Take 1 tablet (1 mg total) by mouth 2 (two) times daily. 60 tablet 0   . divalproex (DEPAKOTE ER) 500 MG 24 hr tablet Take 1 tablet (500 mg total) by mouth at bedtime. 30 tablet 0   . hydrOXYzine (ATARAX/VISTARIL) 50 MG tablet Take 1 tablet (50 mg total) by mouth every 6 (six) hours as needed for anxiety. 30 tablet 0   . INVEGA SUSTENNA 234 MG/1.5ML SUSY injection Inject 234 mg into the muscle every 30 (thirty) days. 1.8 mL 0   . traZODone (DESYREL) 50 MG tablet Take 1 tablet (50 mg total) by mouth at bedtime as needed for sleep. 30 tablet 0     Patient Stressors: Medication change or noncompliance  Patient Strengths: Physical Health Supportive family/friends  Treatment Modalities: Medication Management, Group therapy, Case management,  1 to 1 session with clinician, Psychoeducation, Recreational therapy.   Physician Treatment Plan for Primary  Diagnosis: <principal problem not specified> Long Term Goal(s):     Short Term Goals:    Medication Management: Evaluate patient's response, side effects, and tolerance of medication regimen.  Therapeutic Interventions: 1 to 1 sessions, Unit Group sessions and Medication administration.  Evaluation of Outcomes: Not Met  Physician Treatment Plan for Secondary Diagnosis: Active Problems:   Disorganized schizophrenia ([place])  Long Term Goal(s):     Short Term Goals:       Medication Management: Evaluate patient's response, side effects, and tolerance of medication regimen.  Therapeutic Interventions: 1 to 1 sessions, Unit Group sessions and Medication administration.  Evaluation of Outcomes: Not Met   RN Treatment Plan for Primary Diagnosis: <principal problem not specified> Long Term Goal(s): Knowledge of disease and therapeutic regimen to maintain health will improve  Short Term Goals: Ability to remain free from injury will improve, Ability to verbalize frustration and anger appropriately will improve, Ability to demonstrate self-control, Ability to disclose and discuss suicidal ideas, Ability to identify and develop effective coping behaviors will improve and Compliance with prescribed medications will improve  Medication Management: RN will administer medications as ordered by provider, will assess and evaluate patient's response and provide education to patient for prescribed medication. RN will report any adverse and/or side effects to prescribing provider.  Therapeutic Interventions: 1 on 1 counseling sessions, Psychoeducation, Medication administration, Evaluate responses to treatment, Monitor vital signs and CBGs as ordered, Perform/monitor CIWA, COWS,  AIMS and Fall Risk screenings as ordered, Perform wound care treatments as ordered.  Evaluation of Outcomes: Not Met   LCSW Treatment Plan for Primary Diagnosis: <principal problem not specified> Long Term Goal(s): Safe  transition to appropriate next level of care at discharge, Engage patient in therapeutic group addressing interpersonal concerns.  Short Term Goals: Engage patient in aftercare planning with referrals and resources, Increase social support, Increase ability to appropriately verbalize feelings, Increase emotional regulation, Facilitate acceptance of mental health diagnosis and concerns, Identify triggers associated with mental health/substance abuse issues and Increase skills for wellness and recovery  Therapeutic Interventions: Assess for all discharge needs, 1 to 1 time with Social worker, Explore available resources and support systems, Assess for adequacy in community support network, Educate family and significant other(s) on suicide prevention, Complete Psychosocial Assessment, Interpersonal group therapy.  Evaluation of Outcomes: Not Met   Progress in Treatment: Attending groups: No. Participating in groups: No. Taking medication as prescribed: Yes. Toleration medication: Yes. Family/Significant other contact made: No, will contact:  when given permission. Patient understands diagnosis: No. Discussing patient identified problems/goals with staff: No. Medical problems stabilized or resolved: Yes. Denies suicidal/homicidal ideation: Yes. Issues/concerns per patient self-inventory: No. Other: None.  New problem(s) identified: No, Describe:  none.  New Short Term/Long Term Goal(s): elimination of symptoms of psychosis, medication management for mood stabilization; elimination of SI thoughts; development of comprehensive mental wellness/sobriety plan.  Patient Goals: Patient was unable to attend treatment team meeting. Noted to be disorganized. Patient will continue to be assessed.  Discharge Plan or Barriers: Patient plans to return to his group home upon discharge.  Reason for Continuation of Hospitalization: Medication stabilization  Estimated Length of Stay: 1-7  days  Recreational Therapy: Patient Stressors: N/A Patient Goal: Patient will engage in groups without prompting or encouragement from LRT x3 group sessions within 5 recreation therapy group sessions.  Attendees: Patient:  02/25/2020 10:04 AM  Physician: Selina Cooley, MD  02/25/2020 10:04 AM  Nursing: Steele Sizer., RN 02/25/2020 10:04 AM  RN Care Manager: 02/25/2020 10:04 AM  Social Worker: Chalmers Guest. Guerry Bruin, MSW, [place], [place] 02/25/2020 10:04 AM  Recreational Therapist: Devin Going, LRT  02/25/2020 10:04 AM  Other: Assunta Curtis, MSW, LCSW 02/25/2020 10:04 AM  Other: Paulla Dolly, MSW, [place], [place] 02/25/2020 10:04 AM  Other: 02/25/2020 10:04 AM    Scribe for Treatment Team: Shirl Harris, LCSW 02/25/2020 10:04 AM

## 2020-02-25 NOTE — B[facility] Group Notes (Signed)
LCSW Group Therapy Note  02/25/2020 2:18 PM  Type of Therapy and Topic:  Group Therapy:  Feelings around Relapse and Recovery  Participation Level:  Minimal   Description of Group:    Patients in this group will discuss emotions they experience before and after a relapse. They will process how experiencing these feelings, or avoidance of experiencing them, relates to having a relapse. Facilitator will guide patients to explore emotions they have related to recovery. Patients will be encouraged to process which emotions are more powerful. They will be guided to discuss the emotional reaction significant others in their lives may have to their relapse or recovery. Patients will be assisted in exploring ways to respond to the emotions of others without this contributing to a relapse.  Therapeutic Goals: 1. Patient will identify two or more emotions that lead to a relapse for them 2. Patient will identify two emotions that result when they relapse 3. Patient will identify two emotions related to recovery 4. Patient will demonstrate ability to communicate their needs through discussion and/or role plays   Summary of Patient Progress: Patient was present for the entirety of group. Patient attempted to participate in group introduction and icebreaker. Patient's speech was disorganized and indistinguishable.    Therapeutic Modalities:   Cognitive Behavioral Therapy Solution-Focused Therapy Assertiveness Training Relapse Prevention Therapy   Gwenevere Ghazi, MSW, [place], [place] 02/25/2020 2:18 PM

## 2020-02-26 NOTE — BHH Counselor (Signed)
CSW made second attempt to complete PSA, met with patient in his room and inquired about reason for hospitalization. When asked, patient responded with disorganized speech and low volume; responses were indistinguishable. CSW will make final attempt to complete PSA with patient before 72 hours of inpatient stay.    Signed:  Durenda Hurt, MSW, LCSWA, LCASA 02/26/2020 9:00 AM

## 2020-02-26 NOTE — Progress Notes (Signed)
Patient alert and oriented x 2 with periods of confusion to place and situation, he denies SI/HI/AVH, his thoughts are disorganized, speech is tangential, he forward very little during conversation with him and he avoids eye contact. Patient appears responding to internal stimuli, 15 minutes safety checks maintained will continue to monitor.

## 2020-02-26 NOTE — Progress Notes (Signed)
[facility] MD Progress Note  02/26/2020 11:38 AM Shane Nash  MRN:  [id number] Subjective:   Patient was seen in his room. His room was in disarray and he appeared unkempt once he took the blanket off of his head. When questions were asked, patient had long pauses and mumbled to himself. Finally denies experiencing auditory or visual hallucinations. Angelique Blonder various forms of delusions. Denies suicide or feeling violent. Reports no concerns. Will not say if he has any side effects on his medication. Patient is an unreliable historian. Minimal interactions. He puts his blanket over his head once again and stops responding. Unable to ascertain sleep or anxiety. He took his invega today.    Principal Problem: Disorganized schizophrenia (HCC) Diagnosis: Principal Problem:   Disorganized schizophrenia (HCC) Active Problems:   Polydipsia   Tobacco use disorder   Disorganized behavior  Total Time spent with patient: 25 minutes   Past Medical History:  Past Medical History:  Diagnosis Date  . Schizophrenia (HCC)    History reviewed. No pertinent surgical history. Family History: History reviewed. No pertinent family history.   Social History:  Social History   Substance and Sexual Activity  Alcohol Use None     Social History   Substance and Sexual Activity  Drug Use No    Social History   Socioeconomic History  . Marital status: Single    Spouse name: Not on file  . Number of children: Not on file  . Years of education: Not on file  . Highest education level: Not on file  Occupational History  . Not on file  Tobacco Use  . Smoking status: Current Every Day Smoker    Packs/day: 0.25    Years: 15.00    Pack years: 3.75    Types: Cigarettes  . Smokeless tobacco: Never Used  . Tobacco comment: Cannot state the amount he smokes per day  Vaping Use  . Vaping Use: Never used  Substance and Sexual Activity  . Alcohol use: Not on file  . Drug use: No  . Sexual activity: Not  Currently  Other Topics Concern  . Not on file  Social History Narrative  . Not on file   Social Determinants of Health   Financial Resource Strain: Not on file  Food Insecurity: Not on file  Transportation Needs: Not on file  Physical Activity: Not on file  Stress: Not on file  Social Connections: Not on file   Additional Social History:                      Current Medications: Current Facility-Administered Medications  Medication Dose Route Frequency Provider Last Rate Last Admin  . acetaminophen (TYLENOL) tablet 650 mg  650 mg Oral Q6H PRN [name], [name], MD      . alum & mag hydroxide-simeth (MAALOX/MYLANTA) 200-200-20 MG/5ML suspension 30 mL  30 mL Oral Q4H PRN [name], [name], MD      . divalproex (DEPAKOTE ER) 24 hr tablet 500 mg  500 mg Oral QHS [name], [name], MD   500 mg at 02/25/20 2239  . magnesium hydroxide (MILK OF MAGNESIA) suspension 30 mL  30 mL Oral Daily PRN [name], [name], MD      . paliperidone (INVEGA) 24 hr tablet 9 mg  9 mg Oral Daily [name], [name], MD   9 mg at 02/26/20 5400    Lab Results: No results found for this or any previous visit (from the past 48 hour(s)).  Blood Alcohol level:  Lab Results  Component Value Date   ETH <10 05/15/2019   ETH <10 05/07/2019    Metabolic Disorder Labs: Lab Results  Component Value Date   HGBA1C 5.2 02/23/2020   MPG 102.54 02/23/2020   MPG 102.54 03/27/2018   No results found for: PROLACTIN Lab Results  Component Value Date   CHOL 193 03/27/2018   TRIG 154 (H) 03/27/2018   HDL 51 03/27/2018   CHOLHDL 3.8 03/27/2018   VLDL 31 03/27/2018   LDLCALC 111 (H) 03/27/2018   LDLCALC 102 (H) 01/31/2017    Physical Findings: AIMS:  , ,  ,  ,    CIWA:    COWS:        Psychiatric Specialty Exam: Physical Exam  Review of Systems  Blood pressure 122/87, pulse 72, temperature 98.3 F (36.8 C), temperature source Oral, resp. rate 18, height 5' 8.5" (1.74 m), weight 85 kg, SpO2 100 %.Body mass  index is 28.09 kg/m.   General Appearance:Disheveled  Eye Contact:Minimal  Speech:Garbled  Volume:Decreased  Mood:Dysphoric  Affect:Constricted  Thought Process:Disorganized  Orientation:Other:knows name, location  Thought Content:Illogical and Paranoid Ideation  Suicidal Thoughts:No  Homicidal Thoughts:No  Memory:Immediate;Fair Recent;Poor Remote;Poor  Judgement:Impaired  Insight:Shallow  Psychomotor Activity:Decreased  Concentration:Concentration:Poorand Attention Span: Poor  Recall:Poor  Fund of Knowledge:Poor  Language:Poor  Akathisia:Negative  Handed:Right  AIMS (if indicated):   Assets:Desire for Improvement Housing Resilience  ADL's:Impaired  Cognition:Impaired,Mild  Sleep:Number of Hours: 5.5       Treatment Plan Summary: Daily contact with patient to assess and evaluate symptoms and progress in treatment and Medication management PLAN OF CARE:Patient is a 40 year old male with history of schizophrenia presenting with psychosis, disorganization, and inability to care for self. Resume Invega 9 mg daily with plan to transition to LAI if tolerated, Depakote 500 QHS.  Observation Level/Precautions:  15 minute checks  Laboratory:  lipid panel, hemoglobin a1c  Psychotherapy:    Medications:    Consultations:    Discharge Concerns:    Estimated LOS:     Treatment Plan Summary: 1/29 No change Will offer Invega LAI tomorrow if pt is willing  Reggie Pile, MD 02/26/2020, 11:38 AM

## 2020-02-26 NOTE — Progress Notes (Signed)
D: Pt alert and oriented. Pt has disorganized thought and was hesitant to take his medications this morning, however with some coaching did agree to take his medication. Pt however did not want to he out of bed for breakfast and covered up and went back to sleep. Pt denies experiencing any pain at this time. Pt denies experiencing any SI/HI, or AVH at this time however can observed as responding to internal stimuli.   This pt as the day progressed could be observed outside of his room and in the dayroom more. Pt also attended lunch and dinner when served.   A: Scheduled medications administered to pt, per MD orders. Support and encouragement provided. Frequent verbal contact made. Routine safety checks conducted q15 minutes.   R: No adverse drug reactions noted. Pt verbally contracts for safety at this time. Pt complaint with medications with some coaching. Pt interacts minimally with others on the unit. Pt remains safe at this time. Will continue to monitor.

## 2020-02-26 NOTE — [facility] Group Notes (Signed)
LCSW Group Therapy Note  02/26/2020 1:39 PM  Type of Therapy and Topic:  Group Therapy: Avoiding Self-Sabotaging and Enabling Behaviors  Participation Level:  Did Not Attend   Description of Group:   In this group, patients will learn how to identify obstacles, self-sabotaging and enabling behaviors, as well as: what are they, why do we do them and what needs these behaviors meet. Discuss unhealthy relationships and how to have positive healthy boundaries with those that sabotage and enable. Explore aspects of self-sabotage and enabling in yourself and how to limit these self-destructive behaviors in everyday life.   Therapeutic Goals: 1. Patient will identify one obstacle that relates to self-sabotage and enabling behaviors 2. Patient will identify one personal self-sabotaging or enabling behavior they did prior to admission 3. Patient will state a plan to change the above identified behavior 4. Patient will demonstrate ability to communicate their needs through discussion and/or role play.   Summary of Patient Progress: Patient did not attend group despite encouraged participation.   Therapeutic Modalities:   Cognitive Behavioral Therapy Person-Centered Therapy Motivational Interviewing   Gwenevere Ghazi, MSW, [place], [place] 02/26/2020 1:39 PM

## 2020-02-27 NOTE — Plan of Care (Signed)
  Problem: Education: Goal: Emotional status will improve Outcome: Progressing   Problem: Coping: Goal: Coping ability will improve Outcome: Progressing Goal: Will verbalize feelings Outcome: Progressing   Problem: Self-Care: Goal: Ability to participate in self-care as condition permits will improve Outcome: Progressing

## 2020-02-27 NOTE — Plan of Care (Signed)
  Problem: Education: Goal: Knowledge of [place] Education information/materials will improve Outcome: Not Progressing Goal: Mental status will improve Outcome: Not Progressing   Problem: Activity: Goal: Interest or engagement in activities will improve Outcome: Not Progressing   Problem: Coping: Goal: Ability to verbalize frustrations and anger appropriately will improve Outcome: Progressing Goal: Ability to demonstrate self-control will improve Outcome: Progressing   Problem: Safety: Goal: Periods of time without injury will increase Outcome: Progressing

## 2020-02-27 NOTE — Progress Notes (Signed)
Patient has been isolative to his room this evening. Reports during medication administration that he needs to get rest while he is here and needs to work on getting plenty of rest. He denies SI  HI  AVH depression anxiety and pain at this encounter. Although he denies he as been observed engaging in conversation with himself while alone his room alone. He is med compliant and tolerated his medication without incident. He remains safe on the unit with 15 minute safety checks and was encouraged to come to staff he needed anything.    Cleo Butler-Nicholson, LPN

## 2020-02-27 NOTE — [facility] Counselor (Signed)
Adult Comprehensive Assessment  Patient ID: Shane Nash, male   DOB: [birth year], 40 y.o.   MRN: [id number]  Information Source: Information source:  (completed with patient's gaurdian)  Current Stressors:  Patient states their primary concerns and needs for treatment are:: "[facility] would not give medication." Patient states their goals for this hospitilization and ongoing recovery are:: medication compliance Educational / Learning stressors: none reported Employment / Job issues: none reported Family Relationships: none reported Surveyor, quantity / Lack of resources (include bankruptcy): none reported Housing / Lack of housing: none reported Physical health (include injuries & life threatening diseases): none reported Social relationships: none reported Substance abuse: none reported Bereavement / Loss: "uncle who raised him died 2 weeks ago"  Living/Environment/Situation:  Living Arrangements: Other relatives Living conditions (as described by patient or guardian): "supportive place for him" Who else lives in the home?: Sister and her 3 kids What is atmosphere in current home: Supportive  Family History:  Marital status: Single Are you sexually active?: No What is your sexual orientation?: Heterosexual  Has your sexual activity been affected by drugs, alcohol, medication, or emotional stress?: Pt denies. Does patient have children?: No  Childhood History:  By whom was/is the patient raised?: Both parents Additional childhood history information: none reported Description of patient's relationship with caregiver when they were a child: Pt previous reports, "it was all right". How were you disciplined when you got in trouble as a child/adolescent?: "was not really disciplined" Does patient have siblings?: Yes Number of Siblings: 6 Description of patient's current relationship with siblings: "all of his (siblings) help out with him" Did patient suffer any  verbal/emotional/physical/sexual abuse as a child?: No Did patient suffer from severe childhood neglect?: No Has patient ever been sexually abused/assaulted/raped as an adolescent or adult?: No Was the patient ever a victim of a crime or a disaster?: No Witnessed domestic violence?: No 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?: No  Education:  Highest grade of school patient has completed: possibly GED Currently a student?: No Learning disability?: Yes What learning problems does patient have?: unknown  Employment/Work Situation:   Employment situation: On disability Why is patient on disability: MH related disability How long has patient been on disability: Pt reports "years". Patient's job has been impacted by current illness: Yes Describe how patient's job has been impacted: Pt is unable to work due to his mental health.  What is the longest time patient has a held a job?: Pt was unable to provide information.  Where was the patient employed at that time?: Pt was unable to provide information.  Has patient ever been in the [employer]?: No  Financial Resources:   Financial resources: Programme researcher, broadcasting/film/video Does patient have a Lawyer or guardian?: Yes Name of representative payee or guardian: Shane Nash, guardian, sister  Alcohol/Substance Abuse:   What has been your use of drugs/alcohol within the last 12 months?: unknown If attempted suicide, did drugs/alcohol play a role in this?: No Alcohol/Substance Abuse Treatment Hx: (P) Denies past history Has alcohol/substance abuse ever caused legal problems?: (P) No  Social Support System:   Patient's Community Support System: (P) Good Describe Community Support System: (P) siblings, uncles and aunt help out Type of faith/religion: (P) "church, thats all i know . . . christian" How does patient's faith help to cope with current illness?: (P) unknown  Leisure/Recreation:   Do You Have Hobbies?: (P)  No  Strengths/Needs:   What is the patient's perception of their strengths?: (P)  none reported Patient states they can use these personal strengths during their treatment to contribute to their recovery: (P) none reported Patient states these barriers may affect/interfere with their treatment: (P) none reported Patient states these barriers may affect their return to the community: (P) none reported Other important information patient would like considered in planning for their treatment: (P) none reported  Discharge Plan:   Currently receiving community mental health services: (P) Yes (From Whom) Engineer, site [facility]) Patient states concerns and preferences for aftercare planning are: (P) interested in new ACTT services Patient states they will know when they are safe and ready for discharge when: (P) unknown Does patient have access to transportation?: (P) Yes Does patient have financial barriers related to discharge medications?: (P) No Will patient be returning to same living situation after discharge?: (P) Yes (return to sister/gaurdians residence)  Summary/Recommendations:   Summary and Recommendations (to be completed by the evaluator): Patient is a 40 year old male from [place], [place] [facility] [place]), patient's sister Shane Nash) is his appointed guardian.   PSA was completed with guardian as patient's speech is disorganized and indistinguishable. Per guardian report, patient is currently receiving disability ($785/monthly), he is not employed. Guardian admitted patient to ED reporting he has been off his medication as his ACTT would not provide medication.  Recommendations include: crisis stabilization, therapeutic milieu, encourage group attendance and participation, medication management for detox/mood stabilization and development of comprehensive mental wellness/sobriety plan. Patient continues to present with disorganized speech and low volume, patient unable to respond to  questioning and shows poor hygiene and cleanliness. CSW has been provided verbal consent by guardian to contact current ACTT and coordinate aftercare. Guardian is interested in exploring alternative ACTT teams. Patient can return to guardian's residence who is exploring group home placement.  Corky Crafts. 02/27/2020

## 2020-02-27 NOTE — [facility] Suicide Risk Assessment (Signed)
[facility] INPATIENT:  Family/Significant Other Suicide Prevention Education  Suicide Prevention Education:  Education Completed; Ashly Deangelo,  (Sister/Guardian) has been identified by the patient as the family member/significant other with whom the patient will be residing, and identified as the person(s) who will aid the patient in the event of a mental health crisis (suicidal ideations/suicide attempt).  With written consent from the patient, the family member/significant other has been provided the following suicide prevention education, prior to the and/or following the discharge of the patient.  The suicide prevention education provided includes the following:  Suicide risk factors  Suicide prevention and interventions  National Suicide Hotline telephone number  [facility] assessment telephone number  [facility] Emergency Assistance 911  [facility] and/or Residential Mobile Crisis Unit telephone number  Request made of family/significant other to:  Remove weapons (e.g., guns, rifles, knives), all items previously/currently identified as safety concern.    Remove drugs/medications (over-the-counter, prescriptions, illicit drugs), all items previously/currently identified as a safety concern.  The family member/significant other verbalizes understanding of the suicide prevention education information provided.  The family member/significant other agrees to remove the items of safety concern listed above.  Corky Crafts 02/27/2020, 10:40 AM

## 2020-02-27 NOTE — Progress Notes (Addendum)
[facility] MD Progress Note  02/27/2020 8:34 AM Shane Nash  MRN:  [id number] Subjective:    1/30 Patient was seen resting in his bed this morning. He told the nurse yesterday that he wants to get as much rest as possible while here. He tends to isolate but comes out for meals. Today he tells me that he is doing all right with his head turned to the other side of this provider. He rambles but able to decipher that he stays with his family and they had to go back there again. Denies any particular stressors. Denies depression anxiety and psychosis. He is taking his medication as prescribed. Sleeping well.  1/29 Patient was seen in his room. His room was in disarray and he appeared unkempt once he took the blanket off of his head. When questions were asked, patient had long pauses and mumbled to himself. Finally denies experiencing auditory or visual hallucinations. Angelique Blonder various forms of delusions. Denies suicide or feeling violent. Reports no concerns. Will not say if he has any side effects on his medication. Patient is an unreliable historian. Minimal interactions. He puts his blanket over his head once again and stops responding. Unable to ascertain sleep or anxiety. He took his invega today.    Principal Problem: Disorganized schizophrenia (HCC) Diagnosis: Principal Problem:   Disorganized schizophrenia (HCC) Active Problems:   Polydipsia   Tobacco use disorder   Disorganized behavior  Total Time spent with patient: 25 minutes   Past Medical History:  Past Medical History:  Diagnosis Date  . Schizophrenia (HCC)    History reviewed. No pertinent surgical history. Family History: History reviewed. No pertinent family history.   Social History:  Social History   Substance and Sexual Activity  Alcohol Use None     Social History   Substance and Sexual Activity  Drug Use No    Social History   Socioeconomic History  . Marital status: Single    Spouse name: Not on file  .  Number of children: Not on file  . Years of education: Not on file  . Highest education level: Not on file  Occupational History  . Not on file  Tobacco Use  . Smoking status: Current Every Day Smoker    Packs/day: 0.25    Years: 15.00    Pack years: 3.75    Types: Cigarettes  . Smokeless tobacco: Never Used  . Tobacco comment: Cannot state the amount he smokes per day  Vaping Use  . Vaping Use: Never used  Substance and Sexual Activity  . Alcohol use: Not on file  . Drug use: No  . Sexual activity: Not Currently  Other Topics Concern  . Not on file  Social History Narrative  . Not on file   Social Determinants of Health   Financial Resource Strain: Not on file  Food Insecurity: Not on file  Transportation Needs: Not on file  Physical Activity: Not on file  Stress: Not on file  Social Connections: Not on file   Additional Social History:                      Current Medications: Current Facility-Administered Medications  Medication Dose Route Frequency Provider Last Rate Last Admin  . acetaminophen (TYLENOL) tablet 650 mg  650 mg Oral Q6H PRN [name], [name], MD      . alum & mag hydroxide-simeth (MAALOX/MYLANTA) 200-200-20 MG/5ML suspension 30 mL  30 mL Oral Q4H PRN [name], [name], MD      .  divalproex (DEPAKOTE ER) 24 hr tablet 500 mg  500 mg Oral QHS [name], [name], MD   500 mg at 02/26/20 2116  . magnesium hydroxide (MILK OF MAGNESIA) suspension 30 mL  30 mL Oral Daily PRN [name], [name], MD      . paliperidone (INVEGA) 24 hr tablet 9 mg  9 mg Oral Daily [name], [name], MD   9 mg at 02/26/20 1610    Lab Results: No results found for this or any previous visit (from the past 48 hour(s)).  Blood Alcohol level:  Lab Results  Component Value Date   ETH <10 05/15/2019   ETH <10 05/07/2019    Metabolic Disorder Labs: Lab Results  Component Value Date   HGBA1C 5.2 02/23/2020   MPG 102.54 02/23/2020   MPG 102.54 03/27/2018   No results found for:  PROLACTIN Lab Results  Component Value Date   CHOL 193 03/27/2018   TRIG 154 (H) 03/27/2018   HDL 51 03/27/2018   CHOLHDL 3.8 03/27/2018   VLDL 31 03/27/2018   LDLCALC 111 (H) 03/27/2018   LDLCALC 102 (H) 01/31/2017    Physical Findings: AIMS:  , ,  ,  ,    CIWA:    COWS:        Psychiatric Specialty Exam: Physical Exam  Review of Systems  Blood pressure (!) 135/122, pulse 84, temperature 98.3 F (36.8 C), temperature source Oral, resp. rate 18, height 5' 8.5" (1.74 m), weight 85 kg, SpO2 100 %.Body mass index is 28.09 kg/m.   General Appearance:Disheveled  Eye Contact:Minimal  Speech:Garbled  Volume:Decreased  Mood:"I'm doing alright..."  Affect:more neutral  Thought Process:Disorganized  Orientation:Other:knows name, location  Thought Content:Illogical and Paranoid Ideation  Suicidal Thoughts:No  Homicidal Thoughts:No  Memory:Immediate;Fair Recent;Poor Remote;Poor  Judgement:Impaired  Insight:Shallow  Psychomotor Activity:Decreased  Concentration:Concentration:Poorand Attention Span: Poor  Recall:Poor  Fund of Knowledge:Poor  Language:Poor  Akathisia:Negative  Handed:Right  AIMS (if indicated):   Assets:Desire for Improvement Housing Resilience  ADL's:Impaired  Cognition:Impaired,Mild  Sleep:Number of Hours: 5.5       Treatment Plan Summary: Daily contact with patient to assess and evaluate symptoms and progress in treatment and Medication management PLAN OF CARE:Patient is a 40 year old male with history of schizophrenia presenting with psychosis, disorganization, and inability to care for self. Resume Invega 9 mg daily with plan to transition to LAI if tolerated, Depakote 500 QHS.  Observation Level/Precautions:  15 minute checks  Laboratory:  lipid panel, hemoglobin a1c  Psychotherapy:    Medications:    Consultations:    Discharge Concerns:    Estimated LOS:     Treatment  Plan Summary: 1/29 No change Will offer Invega LAI tomorrow if pt is willing  1/30 Patient declines LAI.  Monitor vitals Spoke with SW about the patient today. Patient lacks capacity as he has difficulty with understanding, applying, and logically reasoning. Poor insight into psychopathology and decision making.   Reggie Pile, MD 02/27/2020, 8:34 AM

## 2020-02-27 NOTE — BHH Group Notes (Signed)
LCSW Group Therapy Note  02/27/2020 4:17 PM  Type of Therapy/Topic:  Group Therapy:  Feelings about Diagnosis  Participation Level:  Did Not Attend   Description of Group:   This group will allow patients to explore their thoughts and feelings about diagnoses they have received. Patients will be guided to explore their level of understanding and acceptance of these diagnoses. Facilitator will encourage patients to process their thoughts and feelings about the reactions of others to their diagnosis and will guide patients in identifying ways to discuss their diagnosis with significant others in their lives. This group will be process-oriented, with patients participating in exploration of their own experiences, giving and receiving support, and processing challenge from other group members.   Therapeutic Goals: 1. Patient will demonstrate understanding of diagnosis as evidenced by identifying two or more symptoms of the disorder 2. Patient will be able to express two feelings regarding the diagnosis 3. Patient will demonstrate their ability to communicate their needs through discussion and/or role play  Summary of Patient Progress: Patient did not attend group despite encouraged participation.    Therapeutic Modalities:   Cognitive Behavioral Therapy Brief Therapy Feelings Identification   Gwenevere Ghazi, MSW, [place], Bridget Hartshorn 02/27/2020 4:17 PM

## 2020-02-27 NOTE — Progress Notes (Signed)
Patient isolative to self and room. Pt had several incontinent episodes throughtout last pm. This Clinical research associate assisted patient with shower and changed bed linens. Pt able to follow commands and make needs known. Pt continues to be pre-occupied. Was visible in milieu at times today with no interaction with staff or peers. Pt able to answer questions. Pt reports this am that he needed sleep and sleep was more important than vital signs. Pt denies any SI, HI, Avh.  Encouragement and support provided. Safety checks maintained. Medications given as prescribed. Pt receptive and remains safe on unit with q 15 min checks.

## 2020-02-28 DIAGNOSIS — F201 Disorganized schizophrenia: Secondary | ICD-10-CM | POA: Diagnosis not present

## 2020-02-28 NOTE — B[facility] Group Notes (Signed)
LCSW Group Therapy Note   02/28/2020 2:01 PM  Type of Therapy and Topic:  Group Therapy:  Overcoming Obstacles   Participation Level:  Did Not Attend    Description of Group:    In this group patients will be encouraged to explore what they see as obstacles to their own wellness and recovery. They will be guided to discuss their thoughts, feelings, and behaviors related to these obstacles. The group will process together ways to cope with barriers, with attention given to specific choices patients can make. Each patient will be challenged to identify changes they are motivated to make in order to overcome their obstacles. This group will be process-oriented, with patients participating in exploration of their own experiences as well as giving and receiving support and challenge from other group members.   Therapeutic Goals: 1. Patient will identify personal and current obstacles as they relate to admission. 2. Patient will identify barriers that currently interfere with their wellness or overcoming obstacles.  3. Patient will identify feelings, thought process and behaviors related to these barriers. 4. Patient will identify two changes they are willing to make to overcome these obstacles:      Summary of Patient Progress X   Therapeutic Modalities:   Cognitive Behavioral Therapy Solution Focused Therapy Motivational Interviewing Relapse Prevention Therapy  Penni Homans, MSW, LCSW 02/28/2020 2:01 PM

## 2020-02-28 NOTE — Plan of Care (Signed)
Patient appears calm with disorganized thoughts and not able to make a logical conversation.Patient awake and visible in the milieu with minimal interactions with staff & peers. Patient simply standing at the nurses station for no reason when asked. Noted patient talking to himself in his room.Denies SI,HI and AVH. Compliant with medications.Support and encouragement given.

## 2020-02-28 NOTE — Progress Notes (Signed)
Recreation Therapy Notes   INPATIENT RECREATION THERAPY ASSESSMENT  Patient Details Name: AUBREY VOONG MRN: [id number] DOB: 1980/11/22 Today's Date: 02/28/2020       Information Obtained From: Chart Review  Able to Participate in Assessment/Interview: No  Patient Presentation: Confused,Withdrawn,Disheveled  Reason for Admission (Per Patient): Active Symptoms  Patient Stressors:    Coping Skills:   Prayer  Leisure Interests (2+):   (None)  Frequency of Recreation/Participation:    Awareness of Community Resources:  Yes  Community Resources:  Other (Comment) ([facility]))  Current Use:    If no, Barriers?:    Expressed Interest in [employer] Information:    [place] of Residence:  Film/video editor  Patient Main Form of Transportation: Other (Comment) (Sister)  Patient Strengths:  N/A  Patient Identified Areas of Improvement:  N/A  Patient Goal for Hospitalization:  Medication compliance  Current SI (including self-harm):  No  Current HI:  No  Current AVH: No  Staff Intervention Plan: Group Attendance,Collaborate with Interdisciplinary Treatment Team  Consent to Intern Participation: N/A  Calianna Kim 02/28/2020, 11:31 AM

## 2020-02-28 NOTE — Progress Notes (Signed)
Patient pacing in the hall in front of nurses station holding clothes. Was approached by this nurse inquiring if he wanted to wash his clothes and patient responded yes. New pair of scrubs and underwear provided and patient prompted to put his clothes in hamper to be prepared for getting washed.       Cleo Francena Hanly, LPN

## 2020-02-28 NOTE — Plan of Care (Signed)
  Problem: Education: Goal: Emotional status will improve Outcome: Progressing Goal: Mental status will improve Outcome: Progressing Goal: Verbalization of understanding the information provided will improve Outcome: Progressing   Problem: Activity: Goal: Interest or engagement in activities will improve Outcome: Progressing Goal: Sleeping patterns will improve Outcome: Progressing   Problem: Coping: Goal: Ability to verbalize frustrations and anger appropriately will improve Outcome: Progressing Goal: Ability to demonstrate self-control will improve Outcome: Progressing   Problem: Health Behavior/Discharge Planning: Goal: Identification of resources available to assist in meeting health care needs will improve Outcome: Progressing Goal: Compliance with treatment plan for underlying cause of condition will improve Outcome: Progressing   Problem: Physical Regulation: Goal: Ability to maintain clinical measurements within normal limits will improve Outcome: Progressing   Problem: Safety: Goal: Periods of time without injury will increase Outcome: Progressing   Problem: Activity: Goal: Will verbalize the importance of balancing activity with adequate rest periods Outcome: Progressing   Problem: Education: Goal: Will be free of psychotic symptoms Outcome: Progressing Goal: Knowledge of the prescribed therapeutic regimen will improve Outcome: Progressing   Problem: Coping: Goal: Coping ability will improve Outcome: Progressing Goal: Will verbalize feelings Outcome: Progressing   Problem: Health Behavior/Discharge Planning: Goal: Compliance with prescribed medication regimen will improve Outcome: Progressing   Problem: Nutritional: Goal: Ability to achieve adequate nutritional intake will improve Outcome: Progressing   Problem: Role Relationship: Goal: Ability to communicate needs accurately will improve Outcome: Progressing Goal: Ability to interact with others  will improve Outcome: Progressing   Problem: Safety: Goal: Ability to redirect hostility and anger into socially appropriate behaviors will improve Outcome: Progressing Goal: Ability to remain free from injury will improve Outcome: Progressing   Problem: Self-Care: Goal: Ability to participate in self-care as condition permits will improve Outcome: Progressing   Problem: Self-Concept: Goal: Will verbalize positive feelings about self Outcome: Progressing

## 2020-02-28 NOTE — Progress Notes (Signed)
Recreation Therapy Notes   Date: 02/28/2020  Time: 9:30 am   Location: Craft room     Behavioral response: N/A   Intervention Topic: Problem Solving    Discussion/Intervention: Patient did not attend group.   Clinical Observations/Feedback:  Patient did not attend group.   Ryszard Socarras LRT/CTRS          Kayman Snuffer 02/28/2020 10:54 AM

## 2020-02-28 NOTE — Progress Notes (Signed)
Patient is more active on the unit this evening eating and watching TV in the dayroom with other peers, although he is not interacting.  He is med compliant and took his medication without incident.  He can answer basic question with small sentences. Anything beyond that his words become incoherent and hard to understand. He is pleasant with no behavioral issues and easy to redirect. He remains safe on the unit with 15 minute safety checks.     Cleo Butler-Nicholson, LPN

## 2020-02-28 NOTE — Progress Notes (Signed)
[facility] MD Progress Note  02/28/2020 2:27 PM Shane Nash  MRN:  [id number] Subjective:  Patient is a 40 year old male with history of schizophrenia brought to the hospital by his sister for confusion, poor self-care, and disorganized behavior. Overnight, patient had multiple incontinent episodes, but no acute events. He was compliant with night time medications. He is having difficulty performing ADLs at this time.   Patient seen one-on-one at bedside. He can answer basic questions in short sentences today. He notes he slept overnight, and ate pancakes for breakfast. After this, he begins speaking quickly in a mumbled voice that are incoherent. He remains grossly disorganized and psychotic on exam. He also appears to be having difficulty with his ADLs as toothpaste covering his hands again this morning. He is later seen making hand gestures and speaking aloud as if in conversation while looking at the hallway wall. He does appear to be eating his meals.   Principal Problem: Disorganized schizophrenia (HCC) Diagnosis: Principal Problem:   Disorganized schizophrenia (HCC) Active Problems:   Polydipsia   Tobacco use disorder   Disorganized behavior  Total Time spent with patient: 30 minutes  Past Psychiatric History: Patient has a past history of schizophrenia. Multiple prior admissions. Often decompensates badly off of medicine.  Past Medical History:  Past Medical History:  Diagnosis Date  . Schizophrenia (HCC)    History reviewed. No pertinent surgical history. Family History: History reviewed. No pertinent family history. Family Psychiatric  History: None reported Social History:  Social History   Substance and Sexual Activity  Alcohol Use None     Social History   Substance and Sexual Activity  Drug Use No    Social History   Socioeconomic History  . Marital status: Single    Spouse name: Not on file  . Number of children: Not on file  . Years of education: Not on file   . Highest education level: Not on file  Occupational History  . Not on file  Tobacco Use  . Smoking status: Current Every Day Smoker    Packs/day: 0.25    Years: 15.00    Pack years: 3.75    Types: Cigarettes  . Smokeless tobacco: Never Used  . Tobacco comment: Cannot state the amount he smokes per day  Vaping Use  . Vaping Use: Never used  Substance and Sexual Activity  . Alcohol use: Not on file  . Drug use: No  . Sexual activity: Not Currently  Other Topics Concern  . Not on file  Social History Narrative  . Not on file   Social Determinants of Health   Financial Resource Strain: Not on file  Food Insecurity: Not on file  Transportation Needs: Not on file  Physical Activity: Not on file  Stress: Not on file  Social Connections: Not on file   Additional Social History:                         Sleep: Fair  Appetite:  Good  Current Medications: Current Facility-Administered Medications  Medication Dose Route Frequency Provider Last Rate Last Admin  . acetaminophen (TYLENOL) tablet 650 mg  650 mg Oral Q6H PRN [name], [name], MD      . alum & mag hydroxide-simeth (MAALOX/MYLANTA) 200-200-20 MG/5ML suspension 30 mL  30 mL Oral Q4H PRN [name], [name], MD      . divalproex (DEPAKOTE ER) 24 hr tablet 500 mg  500 mg Oral QHS [name], [name], MD  500 mg at 02/27/20 2128  . magnesium hydroxide (MILK OF MAGNESIA) suspension 30 mL  30 mL Oral Daily PRN [name], [name], MD      . paliperidone (INVEGA) 24 hr tablet 9 mg  9 mg Oral Daily [name], [name], MD   9 mg at 02/28/20 9767    Lab Results: No results found for this or any previous visit (from the past 48 hour(s)).  Blood Alcohol level:  Lab Results  Component Value Date   ETH <10 05/15/2019   ETH <10 05/07/2019    Metabolic Disorder Labs: Lab Results  Component Value Date   HGBA1C 5.2 02/23/2020   MPG 102.54 02/23/2020   MPG 102.54 03/27/2018   No results found for: PROLACTIN Lab Results   Component Value Date   CHOL 193 03/27/2018   TRIG 154 (H) 03/27/2018   HDL 51 03/27/2018   CHOLHDL 3.8 03/27/2018   VLDL 31 03/27/2018   LDLCALC 111 (H) 03/27/2018   LDLCALC 102 (H) 01/31/2017    Physical Findings: AIMS:  , ,  ,  ,    CIWA:    COWS:     Musculoskeletal: Strength & Muscle Tone: within normal limits Gait & Station: normal Patient leans: N/A  Psychiatric Specialty Exam: Physical Exam  Review of Systems  Blood pressure 125/80, pulse 66, temperature 98.1 F (36.7 C), temperature source Oral, resp. rate 14, height 5' 8.5" (1.74 m), weight 85 kg, SpO2 100 %.Body mass index is 28.09 kg/m.  General Appearance: Disheveled  Eye Contact:  Minimal  Speech:  Garbled  Volume:  Decreased  Mood:  Euthymic  Affect:  Flat  Thought Process:  Disorganized  Orientation:  Other:  knows name and location  Thought Content:  Illogical and Paranoid Ideation  Suicidal Thoughts:  No  Homicidal Thoughts:  No  Memory:  Immediate;   Poor Recent;   Poor Remote;   Poor  Judgement:  Impaired  Insight:  Shallow  Psychomotor Activity:  Decreased  Concentration:  Concentration: Poor and Attention Span: Poor  Recall:  Poor  Fund of Knowledge:  Poor  Language:  Poor  Akathisia:  Negative  Handed:  Right  AIMS (if indicated):     Assets:  Desire for Improvement Housing Resilience  ADL's:  Impaired  Cognition:  Impaired,  Mild  Sleep:  Number of Hours: 8.25     Treatment Plan Summary: Daily contact with patient to assess and evaluate symptoms and progress in treatment and Medication management PLAN OF CARE:Patient is a 40 year old male with history of schizophrenia presenting with psychosis, disorganization, and inability to care for self. Continue Invega 9 mg daily and Depakote 500 QHS.Patient continues to decline Invega Sustenna LAI.   Shane Sans, MD 02/28/2020, 2:27 PM

## 2020-02-28 NOTE — Progress Notes (Signed)
Recreation Therapy Notes  INPATIENT RECREATION TR PLAN  Patient Details Name: Shane Nash MRN: [id number] DOB: 07-28-1980 Today's Date: 02/28/2020  Rec Therapy Plan Is patient appropriate for Therapeutic Recreation?: Yes Treatment times per week: at least 3 Estimated Length of Stay: 5-7 days TR Treatment/Interventions: Group participation (Comment)  Discharge Criteria Pt will be discharged from therapy if:: Discharged Treatment plan/goals/alternatives discussed and agreed upon by:: Patient/family  Discharge Summary     [name] 02/28/2020, 11:32 AM

## 2020-02-28 NOTE — Plan of Care (Signed)
  Problem: Education: Goal: Emotional status will improve Outcome: Progressing Goal: Mental status will improve Outcome: Progressing   Problem: Activity: Goal: Interest or engagement in activities will improve Outcome: Progressing Goal: Sleeping patterns will improve Outcome: Progressing   Problem: Coping: Goal: Ability to demonstrate self-control will improve Outcome: Progressing   Problem: Coping: Goal: Coping ability will improve Outcome: Progressing   Problem: Health Behavior/Discharge Planning: Goal: Compliance with prescribed medication regimen will improve Outcome: Progressing   Problem: Nutritional: Goal: Ability to achieve adequate nutritional intake will improve Outcome: Progressing

## 2020-02-29 NOTE — [facility] Group Notes (Signed)
LCSW Group Therapy Note  02/29/2020 2:32 PM  Type of Therapy/Topic:  Group Therapy:  Feelings about Diagnosis  Participation Level:  Did Not Attend   Description of Group:   This group will allow patients to explore their thoughts and feelings about diagnoses they have received. Patients will be guided to explore their level of understanding and acceptance of these diagnoses. Facilitator will encourage patients to process their thoughts and feelings about the reactions of others to their diagnosis and will guide patients in identifying ways to discuss their diagnosis with significant others in their lives. This group will be process-oriented, with patients participating in exploration of their own experiences, giving and receiving support, and processing challenge from other group members.   Therapeutic Goals: 1. Patient will demonstrate understanding of diagnosis as evidenced by identifying two or more symptoms of the disorder 2. Patient will be able to express two feelings regarding the diagnosis 3. Patient will demonstrate their ability to communicate their needs through discussion and/or role play  Summary of Patient Progress: X   Therapeutic Modalities:   Cognitive Behavioral Therapy Brief Therapy Feelings Identification   Kimberlye Dilger R. Algis Greenhouse, MSW, LCSW, LCAS 02/29/2020 2:32 PM

## 2020-02-29 NOTE — Progress Notes (Signed)
[facility] MD Progress Note  02/29/2020 4:24 PM AMAAN Nash  MRN:  [id number]  CC: "I want to get my life together"  Subjective:  Patient is a 40 year old male with history of schizophrenia brought to the hospital by his sister for confusion, poor self-care, and disorganized behavior. No acute events overnight. Medication compliant. He is having difficulty performing ADLs at this time.   Patient seen one-on-one at bedside. He continues to answer some basic questions in short answers. He notes that he is tired this morning. He mentions wanting to get a house and "get life back together." In between intelligible phrases he continues to mumble in incoherent sentences. He also remains grossly disorganized on exam. He is noted to be pressing on his hand as if he is dialing a phone. He is often found standing in the hallway talking to walls as well. He continues to struggle to maintain his basic ADLs of hygiene and dressing. However, he was observed standing with cloths in the hall, and able to be prompted to come to the laundry room. At this time, patient continues to be psychotic and not at his psychiatric baseline. He continues to decline long-acting injectable. Will continue oral regimen.   Principal Problem: Disorganized schizophrenia (HCC) Diagnosis: Principal Problem:   Disorganized schizophrenia (HCC) Active Problems:   Polydipsia   Tobacco use disorder   Disorganized behavior  Total Time spent with patient: 30 minutes  Past Psychiatric History: Patient has a past history of schizophrenia. Multiple prior admissions. Often decompensates badly off of medicine.  Past Medical History:  Past Medical History:  Diagnosis Date  . Schizophrenia (HCC)    History reviewed. No pertinent surgical history. Family History: History reviewed. No pertinent family history. Family Psychiatric  History: None reported Social History:  Social History   Substance and Sexual Activity  Alcohol Use None      Social History   Substance and Sexual Activity  Drug Use No    Social History   Socioeconomic History  . Marital status: Single    Spouse name: Not on file  . Number of children: Not on file  . Years of education: Not on file  . Highest education level: Not on file  Occupational History  . Not on file  Tobacco Use  . Smoking status: Current Every Day Smoker    Packs/day: 0.25    Years: 15.00    Pack years: 3.75    Types: Cigarettes  . Smokeless tobacco: Never Used  . Tobacco comment: Cannot state the amount he smokes per day  Vaping Use  . Vaping Use: Never used  Substance and Sexual Activity  . Alcohol use: Not on file  . Drug use: No  . Sexual activity: Not Currently  Other Topics Concern  . Not on file  Social History Narrative  . Not on file   Social Determinants of Health   Financial Resource Strain: Not on file  Food Insecurity: Not on file  Transportation Needs: Not on file  Physical Activity: Not on file  Stress: Not on file  Social Connections: Not on file   Additional Social History:                         Sleep: Fair  Appetite:  Good  Current Medications: Current Facility-Administered Medications  Medication Dose Route Frequency Provider Last Rate Last Admin  . acetaminophen (TYLENOL) tablet 650 mg  650 mg Oral Q6H PRN [name], [name], MD      .  alum & mag hydroxide-simeth (MAALOX/MYLANTA) 200-200-20 MG/5ML suspension 30 mL  30 mL Oral Q4H PRN [name], [name], MD      . divalproex (DEPAKOTE ER) 24 hr tablet 500 mg  500 mg Oral QHS [name], [name], MD   500 mg at 02/28/20 2118  . magnesium hydroxide (MILK OF MAGNESIA) suspension 30 mL  30 mL Oral Daily PRN [name], [name], MD      . paliperidone (INVEGA) 24 hr tablet 9 mg  9 mg Oral Daily [name], [name], MD   9 mg at 02/29/20 2353    Lab Results: No results found for this or any previous visit (from the past 48 hour(s)).  Blood Alcohol level:  Lab Results  Component Value Date    ETH <10 05/15/2019   ETH <10 05/07/2019    Metabolic Disorder Labs: Lab Results  Component Value Date   HGBA1C 5.2 02/23/2020   MPG 102.54 02/23/2020   MPG 102.54 03/27/2018   No results found for: PROLACTIN Lab Results  Component Value Date   CHOL 193 03/27/2018   TRIG 154 (H) 03/27/2018   HDL 51 03/27/2018   CHOLHDL 3.8 03/27/2018   VLDL 31 03/27/2018   LDLCALC 111 (H) 03/27/2018   LDLCALC 102 (H) 01/31/2017    Physical Findings: AIMS:  , ,  ,  ,    CIWA:    COWS:     Musculoskeletal: Strength & Muscle Tone: within normal limits Gait & Station: normal Patient leans: N/A  Psychiatric Specialty Exam: Physical Exam   Review of Systems   Blood pressure (!) 114/92, pulse 68, temperature 98 F (36.7 C), temperature source Oral, resp. rate 18, height 5' 8.5" (1.74 m), weight 85 kg, SpO2 100 %.Body mass index is 28.09 kg/m.  General Appearance: Disheveled  Eye Contact:  Minimal  Speech:  Garbled  Volume:  Decreased  Mood:  Euthymic  Affect:  Flat  Thought Process:  Disorganized  Orientation:  Other:  knows name and location  Thought Content:  Illogical and Paranoid Ideation  Suicidal Thoughts:  No  Homicidal Thoughts:  No  Memory:  Immediate;   Poor Recent;   Poor Remote;   Poor  Judgement:  Impaired  Insight:  Shallow  Psychomotor Activity:  Decreased  Concentration:  Concentration: Poor and Attention Span: Poor  Recall:  Poor  Fund of Knowledge:  Poor  Language:  Poor  Akathisia:  Negative  Handed:  Right  AIMS (if indicated):     Assets:  Desire for Improvement Housing Resilience  ADL's:  Impaired  Cognition:  Impaired,  Mild  Sleep:  Number of Hours: 9     Treatment Plan Summary: Daily contact with patient to assess and evaluate symptoms and progress in treatment and Medication management PLAN OF CARE:Patient is a 39 year old male with history of schizophrenia presenting with psychosis, disorganization, and inability to care for self. Continue  Invega 9 mg daily and Depakote 500 QHS.Patient continues to decline Invega Sustenna LAI.   [name], MD 02/29/2020, 4:24 PM

## 2020-02-29 NOTE — Progress Notes (Signed)
Recreation Therapy Notes   Date: 02/29/2020  Time: 9:30 am   Location: Craft room     Behavioral response: N/A   Intervention Topic: Self-care   Discussion/Intervention: Patient did not attend group.   Clinical Observations/Feedback:  Patient did not attend group.   Dmari Schubring LRT/CTRS        Shandee Jergens 02/29/2020 11:49 AM

## 2020-02-29 NOTE — Progress Notes (Signed)
Pt is disorganized, childlike and cautious. Pt is responding. Pt has been calm and cooperative. Torrie Mayers RN

## 2020-02-29 NOTE — [facility] Group Notes (Signed)
[facility] Group Notes:  (Nursing/MHT/Case Management/Adjunct)  Date:  02/29/2020  Time:  12:43 AM  Type of Therapy:  Group Therapy  Participation Level:  Did Not Attend   Shane Nash 02/29/2020, 12:43 AM

## 2020-02-29 NOTE — Plan of Care (Signed)
Pt denies depression, anxiety, SI, HI and AVH. Pt was educated on care plan and verbalizes understanding. Shalice Woodring RN  Problem: Education: Goal: Knowledge of [facility] [facility] Education information/materials will improve Outcome: Progressing Goal: Emotional status will improve Outcome: Progressing Goal: Mental status will improve Outcome: Progressing Goal: Verbalization of understanding the information provided will improve Outcome: Progressing   Problem: Activity: Goal: Interest or engagement in activities will improve Outcome: Progressing Goal: Sleeping patterns will improve Outcome: Progressing   Problem: Coping: Goal: Ability to verbalize frustrations and anger appropriately will improve Outcome: Progressing Goal: Ability to demonstrate self-control will improve Outcome: Progressing   Problem: Health Behavior/Discharge Planning: Goal: Identification of resources available to assist in meeting health care needs will improve Outcome: Progressing Goal: Compliance with treatment plan for underlying cause of condition will improve Outcome: Progressing   Problem: Physical Regulation: Goal: Ability to maintain clinical measurements within normal limits will improve Outcome: Progressing   Problem: Safety: Goal: Periods of time without injury will increase Outcome: Progressing   Problem: Activity: Goal: Will verbalize the importance of balancing activity with adequate rest periods Outcome: Progressing   Problem: Education: Goal: Will be free of psychotic symptoms Outcome: Progressing Goal: Knowledge of the prescribed therapeutic regimen will improve Outcome: Progressing   Problem: Coping: Goal: Coping ability will improve Outcome: Progressing Goal: Will verbalize feelings Outcome: Progressing   Problem: Health Behavior/Discharge Planning: Goal: Compliance with prescribed medication regimen will improve Outcome: Progressing   Problem: Nutritional: Goal:  Ability to achieve adequate nutritional intake will improve Outcome: Progressing   Problem: Role Relationship: Goal: Ability to communicate needs accurately will improve Outcome: Progressing Goal: Ability to interact with others will improve Outcome: Progressing   Problem: Safety: Goal: Ability to redirect hostility and anger into socially appropriate behaviors will improve Outcome: Progressing Goal: Ability to remain free from injury will improve Outcome: Progressing   Problem: Self-Care: Goal: Ability to participate in self-care as condition permits will improve Outcome: Progressing   Problem: Self-Concept: Goal: Will verbalize positive feelings about self Outcome: Progressing   

## 2020-03-01 DIAGNOSIS — F201 Disorganized schizophrenia: Secondary | ICD-10-CM | POA: Diagnosis not present

## 2020-03-01 MED ORDER — PALIPERIDONE ER 9 MG PO TB24: 9.0000 mg | ORAL_TABLET | Freq: Every day | ORAL | 1 refills | Status: DC

## 2020-03-01 MED ORDER — DIVALPROEX SODIUM ER 500 MG PO TB24: 500.0000 mg | ORAL_TABLET | Freq: Every day | ORAL | 1 refills | Status: DC

## 2020-03-01 NOTE — [facility] Group Notes (Signed)
LCSW Group Therapy Note  03/01/2020 2:29 PM  Type of Therapy/Topic:  Group Therapy:  Emotion Regulation  Participation Level:  Did Not Attend   Description of Group:   The purpose of this group is to assist patients in learning to regulate negative emotions and experience positive emotions. Patients will be guided to discuss ways in which they have been vulnerable to their negative emotions. These vulnerabilities will be juxtaposed with experiences of positive emotions or situations, and patients will be challenged to use positive emotions to combat negative ones. Special emphasis will be placed on coping with negative emotions in conflict situations, and patients will process healthy conflict resolution skills.  Therapeutic Goals: 1. Patient will identify two positive emotions or experiences to reflect on in order to balance out negative emotions 2. Patient will label two or more emotions that they find the most difficult to experience 3. Patient will demonstrate positive conflict resolution skills through discussion and/or role plays  Summary of Patient Progress: X  Therapeutic Modalities:   Cognitive Behavioral Therapy Feelings Identification Dialectical Behavioral Therapy  Penni Homans, MSW, LCSW 03/01/2020 2:29 PM

## 2020-03-01 NOTE — Plan of Care (Signed)
Pt denies depression, anxiety, SI, HI and AVH. Pt was educated on care plan and verbalizes understanding. Torrie Mayers RN Problem: Education: Goal: Knowledge of [facility] Education information/materials will improve Outcome: Progressing Goal: Emotional status will improve Outcome: Progressing Goal: Mental status will improve Outcome: Progressing Goal: Verbalization of understanding the information provided will improve Outcome: Progressing   Problem: Activity: Goal: Interest or engagement in activities will improve Outcome: Progressing Goal: Sleeping patterns will improve Outcome: Progressing   Problem: Coping: Goal: Ability to verbalize frustrations and anger appropriately will improve Outcome: Progressing Goal: Ability to demonstrate self-control will improve Outcome: Progressing   Problem: Health Behavior/Discharge Planning: Goal: Identification of resources available to assist in meeting health care needs will improve Outcome: Progressing Goal: Compliance with treatment plan for underlying cause of condition will improve Outcome: Progressing   Problem: Physical Regulation: Goal: Ability to maintain clinical measurements within normal limits will improve Outcome: Progressing   Problem: Safety: Goal: Periods of time without injury will increase Outcome: Progressing   Problem: Activity: Goal: Will verbalize the importance of balancing activity with adequate rest periods Outcome: Progressing   Problem: Education: Goal: Will be free of psychotic symptoms Outcome: Progressing Goal: Knowledge of the prescribed therapeutic regimen will improve Outcome: Progressing   Problem: Coping: Goal: Coping ability will improve Outcome: Progressing Goal: Will verbalize feelings Outcome: Progressing   Problem: Health Behavior/Discharge Planning: Goal: Compliance with prescribed medication regimen will improve Outcome: Progressing   Problem: Nutritional: Goal:  Ability to achieve adequate nutritional intake will improve Outcome: Progressing   Problem: Role Relationship: Goal: Ability to communicate needs accurately will improve Outcome: Progressing Goal: Ability to interact with others will improve Outcome: Progressing   Problem: Safety: Goal: Ability to redirect hostility and anger into socially appropriate behaviors will improve Outcome: Progressing Goal: Ability to remain free from injury will improve Outcome: Progressing   Problem: Self-Care: Goal: Ability to participate in self-care as condition permits will improve Outcome: Progressing   Problem: Self-Concept: Goal: Will verbalize positive feelings about self Outcome: Progressing

## 2020-03-01 NOTE — Progress Notes (Signed)
Pt remains, psychotic, disorganized, and childlike. Pt is responding to stimuli and still has bizzare behavior. Pt has been calm and cooperative. He is currently in bed resting quietly at this time.

## 2020-03-01 NOTE — Progress Notes (Signed)
Recreation Therapy Notes   Date: 03/01/2020  Time: 9:30 am   Location: Craft room     Behavioral response: N/A   Intervention Topic: Goals   Discussion/Intervention: Patient did not attend group.   Clinical Observations/Feedback:  Patient did not attend group.   Anush Wiedeman LRT/CTRS        Khristen Cheyney 03/01/2020 1:14 PM

## 2020-03-01 NOTE — Progress Notes (Signed)
[facility] MD Progress Note  03/01/2020 10:12 AM Shane Nash  MRN:  [id number]  CC: "I feel better"  Subjective:  Patient is a 40 year old male with history of schizophrenia brought to the hospital by his sister for confusion, poor self-care, and disorganized behavior. No acute events overnight. Medication compliant. Performing ADLs.   Patient seen one-on-one at bedside. He continues to answer  basic questions in short answers. He notes that he feels better today. Says "yes" when asked if he had breakfast, and slept overnight. He says "no" when asked if he is having homicidal ideations, visual hallucinations, suicidal ideations, or auditory hallucinations. He mumbles less in between answers than on admissions. He continues to be seen responding to internal stimuli when on his own, but able to confirm this is his psychiatric baseline with family and staff that has worked with him before. He has had no more extreme confusion such as eating feces seen prior to admission. He is also better attending to his ADLs this morning without assistance. He declines Invega LAI again today.   Contacted his sister, Shane Nash, [phone] today. She states she will be able to have Mayur return to her home tomorrow. She requests that I send his prescriptions to [facility] on [address] so she can have these filled and ready for his arrival. Reviewed current medications with her, and provided update on patient's current behaviors on unit.     Principal Problem: Disorganized schizophrenia (HCC) Diagnosis: Principal Problem:   Disorganized schizophrenia (HCC) Active Problems:   Polydipsia   Tobacco use disorder   Disorganized behavior  Total Time spent with patient: 30 minutes  Past Psychiatric History: Patient has a past history of schizophrenia. Multiple prior admissions. Often decompensates badly off of medicine.  Past Medical History:  Past Medical History:  Diagnosis Date  . Schizophrenia  (HCC)    History reviewed. No pertinent surgical history. Family History: History reviewed. No pertinent family history. Family Psychiatric  History: None reported Social History:  Social History   Substance and Sexual Activity  Alcohol Use None     Social History   Substance and Sexual Activity  Drug Use No    Social History   Socioeconomic History  . Marital status: Single    Spouse name: Not on file  . Number of children: Not on file  . Years of education: Not on file  . Highest education level: Not on file  Occupational History  . Not on file  Tobacco Use  . Smoking status: Current Every Day Smoker    Packs/day: 0.25    Years: 15.00    Pack years: 3.75    Types: Cigarettes  . Smokeless tobacco: Never Used  . Tobacco comment: Cannot state the amount he smokes per day  Vaping Use  . Vaping Use: Never used  Substance and Sexual Activity  . Alcohol use: Not on file  . Drug use: No  . Sexual activity: Not Currently  Other Topics Concern  . Not on file  Social History Narrative  . Not on file   Social Determinants of Health   Financial Resource Strain: Not on file  Food Insecurity: Not on file  Transportation Needs: Not on file  Physical Activity: Not on file  Stress: Not on file  Social Connections: Not on file   Additional Social History:                         Sleep: Fair  Appetite:  Good  Current Medications: Current Facility-Administered Medications  Medication Dose Route Frequency Provider Last Rate Last Admin  . acetaminophen (TYLENOL) tablet 650 mg  650 mg Oral Q6H PRN [name], [name], MD      . alum & mag hydroxide-simeth (MAALOX/MYLANTA) 200-200-20 MG/5ML suspension 30 mL  30 mL Oral Q4H PRN [name], [name], MD      . divalproex (DEPAKOTE ER) 24 hr tablet 500 mg  500 mg Oral QHS [name], [name], MD   500 mg at 02/29/20 2108  . magnesium hydroxide (MILK OF MAGNESIA) suspension 30 mL  30 mL Oral Daily PRN [name], [name], MD      .  paliperidone (INVEGA) 24 hr tablet 9 mg  9 mg Oral Daily [name], [name], MD   9 mg at 03/01/20 2542    Lab Results: No results found for this or any previous visit (from the past 48 hour(s)).  Blood Alcohol level:  Lab Results  Component Value Date   ETH <10 05/15/2019   ETH <10 05/07/2019    Metabolic Disorder Labs: Lab Results  Component Value Date   HGBA1C 5.2 02/23/2020   MPG 102.54 02/23/2020   MPG 102.54 03/27/2018   No results found for: PROLACTIN Lab Results  Component Value Date   CHOL 193 03/27/2018   TRIG 154 (H) 03/27/2018   HDL 51 03/27/2018   CHOLHDL 3.8 03/27/2018   VLDL 31 03/27/2018   LDLCALC 111 (H) 03/27/2018   LDLCALC 102 (H) 01/31/2017    Physical Findings: AIMS:  , ,  ,  ,    CIWA:    COWS:     Musculoskeletal: Strength & Muscle Tone: within normal limits Gait & Station: normal Patient leans: N/A  Psychiatric Specialty Exam: Physical Exam Vitals and nursing note reviewed.  Constitutional:      Appearance: Normal appearance.  HENT:     Head: Normocephalic and atraumatic.     Right Ear: External ear normal.     Left Ear: External ear normal.     Nose: Nose normal.     Mouth/Throat:     Mouth: Mucous membranes are moist.     Pharynx: Oropharynx is clear.  Eyes:     Extraocular Movements: Extraocular movements intact.     Conjunctiva/sclera: Conjunctivae normal.     Pupils: Pupils are equal, round, and reactive to light.  Cardiovascular:     Rate and Rhythm: Normal rate.     Pulses: Normal pulses.  Pulmonary:     Effort: Pulmonary effort is normal.     Breath sounds: Normal breath sounds.  Abdominal:     General: Abdomen is flat.     Palpations: Abdomen is soft.  Musculoskeletal:        General: No swelling. Normal range of motion.     Cervical back: Normal range of motion and neck supple.  Skin:    General: Skin is warm and dry.  Neurological:     General: No focal deficit present.     Mental Status: He is alert and  oriented to person, place, and time.  Psychiatric:        Attention and Perception: He is inattentive. He perceives auditory hallucinations.        Mood and Affect: Mood normal. Affect is flat.        Speech: Speech normal.        Behavior: Behavior is cooperative.        Thought Content: Thought content does not include  homicidal or suicidal ideation.        Cognition and Memory: Cognition and memory normal.        Judgment: Judgment normal.     Review of Systems  Constitutional: Negative for appetite change and fatigue.  HENT: Negative for rhinorrhea and sore throat.   Eyes: Negative for photophobia.  Respiratory: Negative for cough and shortness of breath.   Cardiovascular: Negative for chest pain and palpitations.  Gastrointestinal: Negative for constipation, diarrhea, nausea and vomiting.  Endocrine: Negative for cold intolerance and heat intolerance.  Genitourinary: Negative for difficulty urinating and dysuria.  Musculoskeletal: Negative for arthralgias and myalgias.  Skin: Negative for rash and wound.  Allergic/Immunologic: Negative for environmental allergies and food allergies.  Neurological: Negative for dizziness and headaches.  Hematological: Negative for adenopathy. Does not bruise/bleed easily.  Psychiatric/Behavioral: Positive for hallucinations. Negative for agitation, behavioral problems and suicidal ideas.    Blood pressure 102/74, pulse 67, temperature 98 F (36.7 C), temperature source Oral, resp. rate 17, height 5' 8.5" (1.74 m), weight 85 kg, SpO2 100 %.Body mass index is 28.09 kg/m.  General Appearance: Casual  Eye Contact:  Fair  Speech:  Garbled  Volume:  Normal  Mood:  Euthymic  Affect:  Flat  Thought Process:  Disorganized  Orientation:  Full (Time, Place, and Person)  Thought Content:  Patient denies auditory and visual hallucinations, but is observed responing to internal stimuli  Suicidal Thoughts:  No  Homicidal Thoughts:  No  Memory:   Immediate;   Fair Recent;   Fair Remote;   Poor  Judgement:  Intact  Insight:  Shallow  Psychomotor Activity:  Normal  Concentration:  Concentration: Fair and Attention Span: Fair  Recall:  Poor  Fund of Knowledge:  Poor  Language:  Poor  Akathisia:  Negative  Handed:  Right  AIMS (if indicated):     Assets:  Desire for Improvement Housing Resilience  ADL's:  Impaired  Cognition:  Impaired,  Mild  Sleep:  Number of Hours: 7     Treatment Plan Summary: Daily contact with patient to assess and evaluate symptoms and progress in treatment and Medication management PLAN OF CARE:Patient is a 40 year old male with history of schizophrenia who presented with psychosis, disorganization, and inability to care for self. Continue Invega 9 mg daily and Depakote 500 QHS.Patient continues to decline Invega Sustenna LAI. Anticipate discharge tomorrow to live with his sister.   [name], MD 03/01/2020, 10:12 AM

## 2020-03-01 NOTE — Tx Team (Signed)
Interdisciplinary Treatment and Diagnostic Plan Update  03/01/2020 Time of Session: 8:30AM BRIDGER PIZZI MRN: [id number]  Principal Diagnosis: Disorganized schizophrenia [facility])  Secondary Diagnoses: Principal Problem:   Disorganized schizophrenia (HCC) Active Problems:   Polydipsia   Tobacco use disorder   Disorganized behavior   Current Medications:  Current Facility-Administered Medications  Medication Dose Route Frequency Provider Last Rate Last Admin  . acetaminophen (TYLENOL) tablet 650 mg  650 mg Oral Q6H PRN [name], [name], MD      . alum & mag hydroxide-simeth (MAALOX/MYLANTA) 200-200-20 MG/5ML suspension 30 mL  30 mL Oral Q4H PRN [name], [name], MD      . divalproex (DEPAKOTE ER) 24 hr tablet 500 mg  500 mg Oral QHS [name], [name], MD   500 mg at 02/29/20 2108  . magnesium hydroxide (MILK OF MAGNESIA) suspension 30 mL  30 mL Oral Daily PRN [name], [name], MD      . paliperidone (INVEGA) 24 hr tablet 9 mg  9 mg Oral Daily [name], [name], MD   9 mg at 03/01/20 8921   PTA Medications: Medications Prior to Admission  Medication Sig Dispense Refill Last Dose  . benztropine (COGENTIN) 1 MG tablet Take 1 tablet (1 mg total) by mouth 2 (two) times daily. 60 tablet 0   . divalproex (DEPAKOTE ER) 500 MG 24 hr tablet Take 1 tablet (500 mg total) by mouth at bedtime. 30 tablet 0   . hydrOXYzine (ATARAX/VISTARIL) 50 MG tablet Take 1 tablet (50 mg total) by mouth every 6 (six) hours as needed for anxiety. 30 tablet 0   . INVEGA SUSTENNA 234 MG/1.5ML SUSY injection Inject 234 mg into the muscle every 30 (thirty) days. 1.8 mL 0   . traZODone (DESYREL) 50 MG tablet Take 1 tablet (50 mg total) by mouth at bedtime as needed for sleep. 30 tablet 0     Patient Stressors: Medication change or noncompliance  Patient Strengths: Physical Health Supportive family/friends  Treatment Modalities: Medication Management, Group therapy, Case management,  1 to 1 session with clinician,  Psychoeducation, Recreational therapy.   Physician Treatment Plan for Primary Diagnosis: Disorganized schizophrenia (HCC) Long Term Goal(s): Improvement in symptoms so as ready for discharge Improvement in symptoms so as ready for discharge   Short Term Goals: Ability to identify changes in lifestyle to reduce recurrence of condition will improve Ability to verbalize feelings will improve Ability to disclose and discuss suicidal ideas Ability to demonstrate self-control will improve Ability to identify and develop effective coping behaviors will improve Ability to maintain clinical measurements within normal limits will improve Compliance with prescribed medications will improve Ability to identify changes in lifestyle to reduce recurrence of condition will improve Ability to verbalize feelings will improve Ability to disclose and discuss suicidal ideas Ability to demonstrate self-control will improve Ability to identify and develop effective coping behaviors will improve Ability to maintain clinical measurements within normal limits will improve Compliance with prescribed medications will improve  Medication Management: Evaluate patient's response, side effects, and tolerance of medication regimen.  Therapeutic Interventions: 1 to 1 sessions, Unit Group sessions and Medication administration.  Evaluation of Outcomes: Progressing  Physician Treatment Plan for Secondary Diagnosis: Principal Problem:   Disorganized schizophrenia (HCC) Active Problems:   Polydipsia   Tobacco use disorder   Disorganized behavior  Long Term Goal(s): Improvement in symptoms so as ready for discharge Improvement in symptoms so as ready for discharge   Short Term Goals: Ability to identify changes in lifestyle  to reduce recurrence of condition will improve Ability to verbalize feelings will improve Ability to disclose and discuss suicidal ideas Ability to demonstrate self-control will  improve Ability to identify and develop effective coping behaviors will improve Ability to maintain clinical measurements within normal limits will improve Compliance with prescribed medications will improve Ability to identify changes in lifestyle to reduce recurrence of condition will improve Ability to verbalize feelings will improve Ability to disclose and discuss suicidal ideas Ability to demonstrate self-control will improve Ability to identify and develop effective coping behaviors will improve Ability to maintain clinical measurements within normal limits will improve Compliance with prescribed medications will improve     Medication Management: Evaluate patient's response, side effects, and tolerance of medication regimen.  Therapeutic Interventions: 1 to 1 sessions, Unit Group sessions and Medication administration.  Evaluation of Outcomes: Progressing   RN Treatment Plan for Primary Diagnosis: Disorganized schizophrenia (HCC) Long Term Goal(s): Knowledge of disease and therapeutic regimen to maintain health will improve  Short Term Goals: Ability to remain free from injury will improve, Ability to verbalize frustration and anger appropriately will improve, Ability to demonstrate self-control, Ability to disclose and discuss suicidal ideas, Ability to identify and develop effective coping behaviors will improve and Compliance with prescribed medications will improve  Medication Management: RN will administer medications as ordered by provider, will assess and evaluate patient's response and provide education to patient for prescribed medication. RN will report any adverse and/or side effects to prescribing provider.  Therapeutic Interventions: 1 on 1 counseling sessions, Psychoeducation, Medication administration, Evaluate responses to treatment, Monitor vital signs and CBGs as ordered, Perform/monitor CIWA, COWS, AIMS and Fall Risk screenings as ordered, Perform wound care  treatments as ordered.  Evaluation of Outcomes: Progressing   LCSW Treatment Plan for Primary Diagnosis: Disorganized schizophrenia (HCC) Long Term Goal(s): Safe transition to appropriate next level of care at discharge, Engage patient in therapeutic group addressing interpersonal concerns.  Short Term Goals: Engage patient in aftercare planning with referrals and resources, Increase social support, Increase ability to appropriately verbalize feelings, Increase emotional regulation, Facilitate acceptance of mental health diagnosis and concerns, Identify triggers associated with mental health/substance abuse issues and Increase skills for wellness and recovery  Therapeutic Interventions: Assess for all discharge needs, 1 to 1 time with Social worker, Explore available resources and support systems, Assess for adequacy in community support network, Educate family and significant other(s) on suicide prevention, Complete Psychosocial Assessment, Interpersonal group therapy.  Evaluation of Outcomes: Progressing   Progress in Treatment: Attending groups: No. Participating in groups: No. Taking medication as prescribed: Yes. Toleration medication: Yes. Family/Significant other contact made: Yes, individual(s) contacted:  guardian/sister, Henriette Combs. Patient understands diagnosis: No. Discussing patient identified problems/goals with staff: No. Medical problems stabilized or resolved: Yes. Denies suicidal/homicidal ideation: Yes. Issues/concerns per patient self-inventory: No. Other: None.  New problem(s) identified: No, Describe:  none.  New Short Term/Long Term Goal(s): elimination of symptoms of psychosis, medication management for mood stabilization; elimination of SI thoughts; development of comprehensive mental wellness/sobriety plan. Update 03/01/20: No changes at this time.  Patient Goals: Patient was unable to attend treatment team meeting. Noted to be disorganized. Patient will  continue to be assessed. Update 03/01/20: No changes at this time.   Discharge Plan or Barriers: Patient plans to return to his group home upon discharge. Update 03/01/20: CSW will assist pt with discharge/aftercare plan. Per guardian, pt is not discharging to a group home but will remain in a family member's home.  Reason for Continuation of Hospitalization: Medication stabilization  Estimated Length of Stay: 1-7 days  Recreational Therapy: Patient Stressors: N/A Patient Goal: Patient will engage in groups without prompting or encouragement from LRT x3 group sessions within 5 recreation therapy group sessions.  Attendees: Patient:  03/01/2020 9:32 AM  Physician: Les Pou, MD  03/01/2020 9:32 AM  Nursing:  03/01/2020 9:32 AM  RN Care Manager: 03/01/2020 9:32 AM  Social Worker: Vilma Meckel. Algis Greenhouse, MSW, [place], LCAS 03/01/2020 9:32 AM  Recreational Therapist:  03/01/2020 9:32 AM  Other: Penni Homans, MSW, LCSW 03/01/2020 9:32 AM  Other:  03/01/2020 9:32 AM  Other: 03/01/2020 9:32 AM    Scribe for Treatment Team: Glenis Smoker, LCSW 03/01/2020 9:32 AM

## 2020-03-01 NOTE — Progress Notes (Signed)
Pt seems to have some improvement today. He has been calm, cooperative and med compliant. Pt is still disorganized and has tangential speech.  Pt has a new order in for double portions.  Torrie Mayers RN

## 2020-03-02 DIAGNOSIS — F201 Disorganized schizophrenia: Secondary | ICD-10-CM | POA: Diagnosis not present

## 2020-03-02 NOTE — BHH Counselor (Signed)
CSW attempted to schedule patient for aftercare with reported provider [facility] at pt's sister request.    Once CSW contacted [facility] Chick was informed that the patient was not active with this agency due lack of response.  Gwynneth Aliment, [phone], reports that two letters were sent in October 2021 and pt nor family responded, as a result the patient was discharged from [facility].    CSW informed the pt's guardian, Khyre Germond, sister/guardian, [phone], who denied this stating that she did not receive any correspondence from [facility].    CSW asked if sister would like for referrals for an ACTT team to be made and sister/guardian agreed.  She provided verbal permission.  Penni Homans, MSW, LCSW 03/02/2020 1:38 PM

## 2020-03-02 NOTE — Progress Notes (Signed)
Discharge Note:  D. Pt alert and oriented. Denies SI and HI at present. Compliant with medications. Pt assessed by MD. Discharged  home as ordered.     A.  Emotional Support offered Patient and encouragement provided. All belongings from locker returned to Patient at t  the time of discharge. Discharge instructions reveiwed with Patient and Uncle Thomson Herbers.  R.  Family  verbalized understanding related to discharge instructions. Pt signed belonging sheet in agreement with  items received from locker. Denies concerns at this time ambulatory with steady gait. Appears to be in no physical  distress Patient was picked up by his Stefani Dama at the medical mall entrance.

## 2020-03-02 NOTE — Progress Notes (Signed)
Patient alert and oriented x 2 with periods of confusion to place and situation, he denies SI/HI/AVH, he isolated  most of the shift to his room  his thoughts are disorganized, speech is tangential, and incoherent at times, he was noted restless and impulsive . Patient was noted in the dinning room putting excessive sugar in his coffee drink when approached he wasn't receptive to staff..Patient appears responding to internal stimuli, 15 minutes safety checks maintained will continue to monitor.

## 2020-03-02 NOTE — Progress Notes (Signed)
Recreation Therapy Notes  INPATIENT RECREATION TR PLAN  Patient Details Name: Shane Nash MRN: [id number] DOB: 1980/07/23 Today's Date: 03/02/2020  Rec Therapy Plan Is patient appropriate for Therapeutic Recreation?: Yes Treatment times per week: at least 3 Estimated Length of Stay: 5-7 days TR Treatment/Interventions: Group participation (Comment)  Discharge Criteria Pt will be discharged from therapy if:: Discharged Treatment plan/goals/alternatives discussed and agreed upon by:: Patient/family  Discharge Summary Short term goals set: Patient will engage in groups without prompting or encouragement from LRT x3 group sessions within 5 recreation therapy group sessions Short term goals met: Not met Reason goals not met: Patient did not attends any groups Therapeutic equipment acquired: N/A Reason patient discharged from therapy: Discharge from hospital Pt/family agrees with progress & goals achieved: Yes Date patient discharged from therapy: 03/02/20   [name] 03/02/2020, 1:20 PM

## 2020-03-02 NOTE — [facility] Counselor (Signed)
CSW team faxed referral packets to [facility], [facility], and [facility].  Vilma Meckel. Algis Greenhouse, MSW, LCSW, LCAS 03/02/2020 1:20 PM

## 2020-03-02 NOTE — Plan of Care (Signed)
Patient care plans adequate for discharge.

## 2020-03-02 NOTE — Progress Notes (Signed)
Recreation Therapy Notes  Date: 03/02/2020  Time: 9:30 am   Location: Craft room     Behavioral response: N/A   Intervention Topic: Social Skills   Discussion/Intervention: Patient did not attend group.   Clinical Observations/Feedback:  Patient did not attend group.   Ambrea Hegler LRT/CTRS        Jerre Diguglielmo 03/02/2020 12:10 PM

## 2020-03-02 NOTE — Progress Notes (Signed)
  [facility] Adult Case Management Discharge Plan :  Will you be returning to the same living situation after discharge:  Yes,  pt guardian reports that pt is returning home. At discharge, do you have transportation home?: Yes,  pt's uncle will provide transportation.  Do you have the ability to pay for your medications: Yes,  [facility].  Release of information consent forms completed and in the chart;  Patient's signature needed at discharge.  Patient to Follow up at:  Follow-up Information    [address] [facility] [place] [employer]. Follow up.   Why: Referrals were sent.  They should follow up with you, however, if they do not please follow up with them.  Thanks! Contact information: [address] [place] [address] [phone]        [facility] Follow up.   Why: Referrals were sent.  They should follow up with you, however, if they do not please follow up with them.  Thanks! Contact information: [address] [place] [address] [phone]        [facility] Follow up.   Why: Referrals were sent.  They should follow up with you, however, if they do not please follow up with them.  Thanks! Contact information: [address] [place] [address] [phone]               Next level of care provider has access to [facility] Link:no  Safety Planning and Suicide Prevention discussed: Yes,  SPE completed with patient and pt's guardian.      Has patient been referred to the Quitline?: Patient refused referral  Patient has been referred for addiction treatment: Pt. refused referral  [name], LCSW 03/02/2020, 10:30 AM

## 2020-03-02 NOTE — [facility] Suicide Risk Assessment (Signed)
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  HENT: Negative for rhinorrhea and sore throat.   Eyes: Negative for photophobia and visual disturbance.  Respiratory: Negative for cough and shortness of breath.   Cardiovascular: Negative for chest pain and palpitations.  Gastrointestinal: Negative for constipation, diarrhea, nausea and vomiting.  Endocrine: Negative for cold intolerance and heat intolerance.  Genitourinary: Negative for difficulty urinating and dysuria.  Musculoskeletal: Negative for arthralgias and myalgias.  Skin: Negative for rash and wound.  Allergic/Immunologic: Negative for environmental allergies and food allergies.  Neurological: Negative for dizziness and headaches.  Hematological: Negative for adenopathy. Does not bruise/bleed easily.  Psychiatric/Behavioral: Negative for behavioral problems, hallucinations and suicidal ideas.    Blood pressure 118/86, pulse 66, temperature 98.6 F (37 C), temperature source Oral, resp. rate 18, height 5' 8.5" (1.74 m), weight 85 kg, SpO2 100 %.Body mass index is 28.09 kg/m.  General Appearance: Fairly Groomed  Patent attorney::  Good  Speech:  [phone]  Volume:  Normal  Mood:  Euthymic  Affect:  Congruent  Thought Process:  Coherent  Orientation:  Full (Time, Place, and Person)  Thought Content:  Tangential  Suicidal Thoughts:  No  Homicidal Thoughts:  No  Memory:  Immediate;   Fair Recent;   Fair Remote;   Poor  Judgement:  Intact  Insight:  Shallow  Psychomotor Activity:  Normal   Concentration:  Fair  Recall:  Poor  Fund of Knowledge:Poor  Language: Poor  Akathisia:  Negative  Handed:  Right  AIMS (if indicated):     Assets:  Desire for Improvement Financial Resources/Insurance Housing Leisure Time Physical Health Resilience Social Support  Sleep:  Number of Hours: 2  Cognition: WNL  ADL's:  Intact   Mental Status Per Nursing Assessment::   On Admission:  NA  Demographic Factors:  Male  Loss Factors: NA  Historical Factors: NA  Risk Reduction Factors:   Sense of responsibility to family, Religious beliefs about death, Living with another person, especially a relative, Positive social support, Positive therapeutic relationship and Positive coping skills or problem solving skills  Continued Clinical Symptoms:  Schizophrenia:   Paranoid or undifferentiated type Previous Psychiatric Diagnoses and Treatments  Cognitive Features That Contribute To Risk:  None    Suicide Risk:  Minimal: No identifiable suicidal ideation.  Patients presenting with no risk factors but with morbid ruminations; may be classified as minimal risk based on the severity of the depressive symptoms    Plan Of Care/Follow-up recommendations:  Activity:  as tolerated Diet:  regular diet  [name], MD 03/02/2020, 10:23 AM

## 2020-03-02 NOTE — Discharge Summary (Signed)
Physician Discharge Summary Note  Patient:  Shane Nash is an 40 y.o., male MRN:  [id number] DOB:  03/11/1980 Patient phone:  [phone] (home)  Patient address:   [address] [place] [address],  Total Time spent with patient: 45 minutes- 25 minutes spent in direct patient care, 20 minutes preparation of discharge records and prescriptions   Date of Admission:  02/24/2020 Date of Discharge: 03/02/2020  Reason for Admission:  Patient is a 40 year old male with history of schizophrenia brought to the hospital by his sister for confusion, poor self-care, and disorganized behavior.   Principal Problem: Disorganized schizophrenia [facility]) Discharge Diagnoses: Principal Problem:   Disorganized schizophrenia (HCC) Active Problems:   Polydipsia   Tobacco use disorder   Past Psychiatric History: Patient has a past history of schizophrenia. Multiple prior admissions. Often decompensates badly off of medicine.  Past Medical History:  Past Medical History:  Diagnosis Date  . Schizophrenia (HCC)    History reviewed. No pertinent surgical history. Family History: History reviewed. No pertinent family history. Family Psychiatric  History: No reported mental health history, substance abuse history, or history of suicide attempts Social History:  Social History   Substance and Sexual Activity  Alcohol Use None     Social History   Substance and Sexual Activity  Drug Use No    Social History   Socioeconomic History  . Marital status: Single    Spouse name: Not on file  . Number of children: Not on file  . Years of education: Not on file  . Highest education level: Not on file  Occupational History  . Not on file  Tobacco Use  . Smoking status: Current Every Day Smoker    Packs/day: 0.25    Years: 15.00    Pack years: 3.75    Types: Cigarettes  . Smokeless tobacco: Never Used  . Tobacco comment: Cannot state the amount he smokes per day  Vaping Use   . Vaping Use: Never used  Substance and Sexual Activity  . Alcohol use: Not on file  . Drug use: No  . Sexual activity: Not Currently  Other Topics Concern  . Not on file  Social History Narrative  . Not on file   Social Determinants of Health   Financial Resource Strain: Not on file  Food Insecurity: Not on file  Transportation Needs: Not on file  Physical Activity: Not on file  Stress: Not on file  Social Connections: Not on file    Hospital Course:  Patient is a 40 year old male with history of schizophrenia brought to the hospital by his sister for confusion, poor self-care, and disorganized behavior. While in the emergency room he was found to be hyponatremic due to excessive water intake. He diuresed and sodium levels stabilized enough for admission to our BMU where home Invega 9 mg daily and Depakote 500 mg QHS resumed. On admission he remained highly confused with mostly unintelligible garbled speech. He was also found having difficulties performing ADLs with toothpaste smeared across his hands and face. Each day, Silvia slowly improved back to his baseline. He is able to answer questions logically in short sentences, and is oriented to time, place, and situation once more. He continues to be seen responding to internal stimuli when on his own, but no longer displays any grossly disorganized behavior. He was medication compliant on the unit, and ADLs improved. He requested to wash his clothes, and was able to eat appropriately, and brush teeth appropriately. His  sister was contacted prior to discharge to confirm this is patient's baseline behavior. She felt safe with him returning home to live with her. Prescriptions sent to [facility] on [address] per her request. At time of discharge patient denied suicidal ideations, homicidal ideations, visual hallucinations, and auditory hallucinations.   Physical Findings: AIMS: Facial and Oral Movements Muscles of Facial Expression:  None, normal Lips and Perioral Area: None, normal Jaw: None, normal Tongue: None, normal,Extremity Movements Upper (arms, wrists, hands, fingers): None, normal Lower (legs, knees, ankles, toes): None, normal, Trunk Movements Neck, shoulders, hips: None, normal, Overall Severity Severity of abnormal movements (highest score from questions above): None, normal Incapacitation due to abnormal movements: None, normal Patient's awareness of abnormal movements (rate only patient's report): No Awareness, Dental Status Current problems with teeth and/or dentures?: No Does patient usually wear dentures?: No  CIWA:    COWS:     Musculoskeletal: Strength & Muscle Tone: within normal limits Gait & Station: normal Patient leans: N/A  Psychiatric Specialty Exam: Physical Exam Vitals and nursing note reviewed.  Constitutional:      Appearance: Normal appearance.  HENT:     Head: Normocephalic and atraumatic.     Right Ear: External ear normal.     Left Ear: External ear normal.     Nose: Nose normal.     Mouth/Throat:     Mouth: Mucous membranes are moist.     Pharynx: Oropharynx is clear.  Eyes:     Extraocular Movements: Extraocular movements intact.     Conjunctiva/sclera: Conjunctivae normal.     Pupils: Pupils are equal, round, and reactive to light.  Cardiovascular:     Rate and Rhythm: Normal rate.     Pulses: Normal pulses.  Pulmonary:     Effort: Pulmonary effort is normal.     Breath sounds: Normal breath sounds.  Abdominal:     General: Abdomen is flat.     Palpations: Abdomen is soft.  Musculoskeletal:        General: No swelling. Normal range of motion.     Cervical back: Normal range of motion and neck supple.  Skin:    General: Skin is warm and dry.  Neurological:     General: No focal deficit present.     Mental Status: He is alert and oriented to person, place, and time.  Psychiatric:        Attention and Perception: Attention and perception normal.         Mood and Affect: Mood and affect normal.        Speech: Speech normal.        Behavior: Behavior normal.        Thought Content: Thought content normal.        Judgment: Judgment normal.     Review of Systems  Constitutional: Negative for activity change and fatigue.  HENT: Negative for rhinorrhea and sore throat.   Eyes: Negative for photophobia and visual disturbance.  Respiratory: Negative for cough and shortness of breath.   Cardiovascular: Negative for chest pain and palpitations.  Gastrointestinal: Negative for constipation, diarrhea, nausea and vomiting.  Endocrine: Negative for cold intolerance and heat intolerance.  Genitourinary: Negative for difficulty urinating and dysuria.  Musculoskeletal: Negative for arthralgias and myalgias.  Skin: Negative for rash and wound.  Allergic/Immunologic: Negative for environmental allergies and food allergies.  Neurological: Negative for dizziness and headaches.  Hematological: Negative for adenopathy. Does not bruise/bleed easily.  Psychiatric/Behavioral: Negative for behavioral problems, hallucinations and suicidal ideas.  Blood pressure 118/86, pulse 66, temperature 98.6 F (37 C), temperature source Oral, resp. rate 18, height 5' 8.5" (1.74 m), weight 85 kg, SpO2 100 %.Body mass index is 28.09 kg/m.  General Appearance: Fairly Groomed  Patent attorney::  Good  Speech:  [phone]  Volume:  Normal  Mood:  Euthymic  Affect:  Congruent  Thought Process:  Coherent  Orientation:  Full (Time, Place, and Person)  Thought Content:  Tangential  Suicidal Thoughts:  No  Homicidal Thoughts:  No  Memory:  Immediate;   Fair Recent;   Fair Remote;   Poor  Judgement:  Intact  Insight:  Shallow  Psychomotor Activity:  Normal  Concentration:  Fair  Recall:  Poor  Fund of Knowledge:Poor  Language: Poor  Akathisia:  Negative  Handed:  Right  AIMS (if indicated):     Assets:  Desire for Improvement Financial  Resources/Insurance Housing Leisure Time Physical Health Resilience Social Support  Sleep:  Number of Hours: 2  Cognition: WNL  ADL's:  Intact           Has this patient used any form of tobacco in the last 30 days? (Cigarettes, Smokeless Tobacco, Cigars, and/or Pipes) No  Blood Alcohol level:  Lab Results  Component Value Date   ETH <10 05/15/2019   ETH <10 05/07/2019    Metabolic Disorder Labs:  Lab Results  Component Value Date   HGBA1C 5.2 02/23/2020   MPG 102.54 02/23/2020   MPG 102.54 03/27/2018   No results found for: PROLACTIN Lab Results  Component Value Date   CHOL 193 03/27/2018   TRIG 154 (H) 03/27/2018   HDL 51 03/27/2018   CHOLHDL 3.8 03/27/2018   VLDL 31 03/27/2018   LDLCALC 111 (H) 03/27/2018   LDLCALC 102 (H) 01/31/2017    See Psychiatric Specialty Exam and Suicide Risk Assessment completed by Attending Physician prior to discharge.  Discharge destination:  Home  Is patient on multiple antipsychotic therapies at discharge:  No   Has Patient had three or more failed trials of antipsychotic monotherapy by history:  No  Recommended Plan for Multiple Antipsychotic Therapies: NA  Discharge Instructions    Diet general   Complete by: As directed    Increase activity slowly   Complete by: As directed      Allergies as of 03/02/2020   No Known Allergies     Medication List    STOP taking these medications   benztropine 1 MG tablet Commonly known as: COGENTIN   hydrOXYzine 50 MG tablet Commonly known as: ATARAX/VISTARIL   Invega Sustenna 234 MG/1.5ML Susy injection Generic drug: paliperidone   traZODone 50 MG tablet Commonly known as: DESYREL     TAKE these medications     Indication  divalproex 500 MG 24 hr tablet Commonly known as: DEPAKOTE ER Take 1 tablet (500 mg total) by mouth at bedtime.  Indication: MIXED BIPOLAR AFFECTIVE DISORDER   paliperidone 9 MG 24 hr tablet Commonly known as: INVEGA Take 1 tablet (9 mg  total) by mouth daily.  Indication: Schizophrenia       Follow-up Information    [address] [facility] [place] [employer] Follow up.   Why: Referrals were sent.  They should follow up with you, however, if they do not please follow up with them.  Thanks! Contact information: [address] [place] [address] [phone]        [facility] Follow up.   Why: Referrals were sent.  They should follow up with you, however, if they do not please follow up with them.  Thanks! Contact information: [address] [place] [address] [phone]        [facility] Follow up.   Why: Referrals were sent.  They should follow up with you, however, if they do not please follow up with them.  Thanks! Contact information: [address] [place] [address] [phone]               Follow-up recommendations:  Activity:  as tolerated Diet:  regular diet  Comments:  30-day scripts with one refill sent to [facility] on [address] per sister's request  Signed: [name], MD 03/02/2020, 10:42 AM

## 2020-04-10 ENCOUNTER — Emergency Department
Admission: EM | Admit: 2020-04-10 | Discharge: 2020-04-12 | Disposition: A | Discharge status: Other Acute Inpatient Hospital | Payer: No Typology Code available for payment source | Attending: Emergency Medicine | Admitting: Emergency Medicine

## 2020-04-10 ENCOUNTER — Other Ambulatory Visit: Payer: Self-pay

## 2020-04-10 DIAGNOSIS — F209 Schizophrenia, unspecified: Secondary | ICD-10-CM | POA: Diagnosis present

## 2020-04-10 DIAGNOSIS — F17210 Nicotine dependence, cigarettes, uncomplicated: Secondary | ICD-10-CM | POA: Diagnosis not present

## 2020-04-10 DIAGNOSIS — Z20822 Contact with and (suspected) exposure to covid-19: Secondary | ICD-10-CM | POA: Insufficient documentation

## 2020-04-10 DIAGNOSIS — F201 Disorganized schizophrenia: Secondary | ICD-10-CM | POA: Insufficient documentation

## 2020-04-10 DIAGNOSIS — G2401 Drug induced subacute dyskinesia: Secondary | ICD-10-CM | POA: Diagnosis not present

## 2020-04-10 DIAGNOSIS — F17200 Nicotine dependence, unspecified, uncomplicated: Secondary | ICD-10-CM | POA: Diagnosis present

## 2020-04-10 DIAGNOSIS — Z046 Encounter for general psychiatric examination, requested by authority: Secondary | ICD-10-CM | POA: Diagnosis present

## 2020-04-10 NOTE — ED Notes (Signed)
Pt dressed out into hospital scrubs. Pt's belongings to include: 1 gray sweatshirt 1 pair of jeans 1 black hoodie 2 blue and black shoes

## 2020-04-10 NOTE — ED Notes (Signed)
Will allow MD to assess pt and see what is recommended on blood work.

## 2020-04-10 NOTE — ED Notes (Addendum)
Pt refusing to let this RN or EDT draw blood at this time. Pt given meal tray and will try again shortly.

## 2020-04-10 NOTE — ED Notes (Signed)
Hourly rounding completed at this time, patient currently awake in hallway bed. No complaints, stable, and in no acute distress. Q15 minute rounds and monitoring via Rover and Officer to continue. 

## 2020-04-10 NOTE — ED Provider Notes (Signed)
[facility] Emergency Department Provider Note   ____________________________________________   Event Date/Time   First MD Initiated Contact with Patient 04/10/20 2312     (approximate)  I have reviewed the triage vital signs and the nursing notes.   HISTORY  Chief Complaint IVC    HPI Shane Nash is a 40 y.o. male brought to the ED under IVC for history of schizophrenia off his medications with disorganized thinking and not taking care of himself.  History of hyponatremia requiring hospitalization.  Patient uncooperative with interview and lab work     Past Medical History:  Diagnosis Date  . Schizophrenia [facility])     Patient Active Problem List   Diagnosis Date Noted  . Hypoglycemia 02/23/2020  . Tardive dyskinesia 05/18/2019  . Tobacco use disorder 03/26/2018  . 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 Schizophrenia (HCC) 01/27/2017  . Hyponatremia 01/27/2017  . Polydipsia 01/27/2017    History reviewed. No pertinent surgical history.  Prior to Admission medications   Medication Sig Start Date End Date Taking? Authorizing Provider  divalproex (DEPAKOTE ER) 500 MG 24 hr tablet Take 1 tablet (500 mg total) by mouth at bedtime. 03/01/20   [name], MD  paliperidone (INVEGA) 9 MG 24 hr tablet Take 1 tablet (9 mg total) by mouth daily. 03/02/20   [name], MD    Allergies Patient has no known allergies.  No family history on file.  Social History Social History   Tobacco Use  . Smoking status: Current Every Day Smoker    Packs/day: 0.25    Years: 15.00    Pack years: 3.75    Types: Cigarettes  . Smokeless tobacco: Never Used  . Tobacco comment: Cannot state the amount he smokes per day  Vaping Use  . Vaping Use: Never used  Substance Use Topics  . Alcohol use: Not Currently  . Drug use: No    Review of Systems  Constitutional: No fever/chills Eyes: No visual changes. ENT: No sore  throat. Cardiovascular: Denies chest pain. Respiratory: Denies shortness of breath. Gastrointestinal: No abdominal pain.  No nausea, no vomiting.  No diarrhea.  No constipation. Genitourinary: Negative for dysuria. Musculoskeletal: Negative for back pain. Skin: Negative for rash. Neurological: Negative for headaches, focal weakness or numbness. Psychiatric:  Positive for not taking medications, disorganized thinking, not taking care of himself.   ____________________________________________   PHYSICAL EXAM:  VITAL SIGNS: ED Triage Vitals  Enc Vitals Group     BP 04/10/20 1812 137/83     Pulse Rate 04/10/20 1812 87     Resp 04/10/20 1812 18     Temp 04/10/20 1812 98.3 F (36.8 C)     Temp src --      SpO2 04/10/20 1812 100 %     Weight --      Height --      Head Circumference --      Peak Flow --      Pain Score 04/10/20 1803 0     Pain Loc --      Pain Edu? --      Excl. in GC? --     Constitutional: Asleep, awakened for exam.  Alert and oriented. Well appearing and in no acute distress. Eyes: Conjunctivae are normal. PERRL. EOMI. Head: Atraumatic. Nose: No congestion/rhinnorhea. Mouth/Throat: Mucous membranes are moist.  Oropharynx non-erythematous. Neck: No stridor.   Cardiovascular: Normal rate, regular rhythm. Grossly normal heart sounds.  Good peripheral circulation. Respiratory: Normal respiratory  effort.  No retractions. Lungs CTAB. Gastrointestinal: Soft and nontender. No distention. No abdominal bruits. No CVA tenderness. Musculoskeletal: No lower extremity tenderness nor edema.  No joint effusions. Neurologic:  Normal speech and language. No gross focal neurologic deficits are appreciated. No gait instability. Skin:  Skin is warm, dry and intact. No rash noted. Psychiatric: Mood and affect are flat. Speech and behavior are normal.  ____________________________________________   LABS (all labs ordered are listed, but only abnormal results are  displayed)  Labs Reviewed  COMPREHENSIVE METABOLIC PANEL - Abnormal; Notable for the following components:      Result Value   Total Protein 8.5 (*)    AST 71 (*)    All other components within normal limits  SALICYLATE LEVEL - Abnormal; Notable for the following components:   Salicylate Lvl <7.0 (*)    All other components within normal limits  ACETAMINOPHEN LEVEL - Abnormal; Notable for the following components:   Acetaminophen (Tylenol), Serum <10 (*)    All other components within normal limits  RESP PANEL BY RT-PCR (FLU A&B, COVID) ARPGX2  ETHANOL  CBC  URINE DRUG SCREEN, QUALITATIVE ([facility] ONLY)   ____________________________________________  EKG  None ____________________________________________  RADIOLOGY I, Jacinda Kanady J, personally viewed and evaluated these images (plain radiographs) as part of my medical decision making, as well as reviewing the written report by the radiologist.  ED MD interpretation: None  Official radiology report(s): No results found.  ____________________________________________   PROCEDURES  Procedure(s) performed (including Critical Care):  Procedures   ____________________________________________   INITIAL IMPRESSION / ASSESSMENT AND PLAN / ED COURSE  As part of my medical decision making, I reviewed the following data within the electronic MEDICAL RECORD NUMBER Nursing notes reviewed and incorporated, Old chart reviewed, A consult was requested and obtained from this/these consultant(s) Psychiatry and Notes from prior ED visits     40 year old schizophrenic male under IVC for not taking medications, disorganized thinking.  Currently refusing lab work.  We will continue to speak to patient regarding necessity of lab work given his prior history of hyponatremia requiring hospitalization.  Clinical Course as of 04/11/20 0540  Tue Apr 11, 2020  0303 Sodium within normal limits. The patient has been placed in psychiatric observation due  to the need to provide a safe environment for the patient while obtaining psychiatric consultation and evaluation, as well as ongoing medical and medication management to treat the patient's condition. The patient has been placed under full IVC at this time.   [JS]  0540 Patient evaluated by psychiatric NP pending BMU admission. [JS]    Clinical Course User Index [JS] Irean Hong, MD     ____________________________________________   FINAL CLINICAL IMPRESSION(S) / ED DIAGNOSES  Final diagnoses:  Schizophrenia, unspecified type [facility])     ED Discharge Orders    None      *Please note:  Shane Nash was evaluated in Emergency Department on 04/11/2020 for the symptoms described in the history of present illness. He was evaluated in the context of the global COVID-19 pandemic, which necessitated consideration that the patient might be at risk for infection with the SARS-CoV-2 virus that causes COVID-19. Institutional protocols and algorithms that pertain to the evaluation of patients at risk for COVID-19 are in a state of rapid change based on information released by regulatory bodies including the CDC and federal and state organizations. These policies and algorithms were followed during the patient's care in the ED.  Some ED evaluations and interventions may  be delayed as a result of limited staffing during and the pandemic.*   Note:  This document was prepared using Dragon voice recognition software and may include unintentional dictation errors.   Irean Hong, MD 04/11/20 [phone]

## 2020-04-10 NOTE — ED Notes (Signed)
Pt has apparent BO at this time, pt now asleep in bed.

## 2020-04-10 NOTE — ED Notes (Signed)
Patient transferred from Triage to room [facility] after dressing out and screening for contraband. Report received from Lisa,RN including situation, background, assessment and recommendations. Pt oriented to [employer] including Q15 minute rounds as well as Psychologist, counselling for their protection. Patient is alert and oriented, warm and dry in no acute distress. Patient denies SI, HI, and AVH. Pt. Encouraged to let this nurse know if needs arise.

## 2020-04-10 NOTE — ED Provider Notes (Incomplete)
[facility] Emergency Department Provider Note   ____________________________________________   Event Date/Time   First MD Initiated Contact with Patient 04/10/20 2312     (approximate)  I have reviewed the triage vital signs and the nursing notes.   HISTORY  Chief Complaint IVC    HPI Shane Nash is a 40 y.o. male brought to the ED under IVC for history of schizophrenia off his medications with disorganized thinking and not taking care of himself.  History of hyponatremia requiring hospitalization.  Patient uncooperative with interview and lab work     Past Medical History:  Diagnosis Date  . Schizophrenia [facility])     Patient Active Problem List   Diagnosis Date Noted  . Hypoglycemia 02/23/2020  . Tardive dyskinesia 05/18/2019  . Tobacco use disorder 03/26/2018  . 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 Schizophrenia (HCC) 01/27/2017  . Hyponatremia 01/27/2017  . Polydipsia 01/27/2017    History reviewed. No pertinent surgical history.  Prior to Admission medications   Medication Sig Start Date End Date Taking? Authorizing Provider  divalproex (DEPAKOTE ER) 500 MG 24 hr tablet Take 1 tablet (500 mg total) by mouth at bedtime. 03/01/20   [name], MD  paliperidone (INVEGA) 9 MG 24 hr tablet Take 1 tablet (9 mg total) by mouth daily. 03/02/20   [name], MD    Allergies Patient has no known allergies.  No family history on file.  Social History Social History   Tobacco Use  . Smoking status: Current Every Day Smoker    Packs/day: 0.25    Years: 15.00    Pack years: 3.75    Types: Cigarettes  . Smokeless tobacco: Never Used  . Tobacco comment: Cannot state the amount he smokes per day  Vaping Use  . Vaping Use: Never used  Substance Use Topics  . Alcohol use: Not Currently  . Drug use: No    Review of Systems  Constitutional: No fever/chills Eyes: No visual changes. ENT: No sore  throat. Cardiovascular: Denies chest pain. Respiratory: Denies shortness of breath. Gastrointestinal: No abdominal pain.  No nausea, no vomiting.  No diarrhea.  No constipation. Genitourinary: Negative for dysuria. Musculoskeletal: Negative for back pain. Skin: Negative for rash. Neurological: Negative for headaches, focal weakness or numbness. Psychiatric:  Positive for not taking medications, disorganized thinking, not taking care of himself.   ____________________________________________   PHYSICAL EXAM:  VITAL SIGNS: ED Triage Vitals  Enc Vitals Group     BP 04/10/20 1812 137/83     Pulse Rate 04/10/20 1812 87     Resp 04/10/20 1812 18     Temp 04/10/20 1812 98.3 F (36.8 C)     Temp src --      SpO2 04/10/20 1812 100 %     Weight --      Height --      Head Circumference --      Peak Flow --      Pain Score 04/10/20 1803 0     Pain Loc --      Pain Edu? --      Excl. in GC? --     Constitutional: Asleep, awakened for exam.  Alert and oriented. Well appearing and in no acute distress. Eyes: Conjunctivae are normal. PERRL. EOMI. Head: Atraumatic. Nose: No congestion/rhinnorhea. Mouth/Throat: Mucous membranes are moist.  Oropharynx non-erythematous. Neck: No stridor.   Cardiovascular: Normal rate, regular rhythm. Grossly normal heart sounds.  Good peripheral circulation. Respiratory: Normal respiratory  effort.  No retractions. Lungs CTAB. Gastrointestinal: Soft and nontender. No distention. No abdominal bruits. No CVA tenderness. Musculoskeletal: No lower extremity tenderness nor edema.  No joint effusions. Neurologic:  Normal speech and language. No gross focal neurologic deficits are appreciated. No gait instability. Skin:  Skin is warm, dry and intact. No rash noted. Psychiatric: Mood and affect are flat. Speech and behavior are normal.  ____________________________________________   LABS (all labs ordered are listed, but only abnormal results are  displayed)  Labs Reviewed  COMPREHENSIVE METABOLIC PANEL  ETHANOL  SALICYLATE LEVEL  ACETAMINOPHEN LEVEL  CBC  URINE DRUG SCREEN, QUALITATIVE ([facility] ONLY)   ____________________________________________  EKG  None ____________________________________________  RADIOLOGY I, SUNG,JADE J, personally viewed and evaluated these images (plain radiographs) as part of my medical decision making, as well as reviewing the written report by the radiologist.  ED MD interpretation: None  Official radiology report(s): No results found.  ____________________________________________   PROCEDURES  Procedure(s) performed (including Critical Care):  Procedures   ____________________________________________   INITIAL IMPRESSION / ASSESSMENT AND PLAN / ED COURSE  As part of my medical decision making, I reviewed the following data within the electronic MEDICAL RECORD NUMBER Nursing notes reviewed and incorporated, Old chart reviewed, A consult was requested and obtained from this/these consultant(s) Psychiatry and Notes from prior ED visits     40 year old schizophrenic male under IVC for not taking medications, disorganized thinking.  Currently refusing lab work.  We will continue to speak to patient regarding necessity of lab work given his prior history of hyponatremia requiring hospitalization.      ____________________________________________   FINAL CLINICAL IMPRESSION(S) / ED DIAGNOSES  Final diagnoses:  None     ED Discharge Orders    None      *Please note:  Shane Nash was evaluated in Emergency Department on 04/10/2020 for the symptoms described in the history of present illness. He was evaluated in the context of the global COVID-19 pandemic, which necessitated consideration that the patient might be at risk for infection with the SARS-CoV-2 virus that causes COVID-19. Institutional protocols and algorithms that pertain to the evaluation of patients at risk for  COVID-19 are in a state of rapid change based on information released by regulatory bodies including the CDC and federal and state organizations. These policies and algorithms were followed during the patient's care in the ED.  Some ED evaluations and interventions may be delayed as a result of limited staffing during and the pandemic.*   Note:  This document was prepared using Dragon voice recognition software and may include unintentional dictation errors.

## 2020-04-10 NOTE — ED Notes (Signed)
Pt speech is hard to understand, words are garbled together. Pt is not very talkative on assessment and uninterested with questions and conversations. Pt refuses to have blood drawn, states he has refuses all day and nothing is going to change that. States staff cannot have hs blood. Pt is laying in bed.Pt has history of water intoxication in past and is requesting water. Pt had snacks and water in lobby. Will continue to monitor.

## 2020-04-10 NOTE — ED Triage Notes (Signed)
Pt comes with c/o IVC with [facility] Department. Pt is not taking care of himself and not taking medications per family.   Pt denies any SI or HI.  Pt comes with wet urinated clothes. Family reports pt has been peeing and defecating on himself and not showering. Pt has hx of schizophrenia and has not been taking medications.

## 2020-04-11 DIAGNOSIS — F201 Disorganized schizophrenia: Secondary | ICD-10-CM

## 2020-04-11 LAB — LIPID PANEL
Cholesterol: 201 mg/dL — ABNORMAL HIGH (ref 0–200)
HDL: 66 mg/dL (ref >40)
LDL Cholesterol: 122 mg/dL — ABNORMAL HIGH (ref 0–99)
Total CHOL/HDL Ratio: 3 RATIO
Triglycerides: 64 mg/dL (ref <150)
VLDL: 13 mg/dL (ref 0–40)

## 2020-04-11 LAB — COMPREHENSIVE METABOLIC PANEL
ALT: 25 U/L (ref 0–44)
AST: 71 U/L — ABNORMAL HIGH (ref 15–41)
Albumin: 4.2 g/dL (ref 3.5–5.0)
Alkaline Phosphatase: 100 U/L (ref 38–126)
Anion gap: 11 (ref 5–15)
BUN: 11 mg/dL (ref 6–20)
CO2: 25 mmol/L (ref 22–32)
Calcium: 9.1 mg/dL (ref 8.9–10.3)
Chloride: 104 mmol/L (ref 98–111)
Creatinine, Ser: 0.88 mg/dL (ref 0.61–1.24)
GFR, Estimated: >60 mL/min (ref >60)
Glucose, Bld: 91 mg/dL (ref 70–99)
Potassium: 4.1 mmol/L (ref 3.5–5.1)
Sodium: 140 mmol/L (ref 135–145)
Total Bilirubin: 0.7 mg/dL (ref 0.3–1.2)
Total Protein: 8.5 g/dL — ABNORMAL HIGH (ref 6.5–8.1)

## 2020-04-11 LAB — ACETAMINOPHEN LEVEL: Acetaminophen (Tylenol), Serum: <10 ug/mL — ABNORMAL LOW (ref 10–30)

## 2020-04-11 LAB — HEMOGLOBIN A1C
Hgb A1c MFr Bld: 5.5 % (ref 4.8–5.6)
Mean Plasma Glucose: 111.15 mg/dL

## 2020-04-11 LAB — SALICYLATE LEVEL: Salicylate Lvl: <7 mg/dL — ABNORMAL LOW (ref 7.0–30.0)

## 2020-04-11 LAB — ETHANOL: Alcohol, Ethyl (B): <10 mg/dL (ref <10)

## 2020-04-11 LAB — RESP PANEL BY RT-PCR (FLU A&B, COVID) ARPGX2
Influenza A by PCR: NEGATIVE
Influenza B by PCR: NEGATIVE
SARS Coronavirus 2 by RT PCR: NEGATIVE

## 2020-04-11 LAB — CBC
HCT: 40.3 % (ref 39.0–52.0)
Hemoglobin: 14 g/dL (ref 13.0–17.0)
MCH: 30.3 pg (ref 26.0–34.0)
MCHC: 34.7 g/dL (ref 30.0–36.0)
MCV: 87.2 fL (ref 80.0–100.0)
Platelets: 359 10*3/uL (ref 150–400)
RBC: 4.62 MIL/uL (ref 4.22–5.81)
RDW: 12 % (ref 11.5–15.5)
WBC: 6.3 10*3/uL (ref 4.0–10.5)
nRBC: 0 % (ref 0.0–0.2)

## 2020-04-11 MED ORDER — DIVALPROEX SODIUM 500 MG PO DR TAB
500.0000 mg | DELAYED_RELEASE_TABLET | Freq: Two times a day (BID) | ORAL | Status: DC
  Administered 2020-04-11 – 2020-04-12 (×3): 500 mg via ORAL
  Filled 2020-04-11 (×3): qty 1

## 2020-04-11 MED ORDER — PALIPERIDONE ER 3 MG PO TB24
3.0000 mg | ORAL_TABLET | Freq: Two times a day (BID) | ORAL | Status: DC
  Administered 2020-04-11 – 2020-04-12 (×3): 3 mg via ORAL
  Filled 2020-04-11 (×5): qty 1

## 2020-04-11 MED ORDER — PALIPERIDONE PALMITATE ER 234 MG/1.5ML IM SUSY
234.0000 mg | PREFILLED_SYRINGE | Freq: Once | INTRAMUSCULAR | Status: AC
  Administered 2020-04-11: 234 mg via INTRAMUSCULAR
  Filled 2020-04-11: qty 1.5

## 2020-04-11 NOTE — ED Notes (Signed)
Hourly rounding completed at this time, patient currently asleep in hallway bed. No complaints, stable, and in no acute distress. Q15 minute rounds and monitoring via Rover and Officer to continue. 

## 2020-04-11 NOTE — BH Assessment (Signed)
Referral Check:   Shane Nash ([phone]- [phone])   [address] [phone])    [name] [phone] -[phone]) Re faxed referral at 10:25pm on 04/11/20    [name] [phone]) Per Jomarie Longs referral received but needs to be reviewed, referral person will be in tonight, suggest to call back later [facility] [phone])    [name] [phone])

## 2020-04-11 NOTE — ED Notes (Signed)
Pt has urinated on self and is in massive puddle of urine in bed, pt requires lots of convincing to get up and change and out of bed to be cleaned. After this pt has blood drawn and IV placed. Pt is slow moving and repeatedly states that "i'm not dumb," "I need to go to school," "I told you everything I know" "I don't need my blood drawn, it will kill me"

## 2020-04-11 NOTE — Consult Note (Signed)
[facility] Face-to-Face Psychiatry Consult   Reason for Consult: Consult for 40 year old man with a history of schizophrenia brought to the hospital because of poor self-care thought disorder disorganized behavior noncompliance with medicine Referring Physician: Katrinka Blazing Patient Identification: Shane Nash MRN:  [id number] Principal Diagnosis: Schizophrenia (HCC) Diagnosis:  Principal Problem:   Schizophrenia (HCC) Active Problems:   Disorganized schizophrenia (HCC)   Tobacco use disorder   Tardive dyskinesia   Total Time spent with patient: 1 hour  Subjective:   Shane Nash is a 40 y.o. male patient admitted with "I am trying to get off of those medicines".  HPI: Patient seen chart reviewed.  40 year old man with schizophrenia brought to the hospital because of poor self-care agitation medicine refusal at home.  Reportedly has been refusing medication.  Patient was described as having urinated and defecated on himself before he came into the hospital.  Thinking today on interview is very disorganized.  Speech is slurred and garbled.  Admits to not taking medicine.  Otherwise keeping a cover over his head staying very withdrawn.  Not able to give much other useful history.  Past Psychiatric History: Known history of schizophrenia with prior stability mostly when he was on long-acting injectables.  Does not tend to get violent but gets very disorganized with poor self-care.  Has a history of polydipsia but fortunately sodium is currently stable  Risk to Self:   Risk to Others:   Prior Inpatient Therapy:   Prior Outpatient Therapy:    Past Medical History:  Past Medical History:  Diagnosis Date  . Schizophrenia (HCC)    History reviewed. No pertinent surgical history. Family History: No family history on file. Family Psychiatric  History: See previous Social History:  Social History   Substance and Sexual Activity  Alcohol Use Not Currently     Social History   Substance and  Sexual Activity  Drug Use No    Social History   Socioeconomic History  . Marital status: Single    Spouse name: Not on file  . Number of children: Not on file  . Years of education: Not on file  . Highest education level: Not on file  Occupational History  . Not on file  Tobacco Use  . Smoking status: Current Every Day Smoker    Packs/day: 0.25    Years: 15.00    Pack years: 3.75    Types: Cigarettes  . Smokeless tobacco: Never Used  . Tobacco comment: Cannot state the amount he smokes per day  Vaping Use  . Vaping Use: Never used  Substance and Sexual Activity  . Alcohol use: Not Currently  . Drug use: No  . Sexual activity: Not Currently  Other Topics Concern  . Not on file  Social History Narrative  . Not on file   Social Determinants of Health   Financial Resource Strain: Not on file  Food Insecurity: Not on file  Transportation Needs: Not on file  Physical Activity: Not on file  Stress: Not on file  Social Connections: Not on file   Additional Social History:    Allergies:  No Known Allergies  Labs:  Results for orders placed or performed during the hospital encounter of 04/10/20 (from the past 48 hour(s))  Comprehensive metabolic panel     Status: Abnormal   Collection Time: 04/11/20  2:09 AM  Result Value Ref Range   Sodium 140 135 - 145 mmol/L   Potassium 4.1 3.5 - 5.1 mmol/L   Chloride 104 98 -  111 mmol/L   CO2 25 22 - 32 mmol/L   Glucose, Bld 91 70 - 99 mg/dL    Comment: Glucose reference range applies only to samples taken after fasting for at least 8 hours.   BUN 11 6 - 20 mg/dL   Creatinine, Ser 1.610.88 0.61 - 1.24 mg/dL   Calcium 9.1 8.9 - 09.610.3 mg/dL   Total Protein 8.5 (H) 6.5 - 8.1 g/dL   Albumin 4.2 3.5 - 5.0 g/dL   AST 71 (H) 15 - 41 U/L   ALT 25 0 - 44 U/L   Alkaline Phosphatase 100 38 - 126 U/L   Total Bilirubin 0.7 0.3 - 1.2 mg/dL   GFR, Estimated >04>60 >54>60 mL/min    Comment: (NOTE) Calculated using the CKD-EPI Creatinine Equation  (2021)    Anion gap 11 5 - 15    Comment: Performed at [facility], [address] [place], [place] [address]  Ethanol     Status: None   Collection Time: 04/11/20  2:09 AM  Result Value Ref Range   Alcohol, Ethyl (B) <10 <10 mg/dL    Comment: (NOTE) Lowest detectable limit for serum alcohol is 10 mg/dL.  For medical purposes only. Performed at [facility], [address] [place], [place] [address]   Salicylate level     Status: Abnormal   Collection Time: 04/11/20  2:09 AM  Result Value Ref Range   Salicylate Lvl <7.0 (L) 7.0 - 30.0 mg/dL    Comment: Performed at [facility], [address] [place], [place] [address]  Acetaminophen level     Status: Abnormal   Collection Time: 04/11/20  2:09 AM  Result Value Ref Range   Acetaminophen (Tylenol), Serum <10 (L) 10 - 30 ug/mL    Comment: (NOTE) Therapeutic concentrations vary significantly. A range of 10-30 ug/mL  may be an effective concentration for many patients. However, some  are best treated at concentrations outside of this range. Acetaminophen concentrations >150 ug/mL at 4 hours after ingestion  and >50 ug/mL at 12 hours after ingestion are often associated with  toxic reactions.  Performed at [facility], [address] [place], [place] [address]   cbc     Status: None   Collection Time: 04/11/20  2:09 AM  Result Value Ref Range   WBC 6.3 4.0 - 10.5 K/uL   RBC 4.62 4.22 - 5.81 MIL/uL   Hemoglobin 14.0 13.0 - 17.0 g/dL   HCT 57.840.3 46.939.0 - 62.952.0 %   MCV 87.2 80.0 - 100.0 fL   MCH 30.3 26.0 - 34.0 pg   MCHC 34.7 30.0 - 36.0 g/dL   RDW 52.812.0 41.311.5 - 24.415.5 %   Platelets 359 150 - 400 K/uL   nRBC 0.0 0.0 - 0.2 %    Comment: Performed at [facility], [address] [place], [place] [address]    Current Facility-Administered Medications  Medication Dose Route Frequency Provider Last Rate Last Admin  . divalproex (DEPAKOTE) DR tablet 500 mg  500 mg Oral Q12H  Sabel Hornbeck T, MD      . paliperidone (INVEGA SUSTENNA) injection 234 mg  234 mg Intramuscular Once Kriston Pasquarello T, MD      . paliperidone (INVEGA) 24 hr tablet 3 mg  3 mg Oral BID Yannick Steuber, Jackquline DenmarkJohn T, MD       Current Outpatient Medications  Medication Sig Dispense Refill  . divalproex (DEPAKOTE ER) 500 MG 24 hr tablet Take 1 tablet (500 mg  total) by mouth at bedtime. 30 tablet 1  . paliperidone (INVEGA) 9 MG 24 hr tablet Take 1 tablet (9 mg total) by mouth daily. 30 tablet 1    Musculoskeletal: Strength & Muscle Tone: within normal limits Gait & Station: normal Patient leans: N/A            Psychiatric Specialty Exam:  Presentation  General Appearance: Bizarre; Disheveled  Eye Contact:Poor  Speech:Garbled  Speech Volume:Increased  Handedness:Right   Mood and Affect  Mood:Irritable; Anxious; Euphoric  Affect:Inappropriate   Thought Process  Thought Processes:Disorganized  Descriptions of Associations:Loose  Orientation:Partial  Thought Content:Delusions; Illogical; Paranoid Ideation; Scattered  History of Schizophrenia/Schizoaffective disorder:Yes  Duration of Psychotic Symptoms:Less than six months  Hallucinations:Hallucinations: Other (comment) (Unable to assess)  Ideas of Reference:Delusions; Paranoia  Suicidal Thoughts:Suicidal Thoughts: No  Homicidal Thoughts:Homicidal Thoughts: No   Sensorium  Memory:Immediate Poor; Recent Poor; Remote Poor  Judgment:Impaired  Insight:Lacking   Executive Functions  Concentration:Poor  Attention Span:Poor  Recall:Poor  Fund of Knowledge:Poor  Language:Poor   Psychomotor Activity  Psychomotor Activity:No data recorded  Assets  Assets:Social Support   Sleep  Sleep:Sleep: Fair   Physical Exam: Physical Exam Vitals and nursing note reviewed.  Constitutional:      Appearance: Normal appearance.  HENT:     Head: Normocephalic and atraumatic.     Mouth/Throat:     Pharynx:  Oropharynx is clear.  Eyes:     Pupils: Pupils are equal, round, and reactive to light.  Cardiovascular:     Rate and Rhythm: Normal rate and regular rhythm.  Pulmonary:     Effort: Pulmonary effort is normal.     Breath sounds: Normal breath sounds.  Abdominal:     General: Abdomen is flat.     Palpations: Abdomen is soft.  Musculoskeletal:        General: Normal range of motion.  Skin:    General: Skin is warm and dry.  Neurological:     General: No focal deficit present.     Mental Status: He is alert. Mental status is at baseline.  Psychiatric:        Attention and Perception: He is inattentive.        Mood and Affect: Affect is blunt.        Speech: He is noncommunicative. Speech is delayed.        Behavior: Behavior is withdrawn.        Thought Content: Thought content is paranoid. Thought content does not include homicidal or suicidal ideation.        Cognition and Memory: Cognition is impaired. Memory is impaired.        Judgment: Judgment is inappropriate.    Review of Systems  Constitutional: Negative.   HENT: Negative.   Eyes: Negative.   Respiratory: Negative.   Cardiovascular: Negative.   Gastrointestinal: Negative.   Musculoskeletal: Negative.   Skin: Negative.   Neurological: Negative.   Psychiatric/Behavioral: The patient is nervous/anxious and has insomnia.    Blood pressure (!) 90/51, pulse 92, temperature 98.3 F (36.8 C), temperature source Oral, resp. rate 17, SpO2 95 %. There is no height or weight on file to calculate BMI.  Treatment Plan Summary: Medication management and Plan Patient is recommended for admission to the psychiatric ward.  He will be started back on medication including Depakote and Invega with plan to get him back on his shot.  We may not have a bed available today so I have decided to go ahead and order  the first of the Invega substance injections as well while he is in the ER.  Labs reviewed.  Anything missing will be ordered.   Ultimately plan admission to psychiatry.  Disposition: Recommend psychiatric Inpatient admission when medically cleared. Supportive therapy provided about ongoing stressors.  Mordecai Rasmussen, MD 04/11/2020 11:52 AM

## 2020-04-11 NOTE — ED Notes (Signed)
Pt ambulated to bathroom 

## 2020-04-11 NOTE — ED Notes (Signed)
Lunch tray served.

## 2020-04-11 NOTE — ED Notes (Signed)
Dr. Dolores Frame states that pt does not justify any IM medication at this time in order to collect labs due to pt actions. States that at this time we do not force pt to have labs drawn even despite past medical history. Will continue to monitor, pt is asleep at this time.

## 2020-04-11 NOTE — BH Assessment (Addendum)
Referral information for Psychiatric Hospitalization faxed to;   [facility] [phone]), No answer   [facility] [phone]), Intake staff are currently busy with patients in their lobby, intake staff have not reviewed faxed referrals yet  . [name] [phone] -[phone]), Currently under review  . [name] [phone] or [phone])  . [facility] [phone])  . [facility] [phone]),

## 2020-04-11 NOTE — Consult Note (Signed)
[facility] Face-to-Face Psychiatry Consult   Reason for Consult: IVC Referring Physician: Dr. Dolores Frame Patient Identification: Shane Nash MRN:  [id number] Principal Diagnosis: <principal problem not specified> Diagnosis:  Active Problems:   Schizophrenia (HCC)   Disorganized schizophrenia (HCC)   Tobacco use disorder   Total Time spent with patient: 20 minutes  Subjective:   Shane Nash is a 40 y.o. male patient presented to [facility] ED via law enforcement under involuntary commitment status (IVC).  Per the ED triage nurse note, Pt comes with c/o IVC with [facility] Department. Pt is not taking care of himself and not taking medications per family. Pt denies any SI or HI. Pt comes with wet urinated clothes. Family reports pt has been peeing and defecating on himself and not showering. Pt has hx of schizophrenia and has not been taking medications. The patient was seen face-to-face by this provider; the chart was reviewed and consulted with Dr. Dolores Frame on 04/11/2020 due to the patient's care. It was discussed with the EDP that the patient does meet the criteria to be admitted to the psychiatric inpatient unit.  On evaluation, the patient is alert and oriented x 2-3, agitated, uncooperative, and mood-congruent with affect. The patient does not appear to be responding to internal or external stimuli. The patient is presenting with delusional thinking. The patient denies auditory or visual hallucinations. The patient denies any suicidal, homicidal, or self-harm ideations. The patient is presenting with some psychotic and paranoid behaviors. During an encounter with the patient, he could not answer most questions appropriately.  HPI:    Past Psychiatric History:  Schizophrenia (HCC) Risk to Self:  Yes Risk to Others:  No Prior Inpatient Therapy:   Yes Prior Outpatient Therapy:  Yes  Past Medical History:  Past Medical History:  Diagnosis Date  . Schizophrenia (HCC)    History reviewed. No  pertinent surgical history. Family History: No family history on file. Family Psychiatric  History:  Social History:  Social History   Substance and Sexual Activity  Alcohol Use Not Currently     Social History   Substance and Sexual Activity  Drug Use No    Social History   Socioeconomic History  . Marital status: Single    Spouse name: Not on file  . Number of children: Not on file  . Years of education: Not on file  . Highest education level: Not on file  Occupational History  . Not on file  Tobacco Use  . Smoking status: Current Every Day Smoker    Packs/day: 0.25    Years: 15.00    Pack years: 3.75    Types: Cigarettes  . Smokeless tobacco: Never Used  . Tobacco comment: Cannot state the amount he smokes per day  Vaping Use  . Vaping Use: Never used  Substance and Sexual Activity  . Alcohol use: Not Currently  . Drug use: No  . Sexual activity: Not Currently  Other Topics Concern  . Not on file  Social History Narrative  . Not on file   Social Determinants of Health   Financial Resource Strain: Not on file  Food Insecurity: Not on file  Transportation Needs: Not on file  Physical Activity: Not on file  Stress: Not on file  Social Connections: Not on file   Additional Social History:    Allergies:  No Known Allergies  Labs:  Results for orders placed or performed during the hospital encounter of 04/10/20 (from the past 48 hour(s))  Comprehensive metabolic panel  Status: Abnormal   Collection Time: 04/11/20  2:09 AM  Result Value Ref Range   Sodium 140 135 - 145 mmol/L   Potassium 4.1 3.5 - 5.1 mmol/L   Chloride 104 98 - 111 mmol/L   CO2 25 22 - 32 mmol/L   Glucose, Bld 91 70 - 99 mg/dL    Comment: Glucose reference range applies only to samples taken after fasting for at least 8 hours.   BUN 11 6 - 20 mg/dL   Creatinine, Ser 4.74 0.61 - 1.24 mg/dL   Calcium 9.1 8.9 - 25.9 mg/dL   Total Protein 8.5 (H) 6.5 - 8.1 g/dL   Albumin 4.2 3.5 -  5.0 g/dL   AST 71 (H) 15 - 41 U/L   ALT 25 0 - 44 U/L   Alkaline Phosphatase 100 38 - 126 U/L   Total Bilirubin 0.7 0.3 - 1.2 mg/dL   GFR, Estimated >56 >38 mL/min    Comment: (NOTE) Calculated using the CKD-EPI Creatinine Equation (2021)    Anion gap 11 5 - 15    Comment: Performed at [facility], [address] [place], [place] [address]  Ethanol     Status: None   Collection Time: 04/11/20  2:09 AM  Result Value Ref Range   Alcohol, Ethyl (B) <10 <10 mg/dL    Comment: (NOTE) Lowest detectable limit for serum alcohol is 10 mg/dL.  For medical purposes only. Performed at [facility], [address] [place], [place] [address]   Salicylate level     Status: Abnormal   Collection Time: 04/11/20  2:09 AM  Result Value Ref Range   Salicylate Lvl <7.0 (L) 7.0 - 30.0 mg/dL    Comment: Performed at [facility], [address] [place], [place] [address]  Acetaminophen level     Status: Abnormal   Collection Time: 04/11/20  2:09 AM  Result Value Ref Range   Acetaminophen (Tylenol), Serum <10 (L) 10 - 30 ug/mL    Comment: (NOTE) Therapeutic concentrations vary significantly. A range of 10-30 ug/mL  may be an effective concentration for many patients. However, some  are best treated at concentrations outside of this range. Acetaminophen concentrations >150 ug/mL at 4 hours after ingestion  and >50 ug/mL at 12 hours after ingestion are often associated with  toxic reactions.  Performed at [facility], [address] [place], [place] [address]   cbc     Status: None   Collection Time: 04/11/20  2:09 AM  Result Value Ref Range   WBC 6.3 4.0 - 10.5 K/uL   RBC 4.62 4.22 - 5.81 MIL/uL   Hemoglobin 14.0 13.0 - 17.0 g/dL   HCT 16.0 10.9 - 32.3 %   MCV 87.2 80.0 - 100.0 fL   MCH 30.3 26.0 - 34.0 pg   MCHC 34.7 30.0 - 36.0 g/dL   RDW 55.7 32.2 - 02.5 %   Platelets 359 150 - 400 K/uL   nRBC 0.0 0.0 - 0.2 %    Comment: Performed at  [facility], [address] [place], [place] [address]    No current facility-administered medications for this encounter.   Current Outpatient Medications  Medication Sig Dispense Refill  . divalproex (DEPAKOTE ER) 500 MG 24 hr tablet Take 1 tablet (500 mg total) by mouth at bedtime. 30 tablet 1  . paliperidone (INVEGA) 9 MG 24 hr tablet Take 1 tablet (9 mg total) by mouth daily. 30 tablet 1    Musculoskeletal:  Strength & Muscle Tone: within normal limits Gait & Station: normal Patient leans: N/A   Psychiatric Specialty Exam:  Presentation  General Appearance: Bizarre; Disheveled  Eye Contact:Poor  Speech:Garbled  Speech Volume:Increased  Handedness:Right   Mood and Affect  Mood:Irritable; Anxious; Euphoric  Affect:Inappropriate   Thought Process  Thought Processes:Disorganized  Descriptions of Associations:Loose  Orientation:Partial  Thought Content:Delusions; Illogical; Paranoid Ideation; Scattered  History of Schizophrenia/Schizoaffective disorder:Yes  Duration of Psychotic Symptoms:Less than six months  Hallucinations:Hallucinations: Other (comment) (Unable to assess)  Ideas of Reference:Delusions; Paranoia  Suicidal Thoughts:Suicidal Thoughts: No  Homicidal Thoughts:Homicidal Thoughts: No   Sensorium  Memory:Immediate Poor; Recent Poor; Remote Poor  Judgment:Impaired  Insight:Lacking   Executive Functions  Concentration:Poor  Attention Span:Poor  Recall:Poor  Fund of Knowledge:Poor  Language:Poor   Psychomotor Activity  Psychomotor Activity:No data recorded  Assets  Assets:Social Support   Sleep  Sleep:Sleep: Fair   Physical Exam: Physical Exam Vitals and nursing note reviewed.  HENT:     Right Ear: External ear normal.     Left Ear: External ear normal.  Cardiovascular:     Rate and Rhythm: Normal rate.     Pulses: Normal pulses.  Pulmonary:     Effort: Pulmonary effort is normal.  Musculoskeletal:         General: Normal range of motion.     Cervical back: Normal range of motion and neck supple.  Neurological:     Mental Status: He is alert.  Psychiatric:        Attention and Perception: He is inattentive.        Mood and Affect: Mood is anxious. Affect is angry and inappropriate.        Speech: Speech is delayed.        Behavior: Behavior is uncooperative and aggressive.        Thought Content: Thought content is delusional.        Cognition and Memory: Cognition is impaired. Memory is impaired. He exhibits impaired recent memory and impaired remote memory.        Judgment: Judgment is inappropriate.    Review of Systems  Psychiatric/Behavioral: Positive for depression and hallucinations. The patient is nervous/anxious.   All other systems reviewed and are negative.  Blood pressure 137/83, pulse 87, temperature 98.3 F (36.8 C), resp. rate 18, SpO2 100 %. There is no height or weight on file to calculate BMI.  Treatment Plan Summary: Plan The patient is a safety risk to himself and others and requires psychiatric inpatient admission for stabilization and treatment.  Disposition: Recommend psychiatric Inpatient admission when medically cleared. Supportive therapy provided about ongoing stressors.  Gillermo Murdoch, NP 04/11/2020 3:24 AM

## 2020-04-11 NOTE — BH Assessment (Signed)
Pt currently under review with [facility] 

## 2020-04-11 NOTE — ED Notes (Addendum)
Pt still with pressured speech at this time and sentences remain scattered. Cannot keep up with patient thought process. Pt took IM injection when Florentina Addison, Charity fundraiser and [place], [place] held hand. Pt expresses no further needs at this time.

## 2020-04-11 NOTE — ED Notes (Addendum)
Dr.[name] at bedside  

## 2020-04-11 NOTE — ED Notes (Signed)
PT  IVC  MOVED  BACK  TO  RM  23  PENDING  PLACEMENT

## 2020-04-11 NOTE — ED Notes (Signed)
IVC/pending inpatient admission when medically cleared 

## 2020-04-11 NOTE — BH Assessment (Signed)
Referral Check:   [facility] ([phone]- [phone]) Placed on a long hold   [facility] [phone]) No answer    [name] [phone] -or- [phone]) Per Karie Soda no referral received, re-faxed at 6:25pm      [name] [phone] or [phone]) Per Jomarie Longs referral received but needs to be reviewed, referral person will be in tonight, suggest to call back later [facility] [phone]) No answer    [name] ([phone]---[phone]---[phone]) No answer

## 2020-04-11 NOTE — BH Assessment (Signed)
TTS spoke to pt's legal guardian Shane Nash [phone]) who expressed concerns with pt's current presentation (defecating, urinating on self, not eating, drinking lots of water, refusing medications). Shane Nash reports pt's non compliance with medications for a week due to his psych MD with [facility] being on vacation and being unable to access his medications. Shane Nash reports pt's psych MD returning Monday and pt to refuse his medications at that time. Shane Nash reports no safety concerns and expressed pt to need a monthly injection to ensure access to medication on a routine basis.

## 2020-04-11 NOTE — BH Assessment (Signed)
Comprehensive Clinical Assessment (CCA) Note  04/11/2020 Shane Nash [id number]  Chief Complaint: Patient is a 40 year old male presenting to [facility] ED under IVC. Per triage note Pt comes with c/o IVC with [facility]. Pt is not taking care of himself and not taking medications per family. Pt denies any SI or HI. Pt comes with wet urinated clothes. Family reports pt has been peeing and defecating on himself and not showering. Pt has hx of schizophrenia and has not been taking medications. During assessment patient appears alert and oriented x2, patient is unaware of the situation. Patient's thoughts are disorganized, speech is pressured and difficult to comprehend, patient is unable to answer most questions in the assessment. When asked if patient understands why he is in the ED patient's speech is pressured. When asked if patient knows where he lives patient reports "I'm trying to get my own place" and reports that he lives in [place], patient is unable to answer anything further.  Patient is currently being managed by an ACT team, collateral was attempted at [phone] but no answer. Collateral was attempted from patient's sister and legal guardian Welford Christmas [phone] but no answer, a HIPAA compliant voicemail was left to return phone call  Per Psyc NP Elenore Paddy patient is recommended for Inpatient Hospitalization  Chief Complaint  Patient presents with  . IVC   Visit Diagnosis: Schizophrenia   CCA Screening, Triage and Referral (STR)  Patient Reported Information How did you hear about [place]? Legal System  Referral name: No data recorded Referral phone number: No data recorded  Whom do you see for routine medical problems? Other (Comment)  Practice/Facility Name: No data recorded Practice/Facility Phone Number: No data recorded Name of Contact: No data recorded Contact Number: No data recorded Contact Fax Number: No data recorded Prescriber  Name: No data recorded Prescriber Address (if known): No data recorded  What Is the Reason for Your Visit/Call Today? When asked this question, patient consistently stated he did the wrong thing. It was extremely difficult to understand patient. Patient was talking very fast and his words were running together. ie..gibberish.  How Long Has This Been Causing You Problems? > than 6 months  What Do You Feel Would Help You the Most Today? Treatment for Depression or other mood problem   Have You Recently Been in Any Inpatient Treatment (Hospital/Detox/Crisis Center/28-Day Program)? Yes  Name/Location of Program/Hospital:[facility]  How Long Were You There? 01/2020  When Were You Discharged? No data recorded  Have You Ever Received Services From [facility] Before? Yes  Who Do You See at [facility]? Inpatient treatment   Have You Recently Had Any Thoughts About Hurting Yourself? No  Are You Planning to Commit Suicide/Harm Yourself At This time? No   Have you Recently Had Thoughts About Hurting Someone [name]? No  Explanation: No data recorded  Have You Used Any Alcohol or Drugs in the Past 24 Hours? No  How Long Ago Did You Use Drugs or Alcohol? No data recorded What Did You Use and How Much? No data recorded  Do You Currently Have a Therapist/Psychiatrist? Yes  Name of Therapist/Psychiatrist: ACT team   Have You Been Recently Discharged From Any Office Practice or Programs? No  Explanation of Discharge From Practice/Program: No data recorded    CCA Screening Triage Referral Assessment Type of Contact: Face-to-Face  Is this Initial or Reassessment? No data recorded Date Telepsych consult ordered in [facility]:  No data recorded Time Telepsych consult  ordered in [facility]:  No data recorded  Patient Reported Information Reviewed? Yes  Patient Left Without Being Seen? No data recorded Reason for Not Completing Assessment: Patient would not participate with the  assessor   Collateral Involvement: ACT team   Does Patient Have a Court Appointed Legal Guardian? No data recorded Name and Contact of Legal Guardian: Self  If Minor and Not Living with Parent(s), Who has Custody? No data recorded Is CPS involved or ever been involved? Never  Is APS involved or ever been involved? Never   Patient Determined To Be At Risk for Harm To Self or Others Based on Review of Patient Reported Information or Presenting Complaint? No  Method: No data recorded Availability of Means: No data recorded Intent: No data recorded Notification Required: No data recorded Additional Information for Danger to Others Potential: No data recorded Additional Comments for Danger to Others Potential: No data recorded Are There Guns or Other Weapons in Your Home? No data recorded Types of Guns/Weapons: No data recorded Are These Weapons Safely Secured?                            No data recorded Who Could Verify You Are Able To Have These Secured: No data recorded Do You Have any Outstanding Charges, Pending Court Dates, Parole/Probation? No data recorded Contacted To Inform of Risk of Harm To Self or Others: No data recorded  Location of Assessment: [facility] ED   Does Patient Present under Involuntary Commitment? Yes  IVC Papers Initial File Date: 04/11/2020   [place] of Residence: [place]   Patient Currently Receiving the Following Services: ACTT Psychologist, educational)   Determination of Need: Emergent (2 hours)   Options For Referral: Inpatient Hospitalization; Medication Management; Outpatient Therapy     CCA Biopsychosocial Intake/Chief Complaint:  Patient is presenting to ED under IVC due to not taking care of himself and not taking medications  Current Symptoms/Problems: Patient is presenting to ED under IVC due to not taking care of himself and not taking medications   Patient Reported Schizophrenia/Schizoaffective Diagnosis in Past:  Yes   Strengths: Unknown  Preferences: Unknown  Abilities: Unknown   Type of Services Patient Feels are Needed: Unknown   Initial Clinical Notes/Concerns: None   Mental Health Symptoms Depression:  Change in energy/activity   Duration of Depressive symptoms: Greater than two weeks   Mania:  None   Anxiety:   None   Psychosis:  Delusions; Grossly disorganized or catatonic behavior; Grossly disorganized speech   Duration of Psychotic symptoms: Less than six months   Trauma:  None   Obsessions:  None   Compulsions:  Absent insight/delusional   Inattention:  None   Hyperactivity/Impulsivity:  N/A   Oppositional/Defiant Behaviors:  None   Emotional Irregularity:  None   Other Mood/Personality Symptoms:  No data recorded   Mental Status Exam Appearance and self-care  Stature:  Tall   Weight:  Average weight   Clothing:  Disheveled   Grooming:  Neglected   Cosmetic use:  None   Posture/gait:  Slumped   Motor activity:  Not Remarkable   Sensorium  Attention:  Confused   Concentration:  Preoccupied   Orientation:  Person; Place   Recall/memory:  Defective in Short-term   Affect and Mood  Affect:  Flat   Mood:  Depressed   Relating  Eye contact:  Normal   Facial expression:  Constricted   Attitude toward examiner:  Guarded  Thought and Language  Speech flow: Pressured   Thought content:  Delusions   Preoccupation:  None   Hallucinations:  Other (Comment) (Unknown)   Organization:  No data recorded  Affiliated Computer ServicesExecutive Functions  Fund of Knowledge:  Fair   Intelligence:  Below average   Abstraction:  Functional   Judgement:  Poor   Reality Testing:  Distorted   Insight:  Lacking; None/zero insight; Poor   Decision Making:  Impulsive   Social Functioning  Social Maturity:  Isolates   Social Judgement:  Heedless   Stress  Stressors:  Other (Comment)   Coping Ability:  Exhausted   Skill Deficits:  Activities of daily  living; Communication; Decision making; Self-care   Supports:  Family     Religion: Religion/Spirituality Are You A Religious Person?: No  Leisure/Recreation: Leisure / Recreation Do You Have Hobbies?: No  Exercise/Diet: Exercise/Diet Do You Exercise?: No Have You Gained or Lost A Significant Amount of Weight in the Past Six Months?: No Do You Follow a Special Diet?: No Do You Have Any Trouble Sleeping?: No   CCA Employment/Education Employment/Work Situation: Employment / Work Situation Employment situation: On disability Why is patient on disability: Mental Health How long has patient been on disability: Unknown Patient's job has been impacted by current illness: No What is the longest time patient has a held a job?: Unknown Where was the patient employed at that time?: Unknown Has patient ever been in the [employer] Lilly and Companymilitary?: No  Education: Education Is Patient Currently Attending School?: No Did Garment/textile technologistYou Graduate From [employer] School?:  (UTA)   CCA Family/Childhood History Family and Relationship History: Family history Marital status: Single Are you sexually active?: No What is your sexual orientation?: Unknown Has your sexual activity been affected by drugs, alcohol, medication, or emotional stress?: Unknown Does patient have children?: No  Childhood History:  Childhood History By whom was/is the patient raised?: Other (Comment) Additional childhood history information: Unable to report Description of patient's relationship with caregiver when they were a child: Unable to report Patient's description of current relationship with people who raised him/her: Unable to report How were you disciplined when you got in trouble as a child/adolescent?: Unable to report Does patient have siblings?: Yes Number of Siblings: 1 Description of patient's current relationship with siblings: Patient currently lives with sister and is the legal guardian Did patient suffer any  verbal/emotional/physical/sexual abuse as a child?:  (UTA) Did patient suffer from severe childhood neglect?:  (UTA) Has patient ever been sexually abused/assaulted/raped as an adolescent or adult?:  (UTA) Was the patient ever a victim of a crime or a disaster?:  (UTA) Witnessed domestic violence?:  (UTA) 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?:  Industrial/product designer(UTA)  Child/Adolescent Assessment:     CCA Substance Use Alcohol/Drug Use: Alcohol / Drug Use Pain Medications: See MAR Prescriptions: See MAR Over the Counter: See MAR History of alcohol / drug use?: No history of alcohol / drug abuse                         ASAM's:  Six Dimensions of Multidimensional Assessment  Dimension 1:  Acute Intoxication and/or Withdrawal Potential:      Dimension 2:  Biomedical Conditions and Complications:      Dimension 3:  Emotional, Behavioral, or Cognitive Conditions and Complications:     Dimension 4:  Readiness to Change:     Dimension 5:  Relapse, Continued use, or Continued Problem Potential:  Dimension 6:  Recovery/Living Environment:     ASAM Severity Score:    ASAM Recommended Level of Treatment:     Substance use Disorder (SUD)    Recommendations for Services/Supports/Treatments: Per Psyc NP Elenore Paddy patient is recommended for Inpatient Hospitalization Recommendations for Services/Supports/Treatments Recommendations For Services/Supports/Treatments: Inpatient Hospitalization,Medication Management,ACCTT (Assertive Community Treatment)  DSM5 Diagnoses: Patient Active Problem List   Diagnosis Date Noted  . Hypoglycemia 02/23/2020  . Tardive dyskinesia 05/18/2019  . Tobacco use disorder 03/26/2018  . 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 Schizophrenia (HCC) 01/27/2017  . Hyponatremia 01/27/2017  . Polydipsia 01/27/2017    Patient Centered Plan: Patient is on the following Treatment Plan(s):  Schizophrenia   Referrals to Alternative  Service(s): Referred to Alternative Service(s):   Place:   Date:   Time:    Referred to Alternative Service(s):   Place:   Date:   Time:    Referred to Alternative Service(s):   Place:   Date:   Time:    Referred to Alternative Service(s):   Place:   Date:   Time:     Suzan Manon A Kinjal Neitzke, LCAS-A

## 2020-04-11 NOTE — ED Notes (Signed)
Patient unwilling to get a covid swab. [facility] nurse made aware and patient to be moved to [place] area.

## 2020-04-11 NOTE — ED Provider Notes (Signed)
Emergency Medicine Observation Re-evaluation Note  Shane Nash is a 40 y.o. male, seen on rounds today.  Pt initially presented to the ED for complaints of IVC Currently, the patient is calm, no acute complaints.  Physical Exam  BP (!) 90/51 (BP Location: Left Arm)   Pulse 92   Temp 98.3 F (36.8 C) (Oral)   Resp 17   SpO2 95%  Physical Exam General: calm Cardiac: regular rate Lungs: equal chest rise Psych: calm  ED Course / MDM  EKG:  Clinical Course as of 04/11/20 1614  Tue Apr 11, 2020  0303 Sodium within normal limits. The patient has been placed in psychiatric observation due to the need to provide a safe environment for the patient while obtaining psychiatric consultation and evaluation, as well as ongoing medical and medication management to treat the patient's condition. The patient has been placed under full IVC at this time.   [JS]  0540 Patient evaluated by psychiatric NP pending BMU admission. [JS]    Clinical Course User Index [JS] Irean Hong, MD   I have reviewed the labs performed to date as well as medications administered while in observation.  Recent changes in the last 24 hours include none.  Plan  Current plan is for inpatient admission. Patient is under full IVC at this time.   Gilles Chiquito, MD 04/11/20 [phone]

## 2020-04-11 NOTE — ED Notes (Signed)
Pt purposely made nose bleed to ask this RN for "cold water" to make it stop. Pt has hx of water intoxication so this RN refrained from giving water at this time and gave patient tissues for nose bleed.

## 2020-04-11 NOTE — ED Notes (Signed)
IVC  MOVED  TO  BHU UNIT  PENDING  PLACEMENT

## 2020-04-12 LAB — URINE DRUG SCREEN, QUALITATIVE (ARMC ONLY)
Amphetamines, Ur Screen: NOT DETECTED
Barbiturates, Ur Screen: NOT DETECTED
Benzodiazepine, Ur Scrn: NOT DETECTED
Cannabinoid 50 Ng, Ur ~~LOC~~: NOT DETECTED
Cocaine Metabolite,Ur ~~LOC~~: NOT DETECTED
MDMA (Ecstasy)Ur Screen: NOT DETECTED
Methadone Scn, Ur: NOT DETECTED
Opiate, Ur Screen: NOT DETECTED
Phencyclidine (PCP) Ur S: NOT DETECTED
Tricyclic, Ur Screen: NOT DETECTED

## 2020-04-12 MED ORDER — LORAZEPAM 2 MG PO TABS
2.0000 mg | ORAL_TABLET | ORAL | Status: DC | PRN
  Administered 2020-04-12: 2 mg via ORAL
  Filled 2020-04-12: qty 1

## 2020-04-12 NOTE — ED Notes (Signed)
IVC/PT has a bed assignment for [facility] today after 9am

## 2020-04-12 NOTE — ED Notes (Signed)
Hourly rounding completed at this time, patient currently awake in room. No complaints, stable, and in no acute distress. Q15 minute rounds and monitoring via Security Cameras to continue. 

## 2020-04-12 NOTE — ED Notes (Signed)
Shower supplies given. Pt is currently is taking a shower. Linen change. UA obtained and taken to lab by Selena Batten, RN.

## 2020-04-12 NOTE — ED Notes (Signed)
Pt made aware that urine sample is needed for advancement of care. Pt offered urine cup to provide sample, pt states he cannot urinate now and needs several more cups of water. Pt then picks up water cup and drinks rest and starts to try to eat all of the ice. Pt made aware he was not receiving more water now and that a urine sample is needed. Will monitor when pt goes to restroom and provide cup for sample, security watching cameras also made aware and asked to let ths nurse know when pt gets up to go to restroom.

## 2020-04-12 NOTE — ED Notes (Signed)
Report called to [facility]. Awaiting transport to transfer patient.

## 2020-04-12 NOTE — ED Notes (Signed)
[place]  [facility]  SHERIFF  DEPT  CALLED  FOR  TRANSPORT  TO  [facility] 

## 2020-04-12 NOTE — Consult Note (Signed)
[facility]   Reason for Consult: Follow-up consult 40 year old man with schizophrenia.  Patient received long-acting injectable and oral medication.  He is significantly calmer now.  No specific complaints.  Drowsy at times speech is a little slurred.  Vitals however are stable.  [facility] has been accepted [facility] Referring Physician: Katrinka BlazingSmith Patient Identification: Shane Germanyyrone L Lovins MRN:  [id number] Principal Diagnosis: Schizophrenia [facility](HCC) Diagnosis:  Principal Problem:   Schizophrenia (HCC) Active Problems:   Disorganized schizophrenia (HCC)   Tobacco use disorder   Tardive dyskinesia   Total Time spent with patient: 30 minutes  Subjective:   Shane Nash is a 40 y.o. male patient admitted with "where am I".  HPI: See note above.  Patient has been accepted to [facility].  He is calmer but still disorganized in his thinking.  Physically appears to be okay.  When told he was going to the [place] in [place] became somewhat irritable but not threatening.  I have put in an order to allow him to get some Ativan prior to transport.  Team is aware of the plan and he will be transported to an inpatient psychiatric unit where he can receive complete treatment.  Past Psychiatric History: Recurrent psychosis  Risk to Self:   Risk to Others:   Prior Inpatient Therapy:   Prior Outpatient Therapy:    Past Medical History:  Past Medical History:  Diagnosis Date  . Schizophrenia (HCC)    History reviewed. No pertinent surgical history. Family History: No family history on file. Family Psychiatric  History: See previous Social History:  Social History   Substance and Sexual Activity  Alcohol Use Not Currently     Social History   Substance and Sexual Activity  Drug Use No    Social History   Socioeconomic History  . Marital status: Single    Spouse name: Not on file  . Number of children: Not on file  . Years of education: Not on file  .  Highest education level: Not on file  Occupational History  . Not on file  Tobacco Use  . Smoking status: Current Every Day Smoker    Packs/day: 0.25    Years: 15.00    Pack years: 3.75    Types: Cigarettes  . Smokeless tobacco: Never Used  . Tobacco comment: Cannot state the amount he smokes per day  Vaping Use  . Vaping Use: Never used  Substance and Sexual Activity  . Alcohol use: Not Currently  . Drug use: No  . Sexual activity: Not Currently  Other Topics Concern  . Not on file  Social History Narrative  . Not on file   Social Determinants of Health   Financial Resource Strain: Not on file  Food Insecurity: Not on file  Transportation Needs: Not on file  Physical Activity: Not on file  Stress: Not on file  Social Connections: Not on file   Additional Social History:    Allergies:  No Known Allergies  Labs:  Results for orders placed or performed during the hospital encounter of 04/10/20 (from the past 48 hour(s))  Lipid panel     Status: Abnormal   Collection Time: 04/11/20  2:08 AM  Result Value Ref Range   Cholesterol 201 (H) 0 - 200 mg/dL   Triglycerides 64 <409<150 mg/dL   HDL 66 >81>40 mg/dL   Total CHOL/HDL Ratio 3.0 RATIO   VLDL 13 0 - 40 mg/dL   LDL Cholesterol 191122 (H) 0 - 99  mg/dL    Comment:        Total Cholesterol/HDL:CHD Risk Coronary Heart Disease Risk Table                     Men   Women  1/2 Average Risk   3.4   3.3  Average Risk       5.0   4.4  2 X Average Risk   9.6   7.1  3 X Average Risk  23.4   11.0        Use the calculated Patient Ratio above and the CHD Risk Table to determine the patient's CHD Risk.        ATP III CLASSIFICATION (LDL):  <100     mg/dL   Optimal  960-454  mg/dL   Near or Above                    Optimal  130-159  mg/dL   Borderline  098-119  mg/dL   High  >147     mg/dL   Very High Performed at [facility], [address] [place], [place] [address]   Hemoglobin A1c     Status: None   Collection  Time: 04/11/20  2:08 AM  Result Value Ref Range   Hgb A1c MFr Bld 5.5 4.8 - 5.6 %    Comment: (NOTE) Pre diabetes:          5.7%-6.4%  Diabetes:              >6.4%  Glycemic control for   <7.0% adults with diabetes    Mean Plasma Glucose 111.15 mg/dL    Comment: Performed at [facility], [address] [place], [place] [address]  Comprehensive metabolic panel     Status: Abnormal   Collection Time: 04/11/20  2:09 AM  Result Value Ref Range   Sodium 140 135 - 145 mmol/L   Potassium 4.1 3.5 - 5.1 mmol/L   Chloride 104 98 - 111 mmol/L   CO2 25 22 - 32 mmol/L   Glucose, Bld 91 70 - 99 mg/dL    Comment: Glucose reference range applies only to samples taken after fasting for at least 8 hours.   BUN 11 6 - 20 mg/dL   Creatinine, Ser 6.57 0.61 - 1.24 mg/dL   Calcium 9.1 8.9 - 84.6 mg/dL   Total Protein 8.5 (H) 6.5 - 8.1 g/dL   Albumin 4.2 3.5 - 5.0 g/dL   AST 71 (H) 15 - 41 U/L   ALT 25 0 - 44 U/L   Alkaline Phosphatase 100 38 - 126 U/L   Total Bilirubin 0.7 0.3 - 1.2 mg/dL   GFR, Estimated >96 >29 mL/min    Comment: (NOTE) Calculated using the CKD-EPI Creatinine Equation (2021)    Anion gap 11 5 - 15    Comment: Performed at [facility], [address] [place], [place] [address]  Ethanol     Status: None   Collection Time: 04/11/20  2:09 AM  Result Value Ref Range   Alcohol, Ethyl (B) <10 <10 mg/dL    Comment: (NOTE) Lowest detectable limit for serum alcohol is 10 mg/dL.  For medical purposes only. Performed at [facility], [address] [place], [place] [address]   Salicylate level     Status: Abnormal   Collection Time: 04/11/20  2:09 AM  Result Value Ref Range   Salicylate Lvl <7.0 (L) 7.0 - 30.0 mg/dL  Comment: Performed at [facility], [address] [place], [place] [address]  Acetaminophen level     Status: Abnormal   Collection Time: 04/11/20  2:09 AM  Result Value Ref Range   Acetaminophen (Tylenol), Serum <10  (L) 10 - 30 ug/mL    Comment: (NOTE) Therapeutic concentrations vary significantly. A range of 10-30 ug/mL  may be an effective concentration for many patients. However, some  are best treated at concentrations outside of this range. Acetaminophen concentrations >150 ug/mL at 4 hours after ingestion  and >50 ug/mL at 12 hours after ingestion are often associated with  toxic reactions.  Performed at [facility], [address] [place], [place] [address]   cbc     Status: None   Collection Time: 04/11/20  2:09 AM  Result Value Ref Range   WBC 6.3 4.0 - 10.5 K/uL   RBC 4.62 4.22 - 5.81 MIL/uL   Hemoglobin 14.0 13.0 - 17.0 g/dL   HCT 32.9 51.8 - 84.1 %   MCV 87.2 80.0 - 100.0 fL   MCH 30.3 26.0 - 34.0 pg   MCHC 34.7 30.0 - 36.0 g/dL   RDW 66.0 63.0 - 16.0 %   Platelets 359 150 - 400 K/uL   nRBC 0.0 0.0 - 0.2 %    Comment: Performed at [facility], [address], [place], [place] [address]  Resp Panel by RT-PCR (Flu A&B, Covid) Nasopharyngeal Swab     Status: None   Collection Time: 04/11/20  6:54 PM   Specimen: Nasopharyngeal Swab; Nasopharyngeal(NP) swabs in vial transport medium  Result Value Ref Range   SARS Coronavirus 2 by RT PCR NEGATIVE NEGATIVE    Comment: (NOTE) SARS-CoV-2 target nucleic acids are NOT DETECTED.  The SARS-CoV-2 RNA is generally detectable in upper respiratory specimens during the acute phase of infection. The lowest concentration of SARS-CoV-2 viral copies this assay can detect is 138 copies/mL. A negative result does not preclude SARS-Cov-2 infection and should not be used as the sole basis for treatment or other patient management decisions. A negative result may occur with  improper specimen collection/handling, submission of specimen other than nasopharyngeal swab, presence of viral mutation(s) within the areas targeted by this assay, and inadequate number of viral copies(<138 copies/mL). A negative result must be combined  with clinical observations, patient history, and epidemiological information. The expected result is Negative.  Fact Sheet for Patients:  BloggerCourse.com  Fact Sheet for Healthcare Providers:  SeriousBroker.it  This test is no t yet approved or cleared by the [place] FDA and  has been authorized for detection and/or diagnosis of SARS-CoV-2 by FDA under an Emergency Use Authorization (EUA). This EUA will remain  in effect (meaning this test can be used) for the duration of the COVID-19 declaration under Section 564(b)(1) of the Act, 21 U.S.C.section 360bbb-3(b)(1), unless the authorization is terminated  or revoked sooner.       Influenza A by PCR NEGATIVE NEGATIVE   Influenza B by PCR NEGATIVE NEGATIVE    Comment: (NOTE) The Xpert Xpress SARS-CoV-2/FLU/RSV plus assay is intended as an aid in the diagnosis of influenza from Nasopharyngeal swab specimens and should not be used as a sole basis for treatment. Nasal washings and aspirates are unacceptable for Xpert Xpress SARS-CoV-2/FLU/RSV testing.  Fact Sheet for Patients: BloggerCourse.com  Fact Sheet for Healthcare Providers: SeriousBroker.it  This test is not yet approved or cleared by the [place] FDA and has been authorized for detection and/or diagnosis of  SARS-CoV-2 by FDA under an Emergency Use Authorization (EUA). This EUA will remain in effect (meaning this test can be used) for the duration of the COVID-19 declaration under Section 564(b)(1) of the Act, 21 U.S.C. section 360bbb-3(b)(1), unless the authorization is terminated or revoked.  Performed at [facility], [address] [place], [place] [address]     Current Facility-Administered Medications  Medication Dose Route Frequency Provider Last Rate Last Admin  . divalproex (DEPAKOTE) DR tablet 500 mg  500 mg Oral Q12H Hazely Sealey, [name],  MD   500 mg at 04/12/20 0914  . LORazepam (ATIVAN) tablet 2 mg  2 mg Oral PRN Lyndel Sarate, [name], MD   2 mg at 04/12/20 1416  . paliperidone (INVEGA) 24 hr tablet 3 mg  3 mg Oral BID Montina Dorrance, [name], MD   3 mg at 04/12/20 6948   Current Outpatient Medications  Medication Sig Dispense Refill  . divalproex (DEPAKOTE ER) 500 MG 24 hr tablet Take 1 tablet (500 mg total) by mouth at bedtime. 30 tablet 1  . paliperidone (INVEGA) 9 MG 24 hr tablet Take 1 tablet (9 mg total) by mouth daily. 30 tablet 1    Musculoskeletal: Strength & Muscle Tone: within normal limits Gait & Station: normal Patient leans: N/A            Psychiatric Specialty Exam:  Presentation  General Appearance: Bizarre; Disheveled  Eye Contact:Poor  Speech:Garbled  Speech Volume:Increased  Handedness:Right   Mood and Affect  Mood:Irritable; Anxious; Euphoric  Affect:Inappropriate   Thought Process  Thought Processes:Disorganized  Descriptions of Associations:Loose  Orientation:Partial  Thought Content:Delusions; Illogical; Paranoid Ideation; Scattered  History of Schizophrenia/Schizoaffective disorder:Yes  Duration of Psychotic Symptoms:Less than six months  Hallucinations:Hallucinations: Other (comment) (Unable to assess)  Ideas of Reference:Delusions; Paranoia  Suicidal Thoughts:Suicidal Thoughts: No  Homicidal Thoughts:Homicidal Thoughts: No   Sensorium  Memory:Immediate Poor; Recent Poor; Remote Poor  Judgment:Impaired  Insight:Lacking   Executive Functions  Concentration:Poor  Attention Span:Poor  Recall:Poor  Fund of Knowledge:Poor  Language:Poor   Psychomotor Activity  Psychomotor Activity:No data recorded  Assets  Assets:Social Support   Sleep  Sleep:Sleep: Fair   Physical Exam: Physical Exam Vitals and nursing note reviewed.  Constitutional:      Appearance: Normal appearance.  HENT:     Head: Normocephalic and atraumatic.     Mouth/Throat:      Pharynx: Oropharynx is clear.  Eyes:     Pupils: Pupils are equal, round, and reactive to light.  Cardiovascular:     Rate and Rhythm: Normal rate and regular rhythm.  Pulmonary:     Effort: Pulmonary effort is normal.     Breath sounds: Normal breath sounds.  Abdominal:     General: Abdomen is flat.     Palpations: Abdomen is soft.  Musculoskeletal:        General: Normal range of motion.  Skin:    General: Skin is warm and dry.  Neurological:     General: No focal deficit present.     Mental Status: He is alert. Mental status is at baseline.  Psychiatric:        Attention and Perception: He is inattentive.        Mood and Affect: Affect is blunt.        Speech: Speech is delayed and slurred.        Behavior: Behavior is withdrawn.        Thought Content: Thought content is delusional.  Cognition and Memory: Cognition is impaired.    Review of Systems  Constitutional: Negative.   HENT: Negative.   Eyes: Negative.   Respiratory: Negative.   Cardiovascular: Negative.   Gastrointestinal: Negative.   Musculoskeletal: Negative.   Skin: Negative.   Neurological: Negative.   Psychiatric/Behavioral: Positive for depression.   Blood pressure 118/90, pulse 84, temperature 97.9 F (36.6 C), temperature source Oral, resp. rate 16, SpO2 98 %. There is no height or weight on file to calculate BMI.  Treatment Plan Summary: Plan No change to medication for today except to add the as needed single dose of Ativan prior to transfer.  Patient has been accepted to [facility].  We have no beds available in our system.  Inpatient continues to be indicated.  Disposition: Recommend psychiatric Inpatient admission when medically cleared.  Mordecai Rasmussen, MD 04/12/2020 2:32 PM

## 2020-04-12 NOTE — ED Notes (Signed)
Pt asleep at this time, unable to collect vitals. Will collect pt vitals once awake. 

## 2020-04-12 NOTE — ED Notes (Signed)
EMTALA reviewed by this RN.  

## 2020-04-12 NOTE — BH Assessment (Signed)
Writer spoke with patient's sister/guardian Morrie Sheldon Knippenberg-[phone]) and updated her about the patient going to [facility].

## 2020-04-12 NOTE — ED Notes (Signed)
Pt given lunch tray.

## 2020-04-12 NOTE — ED Notes (Signed)
Hourly rounding completed at this time, patient currently asleep in room. No complaints, stable, and in no acute distress. Q15 minute rounds and monitoring via Security Cameras to continue. 

## 2020-04-12 NOTE — BH Assessment (Signed)
PATIENT BED AVAILABLE AFTER 9AM ON 04/12/20 PLEASE OBTAIN UDS FROM PATIENT BEFORE TRANSFER AND FAX  RESULTS TO [phone]  Patient has been accepted to [facility].  Patient assigned to [facility] C-Unit Accepting physician is Dr. Sallyanne Kuster.  Call report to [phone].  Representative was [place].   ER Staff is aware of it:  [facility] ER Secretary  Dr. Dolores Frame, ER MD  Thayer Ohm Patient's Nurse     Address: [address], [place] [address]      Attempted to contact patient's legal guardian/sister Shane Nash at [phone] to update legal guardian about acceptance but no answer, a HIPAA compliant voicemail was left to return phone call.

## 2020-04-12 NOTE — BH Assessment (Addendum)
Writer faxed requested information to [facility] ([phone]) and confirmed ti was received. Writer updated ER Secretary Misty Stanley).

## 2020-04-12 NOTE — ED Notes (Signed)
Pt provided with crackers and 1 cup of water. Pt states that he needs "a lot more". Pt educated that he would only be receiving this cup for now as he has history of water intoxication and was trying to drink water out of the sink in the quad.

## 2020-04-12 NOTE — ED Notes (Signed)
Pt to restroom, this nurse approaches pt before he goes into restroom and provides cup. Pt does not shut door, can be heard urinating, pt stands there talking to self for a second, can be heard dumping out what this nurse believes to be urine sample, pt flushes toilet, steps out moments later with clear fluid in urine cup, sees this nurse, steps back into restroom and attempts to drink from urine cup. This nurse grabs cup from pt, pt attempts to step toward toilet, nurse instructs pt to go to room and pt turns around and states he needs more water as he is walking back to room. Unable to collect sample. Restroom door closed and lock at this time due to pt actions.

## 2020-04-12 NOTE — ED Notes (Signed)
IVC/ pending transport to [facility] after 9am

## 2020-04-12 NOTE — ED Notes (Signed)
Pt belonging bags 1/1 given to transport. Pt and legal guardian aware of transfer.

## 2020-04-12 NOTE — ED Notes (Signed)
Breakfast tray given. VS assessed. Shower supplies will be given after pt is done with breakfast.

## 2020-04-12 NOTE — ED Provider Notes (Signed)
Emergency Medicine Observation Re-evaluation Note  Shane Nash is a 40 y.o. male, seen on rounds today.  Pt initially presented to the ED for complaints of IVC Currently, the patient is sleeping comfortably.  Physical Exam  BP 100/62   Pulse 89   Temp 98.3 F (36.8 C) (Oral)   Resp 20   SpO2 100%  Physical Exam Constitutional:      Appearance: He is not ill-appearing or toxic-appearing.  HENT:     Head: Atraumatic.  Cardiovascular:     Comments: Well perfused Pulmonary:     Effort: Pulmonary effort is normal.  Abdominal:     General: There is no distension.  Musculoskeletal:        General: No deformity.  Skin:    Findings: No rash.  Neurological:     General: No focal deficit present.     Cranial Nerves: No cranial nerve deficit.      ED Course / MDM  EKG:  Clinical Course as of 04/12/20 0715  Tue Apr 11, 2020  0303 Sodium within normal limits. The patient has been placed in psychiatric observation due to the need to provide a safe environment for the patient while obtaining psychiatric consultation and evaluation, as well as ongoing medical and medication management to treat the patient's condition. The patient has been placed under full IVC at this time.   [JS]  0540 Patient evaluated by psychiatric NP pending BMU admission. [JS]    Clinical Course User Index [JS] Irean Hong, MD   I have reviewed the labs performed to date as well as medications administered while in observation.  Recent changes in the last 24 hours include pt accepted to [facility].  Plan  [facility] plan [facility]. Patient is under full IVC at this time.   Delton Prairie, MD 04/12/20 [phone]

## 2020-04-12 NOTE — ED Notes (Signed)
Pt is asleep. VS will be assess when pt is awake.  

## 2020-04-12 NOTE — ED Provider Notes (Signed)
-----------------------------------------   12:50 AM on 04/12/2020 -----------------------------------------  Patient has been accepted to [facility] with plans to transport later this morning.   Irean Hong, MD 04/12/20 [phone]

## 2021-01-09 ENCOUNTER — Other Ambulatory Visit: Payer: Self-pay | Admitting: Speech Pathology

## 2021-01-09 ENCOUNTER — Other Ambulatory Visit: Payer: Self-pay | Admitting: Behavioral Health

## 2021-01-09 ENCOUNTER — Other Ambulatory Visit: Payer: Self-pay | Admitting: Student

## 2021-01-09 DIAGNOSIS — G912 (Idiopathic) normal pressure hydrocephalus: Secondary | ICD-10-CM

## 2021-02-01 ENCOUNTER — Other Ambulatory Visit: Payer: Self-pay

## 2021-02-01 ENCOUNTER — Ambulatory Visit
Admission: RE | Admit: 2021-02-01 | Discharge: 2021-02-01 | Disposition: A | Discharge status: Home / Self Care | Payer: Medicaid Other | Source: Ambulatory Visit | Attending: Behavioral Health | Admitting: Behavioral Health

## 2021-02-01 DIAGNOSIS — G912 (Idiopathic) normal pressure hydrocephalus: Secondary | ICD-10-CM

## 2021-09-18 ENCOUNTER — Emergency Department (EMERGENCY_DEPARTMENT_HOSPITAL)
Admission: EM | Admit: 2021-09-18 | Discharge: 2021-09-19 | Disposition: A | Discharge status: Other Acute Inpatient Hospital | Payer: No Typology Code available for payment source | Source: Home / Self Care | Attending: Emergency Medicine | Admitting: Emergency Medicine

## 2021-09-18 ENCOUNTER — Encounter: Payer: Self-pay | Admitting: Emergency Medicine

## 2021-09-18 ENCOUNTER — Other Ambulatory Visit: Payer: Self-pay

## 2021-09-18 DIAGNOSIS — Z20822 Contact with and (suspected) exposure to covid-19: Secondary | ICD-10-CM | POA: Insufficient documentation

## 2021-09-18 DIAGNOSIS — F209 Schizophrenia, unspecified: Secondary | ICD-10-CM | POA: Insufficient documentation

## 2021-09-18 DIAGNOSIS — F29 Unspecified psychosis not due to a substance or known physiological condition: Secondary | ICD-10-CM | POA: Insufficient documentation

## 2021-09-18 DIAGNOSIS — F201 Disorganized schizophrenia: Secondary | ICD-10-CM

## 2021-09-18 LAB — COMPREHENSIVE METABOLIC PANEL
ALT: 16 U/L (ref 0–44)
AST: 22 U/L (ref 15–41)
Albumin: 4.1 g/dL (ref 3.5–5.0)
Alkaline Phosphatase: 108 U/L (ref 38–126)
Anion gap: 10 (ref 5–15)
BUN: 14 mg/dL (ref 6–20)
CO2: 22 mmol/L (ref 22–32)
Calcium: 8.9 mg/dL (ref 8.9–10.3)
Chloride: 104 mmol/L (ref 98–111)
Creatinine, Ser: 1.4 mg/dL — ABNORMAL HIGH (ref 0.61–1.24)
GFR, Estimated: >60 mL/min (ref >60)
Glucose, Bld: 94 mg/dL (ref 70–99)
Potassium: 3.4 mmol/L — ABNORMAL LOW (ref 3.5–5.1)
Sodium: 136 mmol/L (ref 135–145)
Total Bilirubin: 1.3 mg/dL — ABNORMAL HIGH (ref 0.3–1.2)
Total Protein: 8.6 g/dL — ABNORMAL HIGH (ref 6.5–8.1)

## 2021-09-18 LAB — URINE DRUG SCREEN, QUALITATIVE (ARMC ONLY)
Amphetamines, Ur Screen: NOT DETECTED
Barbiturates, Ur Screen: NOT DETECTED
Benzodiazepine, Ur Scrn: NOT DETECTED
Cannabinoid 50 Ng, Ur ~~LOC~~: NOT DETECTED
Cocaine Metabolite,Ur ~~LOC~~: NOT DETECTED
MDMA (Ecstasy)Ur Screen: NOT DETECTED
Methadone Scn, Ur: NOT DETECTED
Opiate, Ur Screen: NOT DETECTED
Phencyclidine (PCP) Ur S: NOT DETECTED
Tricyclic, Ur Screen: NOT DETECTED

## 2021-09-18 LAB — ETHANOL: Alcohol, Ethyl (B): <10 mg/dL (ref <10)

## 2021-09-18 LAB — SARS CORONAVIRUS 2 BY RT PCR: SARS Coronavirus 2 by RT PCR: NEGATIVE

## 2021-09-18 LAB — CBC
HCT: 40.6 % (ref 39.0–52.0)
Hemoglobin: 13.7 g/dL (ref 13.0–17.0)
MCH: 30.3 pg (ref 26.0–34.0)
MCHC: 33.7 g/dL (ref 30.0–36.0)
MCV: 89.8 fL (ref 80.0–100.0)
Platelets: 311 10*3/uL (ref 150–400)
RBC: 4.52 MIL/uL (ref 4.22–5.81)
RDW: 12.8 % (ref 11.5–15.5)
WBC: 6 10*3/uL (ref 4.0–10.5)
nRBC: 0 % (ref 0.0–0.2)

## 2021-09-18 LAB — ACETAMINOPHEN LEVEL: Acetaminophen (Tylenol), Serum: <10 ug/mL — ABNORMAL LOW (ref 10–30)

## 2021-09-18 LAB — SALICYLATE LEVEL: Salicylate Lvl: <7 mg/dL — ABNORMAL LOW (ref 7.0–30.0)

## 2021-09-18 NOTE — BH Assessment (Signed)
Comprehensive Clinical Assessment (CCA) Note  09/18/2021 Shane Nash [id number]  Shane Nash, 41 year old male who presents to [facility] ED involuntarily for treatment. Per triage note, Pt via BPD under IVC. Per IVC paperwork, pt has hx of schizophrenia and per paperwork. Pt has not been taking medications as prescribed. Pt is not eating or drink. States that he has been eating his own feces and and drinking from the toilet. Pt is disheveled and per paperwork he hasn't bathe in weeks. Pt denies any pain. Denies ETOH/substance use. Denies SI/HI. Pt is calm and cooperative during triage.   During TTS assessment pt presents alert and oriented x 4, restless but cooperative, and mood-congruent with affect. The pt does not appear to be responding to internal or external stimuli. Neither is the pt presenting with any delusional thinking. Pt verified the information provided to triage RN.   Upon evaluation, patient is calm and cooperative. Patient reports he does not want to stay in the ED and admits that he is non-compliant with his medication. "I haven't been taking my pills." Patient reports a therapist comes to see him every Tuesday. Patient lives with his sister, whom he says called the police. Patient appears confused and is not sure why he was brought to the ED. Pt denies current SI/HI/AH/VH.    Per Sallye Ober, NP, pt is recommended for inpatient psychiatric admission.    Chief Complaint:  Chief Complaint  Patient presents with   Psychiatric Evaluation   Visit Diagnosis: Schizophrenia    CCA Screening, Triage and Referral (STR)  Patient Reported Information How did you hear about [place]? -- Mudlogger)  Referral name: No data recorded Referral phone number: No data recorded  Whom do you see for routine medical problems? No data recorded Practice/Facility Name: No data recorded Practice/Facility Phone Number: No data recorded Name of Contact: No data recorded Contact Number: No data  recorded Contact Fax Number: No data recorded Prescriber Name: No data recorded Prescriber Address (if known): No data recorded  What Is the Reason for Your Visit/Call Today? Patient has been non-compliant with his medication.  How Long Has This Been Causing You Problems? > than 6 months  What Do You Feel Would Help You the Most Today? Medication(s)   Have You Recently Been in Any Inpatient Treatment (Hospital/Detox/Crisis Center/28-Day Program)? No data recorded Name/Location of Program/Hospital:No data recorded How Long Were You There? No data recorded When Were You Discharged? No data recorded  Have You Ever Received Services From [facility] Before? No data recorded Who Do You See at [facility]? No data recorded  Have You Recently Had Any Thoughts About Hurting Yourself? No  Are You Planning to Commit Suicide/Harm Yourself At This time? No   Have you Recently Had Thoughts About Hurting Someone Shane Nash? No  Explanation: No data recorded  Have You Used Any Alcohol or Drugs in the Past 24 Hours? No  How Long Ago Did You Use Drugs or Alcohol? No data recorded What Did You Use and How Much? No data recorded  Do You Currently Have a Therapist/Psychiatrist? Yes  Name of Therapist/Psychiatrist: ACT Team   Have You Been Recently Discharged From Any Office Practice or Programs? No  Explanation of Discharge From Practice/Program: No data recorded    CCA Screening Triage Referral Assessment Type of Contact: Face-to-Face  Is this Initial or Reassessment? No data recorded Date Telepsych consult ordered in [facility]:  No data recorded Time Telepsych consult ordered in [facility]:  No data recorded  Patient Reported Information Reviewed? No data recorded Patient Left Without Being Seen? No data recorded Reason for Not Completing Assessment: No data recorded  Collateral Involvement: ACT team   Does Patient Have a Court Appointed Legal Guardian? No data recorded Name and Contact of  Legal Guardian: No data recorded If Minor and Not Living with Parent(s), Who has Custody? No data recorded Is CPS involved or ever been involved? No data recorded Is APS involved or ever been involved? No data recorded  Patient Determined To Be At Risk for Harm To Self or Others Based on Review of Patient Reported Information or Presenting Complaint? No  Method: No data recorded Availability of Means: No data recorded Intent: No data recorded Notification Required: No data recorded Additional Information for Danger to Others Potential: No data recorded Additional Comments for Danger to Others Potential: No data recorded Are There Guns or Other Weapons in Your Home? No data recorded Types of Guns/Weapons: No data recorded Are These Weapons Safely Secured?                            No data recorded Who Could Verify You Are Able To Have These Secured: No data recorded Do You Have any Outstanding Charges, Pending Court Dates, Parole/Probation? No data recorded Contacted To Inform of Risk of Harm To Self or Others: No data recorded  Location of Assessment: [facility] ED   Does Patient Present under Involuntary Commitment? Yes  IVC Papers Initial File Date: 09/18/21   [place] of Residence: [place]   Patient Currently Receiving the Following Services: ACTT Engineer, agricultural Treatment); Medication Management   Determination of Need: Emergent (2 hours)   Options For Referral: ED Visit; Inpatient Hospitalization; Medication Management; Outpatient Therapy     CCA Biopsychosocial Intake/Chief Complaint:  No data recorded Current Symptoms/Problems: No data recorded  Patient Reported Schizophrenia/Schizoaffective Diagnosis in Past: Yes   Strengths: Patient able to communicate and verbalize needs.  Preferences: No data recorded Abilities: No data recorded  Type of Services Patient Feels are Needed: No data recorded  Initial Clinical Notes/Concerns: No data recorded  Mental  Health Symptoms Depression:   None   Duration of Depressive symptoms: No data recorded  Mania:   Change in energy/activity   Anxiety:    Difficulty concentrating   Psychosis:   Delusions; Grossly disorganized or catatonic behavior; Grossly disorganized speech   Duration of Psychotic symptoms:  Less than six months   Trauma:   None   Obsessions:   None   Compulsions:   Absent insight/delusional   Inattention:   None   Hyperactivity/Impulsivity:   N/A   Oppositional/Defiant Behaviors:   None   Emotional Irregularity:   None   Other Mood/Personality Symptoms:  No data recorded   Mental Status Exam Appearance and self-care  Stature:   Average   Weight:   Average weight   Clothing:   Disheveled   Grooming:   Neglected   Cosmetic use:   None   Posture/gait:   Slumped   Motor activity:   Not Remarkable   Sensorium  Attention:   Confused   Concentration:   Preoccupied   Orientation:   Person; Place   Recall/memory:   Defective in Short-term   Affect and Mood  Affect:   Flat; Anxious   Mood:   Depressed; Anxious   Relating  Eye contact:   Normal   Facial expression:   Constricted   Attitude toward examiner:  Guarded; Cooperative   Thought and Language  Speech flow:  Articulation error; Garbled   Thought content:   Appropriate to Mood and Circumstances   Preoccupation:   None   Hallucinations:   None   Organization:  No data recorded  [employer] of Knowledge:   Fair   Intelligence:   Below average   Abstraction:   Functional   Judgement:   Poor   Reality Testing:   Distorted   Insight:   Lacking; None/zero insight; Poor   Decision Making:   Impulsive   Social Functioning  Social Maturity:   Isolates   Social Judgement:   Heedless   Stress  Stressors:   Illness   Coping Ability:   Exhausted   Skill Deficits:   Activities of daily living; Communication; Decision making;  Self-care   Supports:   Family     Religion:    Leisure/Recreation:    Exercise/Diet: Exercise/Diet Do You Have Any Trouble Sleeping?: No   CCA Employment/Education Employment/Work Situation: Employment / Work Situation Employment Situation: On disability Why is Patient on Disability: Mental Health How Long has Patient Been on Disability: Unknown  Education:     CCA Family/Childhood History Family and Relationship History: Family history Marital status: Single Does patient have children?: No  Childhood History:     Child/Adolescent Assessment:     CCA Substance Use Alcohol/Drug Use: Alcohol / Drug Use Pain Medications: See MAR Prescriptions: See MAR Over the Counter: See MAR History of alcohol / drug use?: No history of alcohol / drug abuse                         ASAM's:  Six Dimensions of Multidimensional Assessment  Dimension 1:  Acute Intoxication and/or Withdrawal Potential:      Dimension 2:  Biomedical Conditions and Complications:      Dimension 3:  Emotional, Behavioral, or Cognitive Conditions and Complications:     Dimension 4:  Readiness to Change:     Dimension 5:  Relapse, Continued use, or Continued Problem Potential:     Dimension 6:  Recovery/Living Environment:     ASAM Severity Score:    ASAM Recommended Level of Treatment:     Substance use Disorder (SUD)    Recommendations for Services/Supports/Treatments:    DSM5 Diagnoses: Patient Active Problem List   Diagnosis Date Noted   Hypoglycemia 02/23/2020   Tardive dyskinesia 05/18/2019   Tobacco use disorder 03/26/2018   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

## 2021-09-18 NOTE — ED Notes (Signed)
Hester,Shelba Mother [phone]   Contacted to inquire about pt legal guardian. No answer, unable to leave VM.

## 2021-09-18 NOTE — ED Notes (Signed)
Meal given

## 2021-09-18 NOTE — Consult Note (Signed)
[facility] Face-to-Face Psychiatry Consult   Reason for Consult: Medication noncompliance/psychosis Referring Physician: Juliette Alcide Patient Identification: Shane Nash MRN:  [id number] Principal Diagnosis: <principal problem not specified> Diagnosis:  Active Problems:   * No active hospital problems. *   Total Time spent with patient: 30 minutes  Subjective:   Shane Nash is a 41 y.o. male patient admitted with medication noncompliance, patient has been eating his own feces and drinking in the toilet.  HPI: Patient has history of schizophrenia.  He was brought to the ED by police under IVC.  Patient has been eating his own feces and drinking from the toilet.  He has not been taking his medications per report. On evaluation, patient is calm and cooperative.  He appears very confused.  He states that he is not having any hallucinations, although he is slowly looking around the room and it appears as though he may be hearing or seeing things that are not there.  He denies suicidal or homicidal ideations.  He may be experiencing some paranoia, although he does not admit to this.  Patient will need psychiatric inpatient admission for stabilization and treatment.  Past Psychiatric History: Schizophrenia  Risk to Self:   Risk to Others:   Prior Inpatient Therapy:   Prior Outpatient Therapy:    Past Medical History:  Past Medical History:  Diagnosis Date   Schizophrenia (HCC)    History reviewed. No pertinent surgical history. Family History: History reviewed. No pertinent family history. Family Psychiatric  History:  Social History:  Social History   Substance and Sexual Activity  Alcohol Use Not Currently     Social History   Substance and Sexual Activity  Drug Use No    Social History   Socioeconomic History   Marital status: Single    Spouse name: Not on file   Number of children: Not on file   Years of education: Not on file   Highest education level: Not on file   Occupational History   Not on file  Tobacco Use   Smoking status: Every Day    Packs/day: 0.25    Years: 15.00    Total pack years: 3.75    Types: Cigarettes   Smokeless tobacco: Never   Tobacco comments:    Cannot state the amount he smokes per day  Vaping Use   Vaping Use: Never used  Substance and Sexual Activity   Alcohol use: Not Currently   Drug use: No   Sexual activity: Not Currently  Other Topics Concern   Not on file  Social History Narrative   Not on file   Social Determinants of Health   Financial Resource Strain: Not on file  Food Insecurity: Not on file  Transportation Needs: Not on file  Physical Activity: Not on file  Stress: Not on file  Social Connections: Not on file   Additional Social History:    Allergies:  No Known Allergies  Labs:  Results for orders placed or performed during the hospital encounter of 09/18/21 (from the past 48 hour(s))  Urine Drug Screen, Qualitative     Status: None   Collection Time: 09/18/21  1:44 PM  Result Value Ref Range   Tricyclic, Ur Screen NONE DETECTED NONE DETECTED   Amphetamines, Ur Screen NONE DETECTED NONE DETECTED   MDMA (Ecstasy)Ur Screen NONE DETECTED NONE DETECTED   Cocaine Metabolite,Ur Echo NONE DETECTED NONE DETECTED   Opiate, Ur Screen NONE DETECTED NONE DETECTED   Phencyclidine (PCP) Ur S NONE DETECTED  NONE DETECTED   Cannabinoid 50 Ng, Ur [place] NONE DETECTED NONE DETECTED   Barbiturates, Ur Screen NONE DETECTED NONE DETECTED   Benzodiazepine, Ur Scrn NONE DETECTED NONE DETECTED   Methadone Scn, Ur NONE DETECTED NONE DETECTED    Comment: (NOTE) Tricyclics + metabolites, urine    Cutoff 1000 ng/mL Amphetamines + metabolites, urine  Cutoff 1000 ng/mL MDMA (Ecstasy), urine              Cutoff 500 ng/mL Cocaine Metabolite, urine          Cutoff 300 ng/mL Opiate + metabolites, urine        Cutoff 300 ng/mL Phencyclidine (PCP), urine         Cutoff 25 ng/mL Cannabinoid, urine                 Cutoff 50  ng/mL Barbiturates + metabolites, urine  Cutoff 200 ng/mL Benzodiazepine, urine              Cutoff 200 ng/mL Methadone, urine                   Cutoff 300 ng/mL  The urine drug screen provides only a preliminary, unconfirmed analytical test result and should not be used for non-medical purposes. Clinical consideration and professional judgment should be applied to any positive drug screen result due to possible interfering substances. A more specific alternate chemical method must be used in order to obtain a confirmed analytical result. Gas chromatography / mass spectrometry (GC/MS) is the preferred confirm atory method. Performed at [facility], [address] [place], [place] [address]   Comprehensive metabolic panel     Status: Abnormal   Collection Time: 09/18/21  1:45 PM  Result Value Ref Range   Sodium 136 135 - 145 mmol/L   Potassium 3.4 (L) 3.5 - 5.1 mmol/L   Chloride 104 98 - 111 mmol/L   CO2 22 22 - 32 mmol/L   Glucose, Bld 94 70 - 99 mg/dL    Comment: Glucose reference range applies only to samples taken after fasting for at least 8 hours.   BUN 14 6 - 20 mg/dL   Creatinine, Ser 5.88 (H) 0.61 - 1.24 mg/dL   Calcium 8.9 8.9 - 50.2 mg/dL   Total Protein 8.6 (H) 6.5 - 8.1 g/dL   Albumin 4.1 3.5 - 5.0 g/dL   AST 22 15 - 41 U/L   ALT 16 0 - 44 U/L   Alkaline Phosphatase 108 38 - 126 U/L   Total Bilirubin 1.3 (H) 0.3 - 1.2 mg/dL   GFR, Estimated >77 >41 mL/min    Comment: (NOTE) Calculated using the CKD-EPI Creatinine Equation (2021)    Anion gap 10 5 - 15    Comment: Performed at [facility], [address] [place], [place] [address]  Ethanol     Status: None   Collection Time: 09/18/21  1:45 PM  Result Value Ref Range   Alcohol, Ethyl (B) <10 <10 mg/dL    Comment: (NOTE) Lowest detectable limit for serum alcohol is 10 mg/dL.  For medical purposes only. Performed at [facility], [address] [place], [place] [address]    Salicylate level     Status: Abnormal   Collection Time: 09/18/21  1:45 PM  Result Value Ref Range   Salicylate Lvl <7.0 (L) 7.0 - 30.0 mg/dL    Comment: Performed at [facility], [address], [place], [place] [address]  Acetaminophen level  Status: Abnormal   Collection Time: 09/18/21  1:45 PM  Result Value Ref Range   Acetaminophen (Tylenol), Serum <10 (L) 10 - 30 ug/mL    Comment: (NOTE) Therapeutic concentrations vary significantly. A range of 10-30 ug/mL  may be an effective concentration for many patients. However, some  are best treated at concentrations outside of this range. Acetaminophen concentrations >150 ug/mL at 4 hours after ingestion  and >50 ug/mL at 12 hours after ingestion are often associated with  toxic reactions.  Performed at [facility], [address] [place], [place] [address]   cbc     Status: None   Collection Time: 09/18/21  1:45 PM  Result Value Ref Range   WBC 6.0 4.0 - 10.5 K/uL   RBC 4.52 4.22 - 5.81 MIL/uL   Hemoglobin 13.7 13.0 - 17.0 g/dL   HCT 62.9 52.8 - 41.3 %   MCV 89.8 80.0 - 100.0 fL   MCH 30.3 26.0 - 34.0 pg   MCHC 33.7 30.0 - 36.0 g/dL   RDW 24.4 01.0 - 27.2 %   Platelets 311 150 - 400 K/uL   nRBC 0.0 0.0 - 0.2 %    Comment: Performed at [facility], [address] [place], [place] [address]  SARS Coronavirus 2 by RT PCR (hospital order, performed in [facility] hospital lab) *cepheid single result test* Anterior Nasal Swab     Status: None   Collection Time: 09/18/21  2:05 PM   Specimen: Anterior Nasal Swab  Result Value Ref Range   SARS Coronavirus 2 by RT PCR NEGATIVE NEGATIVE    Comment: (NOTE) SARS-CoV-2 target nucleic acids are NOT DETECTED.  The SARS-CoV-2 RNA is generally detectable in upper and lower respiratory specimens during the acute phase of infection. The lowest concentration of SARS-CoV-2 viral copies this assay can detect is 250 copies / mL. A negative result does not  preclude SARS-CoV-2 infection and should not be used as the sole basis for treatment or other patient management decisions.  A negative result may occur with improper specimen collection / handling, submission of specimen other than nasopharyngeal swab, presence of viral mutation(s) within the areas targeted by this assay, and inadequate number of viral copies (<250 copies / mL). A negative result must be combined with clinical observations, patient history, and epidemiological information.  Fact Sheet for Patients:   RoadLapTop.co.za  Fact Sheet for Healthcare Providers: http://kim-miller.com/  This test is not yet approved or  cleared by the [place] FDA and has been authorized for detection and/or diagnosis of SARS-CoV-2 by FDA under an Emergency Use Authorization (EUA).  This EUA will remain in effect (meaning this test can be used) for the duration of the COVID-19 declaration under Section 564(b)(1) of the Act, 21 U.S.C. section 360bbb-3(b)(1), unless the authorization is terminated or revoked sooner.  Performed at [facility], [address] [place], [place] [address]     No current facility-administered medications for this encounter.   Current Outpatient Medications  Medication Sig Dispense Refill   divalproex (DEPAKOTE ER) 500 MG 24 hr tablet Take 1 tablet (500 mg total) by mouth at bedtime. (Patient not taking: Reported on 09/18/2021) 30 tablet 1   paliperidone (INVEGA) 9 MG 24 hr tablet Take 1 tablet (9 mg total) by mouth daily. (Patient not taking: Reported on 09/18/2021) 30 tablet 1    Musculoskeletal: Strength & Muscle Tone: within normal limits Gait & Station: normal Patient leans: N/A   Psychiatric Specialty Exam:  Presentation  General Appearance: Bizarre  Eye Contact:Fleeting  Speech:Garbled (poor dentition)  Speech Volume:Normal  Handedness:No data recorded  Mood and Affect   Mood:Dysphoric  Affect:Blunt   Thought Process  Thought Processes:Disorganized  Descriptions of Associations:Loose  Orientation:Partial  Thought Content:Illogical  History of Schizophrenia/Schizoaffective disorder:No data recorded Duration of Psychotic Symptoms:No data recorded Hallucinations:Hallucinations: -- (Denies, may be rtis)  Ideas of Reference:No data recorded Suicidal Thoughts:Suicidal Thoughts: No  Homicidal Thoughts:Homicidal Thoughts: No   Sensorium  Memory:Immediate Poor  Judgment:Impaired  Insight:Lacking   Executive Functions  Concentration:Poor  Attention Span:Poor  Recall:Poor  Fund of Knowledge:Poor  Language:Poor   Psychomotor Activity  Psychomotor Activity:Psychomotor Activity: Normal   Assets  Assets:Financial Resources/Insurance; Housing; Resilience   Sleep  Sleep:No data recorded  Physical Exam: Physical Exam Vitals and nursing note reviewed.  HENT:     Head: Normocephalic.     Nose: No congestion or rhinorrhea.  Eyes:     General:        Right eye: No discharge.        Left eye: No discharge.  Cardiovascular:     Rate and Rhythm: Normal rate.  Pulmonary:     Effort: Pulmonary effort is normal.  Musculoskeletal:        General: Normal range of motion.     Cervical back: Normal range of motion.  Skin:    General: Skin is dry.  Neurological:     Mental Status: He is alert.  Psychiatric:        Attention and Perception: He is inattentive.        Mood and Affect: Affect is blunt, flat and inappropriate.        Speech: Speech is slurred (POOR DENTITION).        Behavior: Behavior is cooperative.        Thought Content: Thought content does not include homicidal or suicidal ideation.        Cognition and Memory: Cognition is impaired.        Judgment: Judgment is impulsive.    Review of Systems  Reason unable to perform ROS: Patient not c/o any pain or issues. unable to fully participate.  Constitutional:  Negative.   Respiratory: Negative.    Psychiatric/Behavioral:         Schizophrenia   Blood pressure 122/87, pulse 92, temperature 98.5 F (36.9 C), resp. rate 20, height 5\' 11"  (1.803 m), weight 89.8 kg, SpO2 94 %. Body mass index is 27.62 kg/m.  Treatment Plan Summary: Daily contact with patient to assess and evaluate symptoms and progress in treatment, Medication management, and Plan pharmacy to reconcile medications.  To be admitted to inpatient psychiatry.  Reviewed with EDP  Disposition: Recommend psychiatric Inpatient admission when medically cleared.  , NP 09/18/2021 3:27 PM

## 2021-09-18 NOTE — ED Notes (Signed)
Hospital meal provided, pt tolerated w/o complaints.  Waste discarded appropriately.  

## 2021-09-18 NOTE — ED Triage Notes (Signed)
Pt via BPD under IVC. Per IVC paperwork, pt has hx of schizophrenia and per paperwork. Pt has not been taking medications as prescribed. Pt is not eating or drink. States that he has been eating his own feces and and drinking from the toilet. Pt is disheveled and per paperwork he hasn't bathe in weeks. Pt denies any pain. Denies ETOH/substance use. Denies SI/HI. Pt is calm and cooperative during triage.

## 2021-09-18 NOTE — ED Notes (Signed)
IVC PENDING  CONSULT ?

## 2021-09-18 NOTE — ED Notes (Signed)
Pt belongings include:  1 pair of blue shoes  1 pair of navy blue pants  1 blue shirt

## 2021-09-18 NOTE — ED Provider Notes (Signed)
   [facility] ([facility]) Provider Note    None    (approximate)   History   Psychiatric Evaluation   HPI  Shane Nash is a 41 y.o. male who comes from a private doctor's office by [employer].  He is reportedly not taking his medicines appropriately.  He is responding to internal stimuli here in the ER.  He is not eating much and reportedly eating his own stool and drinking from the toilet.  This is per family members.  He has not bathed in some time.      Physical Exam   Triage Vital Signs: ED Triage Vitals  Enc Vitals Group     BP 09/18/21 1344 122/87     Pulse Rate 09/18/21 1344 92     Resp 09/18/21 1344 20     Temp 09/18/21 1344 98.5 F (36.9 C)     Temp src --      SpO2 09/18/21 1344 94 %     Weight 09/18/21 1343 198 lb (89.8 kg)     Height 09/18/21 1343 5\' 11"  (1.803 m)     Head Circumference --      Peak Flow --      Pain Score 09/18/21 1342 0     Pain Loc --      Pain Edu? --      Excl. in GC? --     Most recent vital signs: Vitals:   09/18/21 1344  BP: 122/87  Pulse: 92  Resp: 20  Temp: 98.5 F (36.9 C)  SpO2: 94%     General: Awake, no distress.  He does appear to be responding to internal stimuli.  Patient does smell as if she has not been bathing regularly and has been out in the hot sun for quite some time. CV:  Good peripheral perfusion.  Heart regular rate and rhythm no audible murmurs Resp:  Normal effort.  Lungs are clear Abd:  No distention.  Soft and nontender Extremities: No edema   ED Results / Procedures / Treatments   Labs (all labs ordered are listed, but only abnormal results are displayed) Labs Reviewed  SARS CORONAVIRUS 2 BY RT PCR  CBC  COMPREHENSIVE METABOLIC PANEL  ETHANOL  SALICYLATE LEVEL  ACETAMINOPHEN LEVEL  URINE DRUG SCREEN, QUALITATIVE ([facility] ONLY)     EKG     RADIOLOGY   PROCEDURES:  Critical Care performed:   Procedures   MEDICATIONS ORDERED IN ED: Medications - No data to  display   IMPRESSION / MDM / ASSESSMENT AND PLAN / ED COURSE  I reviewed the triage vital signs and the nursing notes. We will get labs back on him make sure he is not dehydrated or anything else and then have psychiatry see him Differential diagnosis includes, but is not limited to, chronic drug abuse or more likely psychosis and not compliant with his medicines  Patient's presentation is most consistent with acute presentation with potential threat to life or bodily function.     FINAL CLINICAL IMPRESSION(S) / ED DIAGNOSES   Final diagnoses:  Psychosis, unspecified psychosis type (HCC)     Rx / DC Orders   ED Discharge Orders     None        Note:  This document was prepared using Dragon voice recognition software and may include unintentional dictation errors.   09/20/21, MD 09/18/21 [phone]

## 2021-09-18 NOTE — ED Notes (Signed)
Patient transferred from ED to [facility] room 4 after screening for contraband. Report received from Selena Batten, RN including Situation, Background, Assessment and Recommendations. Pt oriented to unit including Q15 minute rounds as well as the security cameras for their protection. Patient is alert and oriented, warm and dry in no acute distress. Patient denies SI, HI, and AVH. Pt. Encouraged to let this nurse know if needs arise.

## 2021-09-19 ENCOUNTER — Encounter: Payer: Self-pay | Admitting: Psychiatry

## 2021-09-19 ENCOUNTER — Inpatient Hospital Stay
Admission: AD | Admit: 2021-09-19 | Discharge: 2021-09-24 | DRG: 885 | Disposition: A | Discharge status: Home / Self Care | Payer: No Typology Code available for payment source | Source: Ambulatory Visit | Attending: Psychiatry | Admitting: Psychiatry

## 2021-09-19 ENCOUNTER — Other Ambulatory Visit: Payer: Self-pay

## 2021-09-19 DIAGNOSIS — Z20822 Contact with and (suspected) exposure to covid-19: Secondary | ICD-10-CM | POA: Diagnosis present

## 2021-09-19 DIAGNOSIS — F209 Schizophrenia, unspecified: Secondary | ICD-10-CM | POA: Diagnosis present

## 2021-09-19 DIAGNOSIS — Z91148 Patient's other noncompliance with medication regimen for other reason: Secondary | ICD-10-CM | POA: Diagnosis not present

## 2021-09-19 DIAGNOSIS — F17210 Nicotine dependence, cigarettes, uncomplicated: Secondary | ICD-10-CM | POA: Diagnosis present

## 2021-09-19 DIAGNOSIS — F201 Disorganized schizophrenia: Secondary | ICD-10-CM | POA: Diagnosis present

## 2021-09-19 DIAGNOSIS — G4700 Insomnia, unspecified: Secondary | ICD-10-CM | POA: Diagnosis present

## 2021-09-19 DIAGNOSIS — G2401 Drug induced subacute dyskinesia: Secondary | ICD-10-CM | POA: Diagnosis present

## 2021-09-19 DIAGNOSIS — F203 Undifferentiated schizophrenia: Secondary | ICD-10-CM | POA: Diagnosis not present

## 2021-09-19 MED ORDER — PALIPERIDONE PALMITATE ER 234 MG/1.5ML IM SUSY
234.0000 mg | PREFILLED_SYRINGE | Freq: Once | INTRAMUSCULAR | Status: AC
Start: 2021-09-19 — End: 2021-09-19
  Administered 2021-09-19: 234 mg via INTRAMUSCULAR
  Filled 2021-09-19: qty 1.5

## 2021-09-19 MED ORDER — ACETAMINOPHEN 325 MG PO TABS: 650.0000 mg | ORAL_TABLET | Freq: Four times a day (QID) | ORAL | Status: DC | PRN

## 2021-09-19 MED ORDER — ALUM & MAG HYDROXIDE-SIMETH 200-200-20 MG/5ML PO SUSP: 30.0000 mL | ORAL | Status: DC | PRN

## 2021-09-19 MED ORDER — PALIPERIDONE ER 3 MG PO TB24
6.0000 mg | ORAL_TABLET | Freq: Every day | ORAL | Status: DC
  Administered 2021-09-20: 6 mg via ORAL
  Filled 2021-09-19: qty 2

## 2021-09-19 MED ORDER — MAGNESIUM HYDROXIDE 400 MG/5ML PO SUSP: 30.0000 mL | Freq: Every day | ORAL | Status: DC | PRN

## 2021-09-19 MED ORDER — HYDROXYZINE HCL 25 MG PO TABS
25.0000 mg | ORAL_TABLET | Freq: Three times a day (TID) | ORAL | Status: DC | PRN
  Administered 2021-09-21: 25 mg via ORAL
  Filled 2021-09-19 (×2): qty 1

## 2021-09-19 MED ORDER — PALIPERIDONE ER 3 MG PO TB24
6.0000 mg | ORAL_TABLET | Freq: Every day | ORAL | Status: DC
  Administered 2021-09-19: 6 mg via ORAL
  Filled 2021-09-19: qty 2

## 2021-09-19 MED ORDER — HYDROXYZINE HCL 25 MG PO TABS
25.0000 mg | ORAL_TABLET | Freq: Three times a day (TID) | ORAL | Status: DC | PRN
  Administered 2021-09-19: 25 mg via ORAL
  Filled 2021-09-19: qty 1

## 2021-09-19 NOTE — Tx Team (Signed)
Initial Treatment Plan 09/19/2021 3:31 PM [name] [id number]    PATIENT STRESSORS: Medication change or noncompliance     PATIENT STRENGTHS: Supportive family/friends    PATIENT IDENTIFIED PROBLEMS: Taking medication                     DISCHARGE CRITERIA:  Improved stabilization in mood, thinking, and/or behavior  PRELIMINARY DISCHARGE PLAN: Return to previous living arrangement  PATIENT/FAMILY INVOLVEMENT: This treatment plan has been presented to and reviewed with the patient, Shane Nash. The patient and family have been given the opportunity to ask questions and make suggestions.  Hyman Hopes, RN 09/19/2021, 3:31 PM

## 2021-09-19 NOTE — Progress Notes (Signed)
Patient ID: Shane Nash, male   DOB: [birth year], 41 y.o.   MRN: [id number] Admission note: Patient is a 41 year old male, presents IVC per report of medication noncompliance and eating  his own feces and drinking from the toilet. Patient is alert and oriented to unit.Patients affect is preoccupied. Patient is cooperative during assessment questions. Patient denies stressors related to admission at this time.  Patient currently denies SI/HI/AVH. Patient states that he does not have any goals while on the unit and would just like to leave and go smoke. Unit policies explained and verbalized understanding. Q15 minute checks maintained and will continue to monitor.

## 2021-09-19 NOTE — Progress Notes (Signed)
Pt minimal with staff and tonight and isolative to his room. Pt didn't have any medication scheduled tonight and hasn't requested anything PRN. Pt denies SI/HI/AVH with this Clinical research associate but presents as responding to internal stimuli. Pt given education, support, and encouragement to be active in his treatment plan. Pt being monitored Q 15 minutes for safety per unit protocol, remains safe on the unit.

## 2021-09-19 NOTE — ED Notes (Signed)
IVC/Rec.Inpt. Admit 

## 2021-09-19 NOTE — ED Notes (Signed)
Report was called to [facility] RN, Patient's belongings taken with Patient , no signs of distress.

## 2021-09-19 NOTE — ED Notes (Signed)
Patient had to be coaxed to take His IM INVEGA and pills, but was convinced that it would help him, He is fixated on a cigarette, nurse let him know that she could get a nicotine patch or gum ordered, but He said " No" I just need a cigarette. Patient did lay back down, will continue to monitor, no behavioral actions noted at this time, camera surveillance in progress and 15 minute checks for safety.

## 2021-09-19 NOTE — [facility] Group Notes (Signed)
[facility] Group Notes:  (Nursing/MHT/Case Management/Adjunct)  Date:  09/19/2021  Time:  10:51 PM  Type of Therapy:   Wrap up  Participation Level:  Did Not Attend   Summary of Progress/Problems:  Shane Nash 09/19/2021, 10:51 PM

## 2021-09-20 DIAGNOSIS — F203 Undifferentiated schizophrenia: Secondary | ICD-10-CM

## 2021-09-20 LAB — LIPID PANEL
Cholesterol: 213 mg/dL — ABNORMAL HIGH (ref 0–200)
HDL: 39 mg/dL — ABNORMAL LOW (ref >40)
LDL Cholesterol: 139 mg/dL — ABNORMAL HIGH (ref 0–99)
Total CHOL/HDL Ratio: 5.5 RATIO
Triglycerides: 174 mg/dL — ABNORMAL HIGH (ref <150)
VLDL: 35 mg/dL (ref 0–40)

## 2021-09-20 MED ORDER — DIVALPROEX SODIUM 500 MG PO DR TAB: 500.0000 mg | DELAYED_RELEASE_TABLET | Freq: Two times a day (BID) | ORAL | Status: DC

## 2021-09-20 MED ORDER — OLANZAPINE 10 MG PO TABS
10.0000 mg | ORAL_TABLET | Freq: Every day | ORAL | Status: DC
  Administered 2021-09-20 – 2021-09-23 (×4): 10 mg via ORAL
  Filled 2021-09-20 (×4): qty 1

## 2021-09-20 NOTE — H&P (Signed)
Psychiatric Admission Assessment Adult  Patient Identification: Shane Nash MRN:  [id number] Date of Evaluation:  09/20/2021 Chief Complaint:  Schizophrenia ([place] [address]) [F20.9] Principal Diagnosis: Schizophrenia ([place]) Diagnosis:  Principal Problem:   Schizophrenia ([place]) Active Problems:   Tardive dyskinesia  History of Present Illness: Patient seen and chart reviewed.  41 year old man known to our service who has a history of schizophrenia.  He was brought to the hospital after family became concerned that his mental status had declined rapidly.  History obtained from the chart from the patient and from speaking with his sister on the telephone.  It is reported that over a short period of time he became more disorganized and bizarre in his behavior.  He was playing in urine and eating feces which is very abnormal for him and became disorganized worse than usual in his thinking.  Sister was not aware of any specific triggers saying that as far as she knew he was taking his medicine and had gotten a shot this month.  On interview the patient had no recollection of these symptoms and was not able to discuss symptoms very clearly although he did deny having any suicidal thoughts and denied any active hallucinations.  He said that he just needed to be here to get back on his medicine.  He has not displayed any dangerous aggressive or violent behavior.  Information obtained from the ACT team contradicts what his sister says.  They report that he is due for his Invega shot and did not get it this month.  Sister reports that he is taking olanzapine and in grays at home Associated Signs/Symptoms: Depression Symptoms:  insomnia, psychomotor agitation, difficulty concentrating, Duration of Depression Symptoms: No data recorded (Hypo) Manic Symptoms:  Impulsivity, Anxiety Symptoms:  Excessive Worry, Psychotic Symptoms:  Paranoia, PTSD Symptoms: Negative Total Time spent with patient: 45 minutes  Past  Psychiatric History: Patient has a long established history of schizophrenia and has been on medications including long-acting injectables for a long time.  He has had previous hospitalizations for similar decompensations.  Last time he was here at our hospital appears to have been discharged on long-acting Invega and Depakote.  So far the information I am getting suggest that he is on his long-acting Invega and also may all be on olanzapine and a grazer.  Did not get any report of him being on Depakote currently.  No past suicide attempts.  No history of significant substance abuse  Is the patient at risk to self? Yes.    Has the patient been a risk to self in the past 6 months? Yes.    Has the patient been a risk to self within the distant past? Yes.    Is the patient a risk to others? No.  Has the patient been a risk to others in the past 6 months? No.  Has the patient been a risk to others within the distant past? No.   [place] Scale:  [place] Admission (Current) from 09/19/2021 in [facility] ED from 09/18/2021 in [place] ED from 04/10/2020 in [place] [facility] No Risk No Risk No Risk        Prior Inpatient Therapy:   Prior Outpatient Therapy:    Alcohol Screening: Patient refused Alcohol Screening Tool: Yes 1. How often do you have a drink containing alcohol?: Never 2. How many drinks containing alcohol do you have on a typical day when you  are drinking?: 1 or 2 3. How often do you have six or more drinks on one occasion?: Never AUDIT-C Score: 0 4. How often during the last year have you found that you were not able to stop drinking once you had started?: Never 5. How often during the last year have you failed to do what was normally expected from you because of drinking?: Never 6. How often during the last year have you needed a first drink in the morning  to get yourself going after a heavy drinking session?: Never 7. How often during the last year have you had a feeling of guilt of remorse after drinking?: Never 8. How often during the last year have you been unable to remember what happened the night before because you had been drinking?: Never 9. Have you or someone else been injured as a result of your drinking?: No 10. Has a relative or friend or a doctor or another health worker been concerned about your drinking or suggested you cut down?: No Alcohol Use Disorder Identification Test Final Score (AUDIT): 0 Substance Abuse History in the last 12 months:  No. Consequences of Substance Abuse: Negative Previous Psychotropic Medications: Yes  Psychological Evaluations: Yes  Past Medical History:  Past Medical History:  Diagnosis Date   Schizophrenia (HCC)    History reviewed. No pertinent surgical history. Family History: History reviewed. No pertinent family history. Family Psychiatric  History: See previous.  No information. Tobacco Screening:   Social History:  Social History   Substance and Sexual Activity  Alcohol Use Not Currently     Social History   Substance and Sexual Activity  Drug Use No    Additional Social History:                           Allergies:  No Known Allergies Lab Results:  Results for orders placed or performed during the hospital encounter of 09/18/21 (from the past 48 hour(s))  Urine Drug Screen, Qualitative     Status: None   Collection Time: 09/18/21  1:44 PM  Result Value Ref Range   Tricyclic, Ur Screen NONE DETECTED NONE DETECTED   Amphetamines, Ur Screen NONE DETECTED NONE DETECTED   MDMA (Ecstasy)Ur Screen NONE DETECTED NONE DETECTED   Cocaine Metabolite,Ur [place] NONE DETECTED NONE DETECTED   Opiate, Ur Screen NONE DETECTED NONE DETECTED   Phencyclidine (PCP) Ur S NONE DETECTED NONE DETECTED   Cannabinoid 50 Ng, Ur [place] NONE DETECTED NONE DETECTED   Barbiturates, Ur Screen NONE  DETECTED NONE DETECTED   Benzodiazepine, Ur Scrn NONE DETECTED NONE DETECTED   Methadone Scn, Ur NONE DETECTED NONE DETECTED    Comment: (NOTE) Tricyclics + metabolites, urine    Cutoff 1000 ng/mL Amphetamines + metabolites, urine  Cutoff 1000 ng/mL MDMA (Ecstasy), urine              Cutoff 500 ng/mL Cocaine Metabolite, urine          Cutoff 300 ng/mL Opiate + metabolites, urine        Cutoff 300 ng/mL Phencyclidine (PCP), urine         Cutoff 25 ng/mL Cannabinoid, urine                 Cutoff 50 ng/mL Barbiturates + metabolites, urine  Cutoff 200 ng/mL Benzodiazepine, urine              Cutoff 200 ng/mL Methadone, urine  Cutoff 300 ng/mL  The urine drug screen provides only a preliminary, unconfirmed analytical test result and should not be used for non-medical purposes. Clinical consideration and professional judgment should be applied to any positive drug screen result due to possible interfering substances. A more specific alternate chemical method must be used in order to obtain a confirmed analytical result. Gas chromatography / mass spectrometry (GC/MS) is the preferred confirm atory method. Performed at [facility], [address] [place], [place] [address]   Comprehensive metabolic panel     Status: Abnormal   Collection Time: 09/18/21  1:45 PM  Result Value Ref Range   Sodium 136 135 - 145 mmol/L   Potassium 3.4 (L) 3.5 - 5.1 mmol/L   Chloride 104 98 - 111 mmol/L   CO2 22 22 - 32 mmol/L   Glucose, Bld 94 70 - 99 mg/dL    Comment: Glucose reference range applies only to samples taken after fasting for at least 8 hours.   BUN 14 6 - 20 mg/dL   Creatinine, Ser 1.40 (H) 0.61 - 1.24 mg/dL   Calcium 8.9 8.9 - 10.3 mg/dL   Total Protein 8.6 (H) 6.5 - 8.1 g/dL   Albumin 4.1 3.5 - 5.0 g/dL   AST 22 15 - 41 U/L   ALT 16 0 - 44 U/L   Alkaline Phosphatase 108 38 - 126 U/L   Total Bilirubin 1.3 (H) 0.3 - 1.2 mg/dL   GFR, Estimated >60 >60 mL/min     Comment: (NOTE) Calculated using the CKD-EPI Creatinine Equation (2021)    Anion gap 10 5 - 15    Comment: Performed at [facility], [address] [place], [place] [address]  Ethanol     Status: None   Collection Time: 09/18/21  1:45 PM  Result Value Ref Range   Alcohol, Ethyl (B) <10 <10 mg/dL    Comment: (NOTE) Lowest detectable limit for serum alcohol is 10 mg/dL.  For medical purposes only. Performed at [facility], [address] [place], [place] [id number]   Salicylate level     Status: Abnormal   Collection Time: 09/18/21  1:45 PM  Result Value Ref Range   Salicylate Lvl Q000111Q (L) 7.0 - 30.0 mg/dL    Comment: Performed at [facility], [address] [place], [place] [address]  Acetaminophen level     Status: Abnormal   Collection Time: 09/18/21  1:45 PM  Result Value Ref Range   Acetaminophen (Tylenol), Serum <10 (L) 10 - 30 ug/mL    Comment: (NOTE) Therapeutic concentrations vary significantly. A range of 10-30 ug/mL  may be an effective concentration for many patients. However, some  are best treated at concentrations outside of this range. Acetaminophen concentrations >150 ug/mL at 4 hours after ingestion  and >50 ug/mL at 12 hours after ingestion are often associated with  toxic reactions.  Performed at [facility], [address] [place],  [address]   cbc     Status: None   Collection Time: 09/18/21  1:45 PM  Result Value Ref Range   WBC 6.0 4.0 - 10.5 K/uL   RBC 4.52 4.22 - 5.81 MIL/uL   Hemoglobin 13.7 13.0 - 17.0 g/dL   HCT 40.6 39.0 - 52.0 %   MCV 89.8 80.0 - 100.0 fL   MCH 30.3 26.0 - 34.0 pg   MCHC 33.7 30.0 - 36.0 g/dL   RDW 12.8 11.5 - 15.5 %   Platelets 311 150 -  400 K/uL   nRBC 0.0 0.0 - 0.2 %    Comment: Performed at [facility], [address] [place], [place] [address]  SARS Coronavirus 2 by RT PCR (hospital order, performed in [facility] hospital lab) *cepheid single  result test* Anterior Nasal Swab     Status: None   Collection Time: 09/18/21  2:05 PM   Specimen: Anterior Nasal Swab  Result Value Ref Range   SARS Coronavirus 2 by RT PCR NEGATIVE NEGATIVE    Comment: (NOTE) SARS-CoV-2 target nucleic acids are NOT DETECTED.  The SARS-CoV-2 RNA is generally detectable in upper and lower respiratory specimens during the acute phase of infection. The lowest concentration of SARS-CoV-2 viral copies this assay can detect is 250 copies / mL. A negative result does not preclude SARS-CoV-2 infection and should not be used as the sole basis for treatment or other patient management decisions.  A negative result may occur with improper specimen collection / handling, submission of specimen other than nasopharyngeal swab, presence of viral mutation(s) within the areas targeted by this assay, and inadequate number of viral copies (<250 copies / mL). A negative result must be combined with clinical observations, patient history, and epidemiological information.  Fact Sheet for Patients:   https://www.patel.info/  Fact Sheet for Healthcare Providers: https://hall.com/  This test is not yet approved or  cleared by the [place] FDA and has been authorized for detection and/or diagnosis of SARS-CoV-2 by FDA under an Emergency Use Authorization (EUA).  This EUA will remain in effect (meaning this test can be used) for the duration of the COVID-19 declaration under Section 564(b)(1) of the Act, 21 U.S.C. section 360bbb-3(b)(1), unless the authorization is terminated or revoked sooner.  Performed at [facility], [address] [place], [place] [address]     Blood Alcohol level:  Lab Results  Component Value Date   [facility] <10 09/18/2021   ETH <10 Q000111Q    Metabolic Disorder Labs:  Lab Results  Component Value Date   HGBA1C 5.5 04/11/2020   MPG 111.15 04/11/2020   MPG 102.54 02/23/2020    No results found for: "PROLACTIN" Lab Results  Component Value Date   CHOL 201 (H) 04/11/2020   TRIG 64 04/11/2020   HDL 66 04/11/2020   CHOLHDL 3.0 04/11/2020   VLDL 13 04/11/2020   LDLCALC 122 (H) 04/11/2020   LDLCALC 111 (H) 03/27/2018    Current Medications: Current Facility-Administered Medications  Medication Dose Route Frequency Provider Last Rate Last Admin   acetaminophen (TYLENOL) tablet 650 mg  650 mg Oral Q6H PRN Sherlon Handing, NP       alum & mag hydroxide-simeth (MAALOX/MYLANTA) 200-200-20 MG/5ML suspension 30 mL  30 mL Oral Q4H PRN Sherlon Handing, NP       hydrOXYzine (ATARAX) tablet 25 mg  25 mg Oral TID PRN Sherlon Handing, NP       magnesium hydroxide (MILK OF MAGNESIA) suspension 30 mL  30 mL Oral Daily PRN Sherlon Handing, NP       OLANZapine (ZYPREXA) tablet 10 mg  10 mg Oral QHS Aleksey Newbern, Madie Reno, MD       PTA Medications: Medications Prior to Admission  Medication Sig Dispense Refill Last Dose   divalproex (DEPAKOTE ER) 500 MG 24 hr tablet Take 1 tablet (500 mg total) by mouth at bedtime. (Patient not taking: Reported on 09/18/2021) 30 tablet 1    paliperidone (INVEGA) 9 MG 24 hr tablet Take 1 tablet (9  mg total) by mouth daily. (Patient not taking: Reported on 09/18/2021) 30 tablet 1     Musculoskeletal: Strength & Muscle Tone: within normal limits Gait & Station: normal Patient leans: N/A            Psychiatric Specialty Exam:  Presentation  General Appearance: Bizarre  Eye Contact:Fleeting  Speech:Garbled (poor dentition)  Speech Volume:Normal  Handedness:No data recorded  Mood and Affect  Mood:Dysphoric  Affect:Blunt   Thought Process  Thought Processes:Disorganized  Duration of Psychotic Symptoms: Less than six months  Past Diagnosis of Schizophrenia or Psychoactive disorder: Yes  Descriptions of Associations:Loose  Orientation:Partial  Thought Content:Illogical  Hallucinations:No data  recorded Ideas of Reference:No data recorded Suicidal Thoughts:No data recorded Homicidal Thoughts:No data recorded  Sensorium  Memory:Immediate Poor  Judgment:Impaired  Insight:Lacking   Executive Functions  Concentration:Poor  Attention Span:Poor  Recall:Poor  Fund of Knowledge:Poor  Language:Poor   Psychomotor Activity  Psychomotor Activity:No data recorded  Assets  Assets:Financial Resources/Insurance; Housing; Resilience   Sleep  Sleep:No data recorded   Physical Exam: Physical Exam Vitals and nursing note reviewed.  Constitutional:      Appearance: Normal appearance.  HENT:     Head: Normocephalic and atraumatic.     Mouth/Throat:     Pharynx: Oropharynx is clear.  Eyes:     Pupils: Pupils are equal, round, and reactive to light.  Cardiovascular:     Rate and Rhythm: Normal rate and regular rhythm.  Pulmonary:     Effort: Pulmonary effort is normal.     Breath sounds: Normal breath sounds.  Abdominal:     General: Abdomen is flat.     Palpations: Abdomen is soft.  Musculoskeletal:        General: Normal range of motion.  Skin:    General: Skin is warm and dry.  Neurological:     General: No focal deficit present.     Mental Status: He is alert. Mental status is at baseline.  Psychiatric:        Attention and Perception: He is inattentive.        Mood and Affect: Mood normal. Affect is blunt.        Speech: He is noncommunicative. Speech is delayed.        Behavior: Behavior is cooperative.        Thought Content: Thought content normal.        Cognition and Memory: Cognition is impaired.    Review of Systems  Constitutional: Negative.   HENT: Negative.    Eyes: Negative.   Respiratory: Negative.    Cardiovascular: Negative.   Gastrointestinal: Negative.   Musculoskeletal: Negative.   Skin: Negative.   Neurological: Negative.   Psychiatric/Behavioral: Negative.     Blood pressure (!) 119/96, pulse 88, temperature 97.7 F (36.5  C), temperature source Oral, resp. rate 18, height 5\' 11"  (1.803 m), weight 87.5 kg, SpO2 100 %. Body mass index is 26.92 kg/m.  Treatment Plan Summary: Medication management and Plan patient was given his long-acting injectable in the emergency room and continued on oral Invega.  He is already looking better than his behavior would suggest on first presentation.  After speaking with his sister I have adjusted his medicine by discontinuing the Depakote restarting olanzapine at a modest dose at night.  Hospital does not carry the extremely expensive and grays of which we will not restart at this point but can be continued outpatient if outpatient providers deemed appropriate.  Labs reviewed.  We will check any  extra labs that need looking at.  Engage patient with treatment team and groups with ongoing assessment on the unit before discharging to outpatient treatment  Observation Level/Precautions:  15 minute checks  Laboratory:  Chemistry Profile  Psychotherapy:    Medications:    Consultations:    Discharge Concerns:    Estimated LOS:  Other:     Physician Treatment Plan for Primary Diagnosis: Schizophrenia ([place]) Long Term Goal(s): Improvement in symptoms so as ready for discharge  Short Term Goals: Ability to demonstrate self-control will improve and Ability to maintain clinical measurements within normal limits will improve  Physician Treatment Plan for Secondary Diagnosis: Principal Problem:   Schizophrenia ([place]) Active Problems:   Tardive dyskinesia  Long Term Goal(s): Improvement in symptoms so as ready for discharge  Short Term Goals: Compliance with prescribed medications will improve  I certify that inpatient services furnished can reasonably be expected to improve the patient's condition.    Alethia Berthold, MD 8/24/202311:07 AM

## 2021-09-20 NOTE — Progress Notes (Signed)
Pt minimal with staff and tonight and isolative to his room. Pt compliant with medication administration per MD orders. Pt denies SI/HI/AVH with this writer but presents as responding to internal stimuli. Pt given education, support, and encouragement to be active in his treatment plan. Pt being monitored Q 15 minutes for safety per unit protocol, remains safe on the unit.  

## 2021-09-20 NOTE — Progress Notes (Signed)
Recreation Therapy Notes  Date: 09/20/2021  Time: 10:35 am    Location: Craft room    Behavioral response: N/A   Intervention Topic: Stress Management   Discussion/Intervention: Patient refused to attend group.   Clinical Observations/Feedback:  Patient refused to attend group.    Shane Nash LRT/CTRS         [name] 09/20/2021 11:53 AM

## 2021-09-20 NOTE — Progress Notes (Addendum)
Pt presents with paranoid , suspicious mood, affect blunted. Brandn is very guarded, cautious and forwards little. He appears wide eyed and when asking how he is doing he forwards little but states fine. He denies any physical or acute concerns but leers away from peers as he is suspicious. Pt compliant with am medications. Denies any SI or HI or A/V Hallucinations. He completed his self inventory this am and rated his depression at 7/10 on scale with 10 being worst depression and 0 being none. He rated his hopelessness at 6/10 on same scale. Pt is safe, he is able to verbalize his needs and appears in no acute distress. Will con't to monitor.  1500- Pt has been quiet, isolative to his room and with minimal interactions with peers. Did come outside with encouragement from writer to courtyard but sat in corner away from peers. Pt denies any acute concerns.

## 2021-09-20 NOTE — [facility] Group Notes (Signed)
[facility] Group Notes:  (Nursing/MHT/Case Management/Adjunct)  Date:  09/20/2021  Time:  10:57 AM  Type of Therapy:   community meeting  Participation Level:  Did Not Attend   Rodena Goldmann 09/20/2021, 10:57 AM

## 2021-09-20 NOTE — Group Note (Signed)
[facility] LCSW Group Therapy Note   Group Date: 09/20/2021 Start Time: 1300 End Time: 1400   Type of Therapy/Topic:  Group Therapy:  Balance in Life  Participation Level:  Did Not Attend   Description of Group:    This group will address the concept of balance and how it feels and looks when one is unbalanced. Patients will be encouraged to process areas in their lives that are out of balance, and identify reasons for remaining unbalanced. Facilitators will guide patients utilizing problem- solving interventions to address and correct the stressor making their life unbalanced. Understanding and applying boundaries will be explored and addressed for obtaining  and maintaining a balanced life. Patients will be encouraged to explore ways to assertively make their unbalanced needs known to significant others in their lives, using other group members and facilitator for support and feedback.  Therapeutic Goals: Patient will identify two or more emotions or situations they have that consume much of in their lives. Patient will identify signs/triggers that life has become out of balance:  Patient will identify two ways to set boundaries in order to achieve balance in their lives:  Patient will demonstrate ability to communicate their needs through discussion and/or role plays  Summary of Patient Progress: X   Therapeutic Modalities:   Cognitive Behavioral Therapy Solution-Focused Therapy Assertiveness Training   Rayansh Herbst R Exodus Kutzer, LCSW 

## 2021-09-20 NOTE — [facility] Suicide Risk Assessment (Signed)
[facility] Admission Suicide Risk Assessment   Nursing information obtained from:  Patient, Review of record Demographic factors:  Male, Unemployed Current Mental Status:  NA Loss Factors:  NA Historical Factors:  NA Risk Reduction Factors:  Positive social support  Total Time spent with patient: 45 minutes Principal Problem: Schizophrenia (HCC) Diagnosis:  Principal Problem:   Schizophrenia (HCC) Active Problems:   Tardive dyskinesia  Subjective Data: 41 year old man with a known history of schizophrenia brought to the hospital after rapid decompensation at home with bizarre abnormal dangerous behavior.  On interview today the patient is calm and cooperative and able to sit still for an interview.  His speech is difficult to understand and his thoughts remain disorganized but he is able to answer basic questions and follow basic instructions.  He denies any suicidal or homicidal thoughts and is agreeable to medication management.  Completely denies suicidal thoughts.  Continued Clinical Symptoms:  Alcohol Use Disorder Identification Test Final Score (AUDIT): 0 The "Alcohol Use Disorders Identification Test", Guidelines for Use in Primary Care, Second Edition.  World Science writer [facility]). Score between 0-7:  no or low risk or alcohol related problems. Score between 8-15:  moderate risk of alcohol related problems. Score between 16-19:  high risk of alcohol related problems. Score 20 or above:  warrants further diagnostic evaluation for alcohol dependence and treatment.   CLINICAL FACTORS:   Schizophrenia:   Paranoid or undifferentiated type   Musculoskeletal: Strength & Muscle Tone: within normal limits Gait & Station: normal Patient leans: N/A  Psychiatric Specialty Exam:  Presentation  General Appearance: Bizarre  Eye Contact:Fleeting  Speech:Garbled (poor dentition)  Speech Volume:Normal  Handedness:No data recorded  Mood and Affect   Mood:Dysphoric  Affect:Blunt   Thought Process  Thought Processes:Disorganized  Descriptions of Associations:Loose  Orientation:Partial  Thought Content:Illogical  History of Schizophrenia/Schizoaffective disorder:Yes  Duration of Psychotic Symptoms:Less than six months  Hallucinations:No data recorded Ideas of Reference:No data recorded Suicidal Thoughts:No data recorded Homicidal Thoughts:No data recorded  Sensorium  Memory:Immediate Poor  Judgment:Impaired  Insight:Lacking   Executive Functions  Concentration:Poor  Attention Span:Poor  Recall:Poor  Fund of Knowledge:Poor  Language:Poor   Psychomotor Activity  Psychomotor Activity:No data recorded  Assets  Assets:Financial Resources/Insurance; Housing; Resilience   Sleep  Sleep:No data recorded   Physical Exam: Physical Exam Vitals and nursing note reviewed.  Constitutional:      Appearance: Normal appearance.  HENT:     Head: Normocephalic and atraumatic.     Mouth/Throat:     Pharynx: Oropharynx is clear.  Eyes:     Pupils: Pupils are equal, round, and reactive to light.  Cardiovascular:     Rate and Rhythm: Normal rate and regular rhythm.  Pulmonary:     Effort: Pulmonary effort is normal.     Breath sounds: Normal breath sounds.  Abdominal:     General: Abdomen is flat.     Palpations: Abdomen is soft.  Musculoskeletal:        General: Normal range of motion.  Skin:    General: Skin is warm and dry.  Neurological:     General: No focal deficit present.     Mental Status: He is alert. Mental status is at baseline.  Psychiatric:        Attention and Perception: He is inattentive.        Mood and Affect: Mood normal. Affect is blunt.        Speech: He is noncommunicative. Speech is delayed.  Behavior: Behavior is slowed.        Thought Content: Thought content normal.        Cognition and Memory: Cognition is impaired.    Review of Systems  Constitutional: Negative.    HENT: Negative.    Eyes: Negative.   Respiratory: Negative.    Cardiovascular: Negative.   Gastrointestinal: Negative.   Musculoskeletal: Negative.   Skin: Negative.   Neurological: Negative.   Psychiatric/Behavioral: Negative.     Blood pressure (!) 119/96, pulse 88, temperature 97.7 F (36.5 C), temperature source Oral, resp. rate 18, height 5\' 11"  (1.803 m), weight 87.5 kg, SpO2 100 %. Body mass index is 26.92 kg/m.   COGNITIVE FEATURES THAT CONTRIBUTE TO RISK:  Thought constriction (tunnel vision)    SUICIDE RISK:   Minimal: No identifiable suicidal ideation.  Patients presenting with no risk factors but with morbid ruminations; may be classified as minimal risk based on the severity of the depressive symptoms  PLAN OF CARE: Continue 15-minute checks restart antipsychotic medication.  Daily reassessment of dangerousness prior to discharge.  Communication with family.  Ongoing assessment of symptom improvement  I certify that inpatient services furnished can reasonably be expected to improve the patient's condition.   , MD 09/20/2021, 11:05 AM

## 2021-09-21 DIAGNOSIS — F203 Undifferentiated schizophrenia: Secondary | ICD-10-CM | POA: Diagnosis not present

## 2021-09-21 LAB — HEMOGLOBIN A1C
Hgb A1c MFr Bld: 5.1 % (ref 4.8–5.6)
Mean Plasma Glucose: 99.67 mg/dL

## 2021-09-21 NOTE — BH IP Treatment Plan (Signed)
Interdisciplinary Treatment and Diagnostic Plan Update  09/21/2021 Time of Session: 09:44 Shane Nash MRN: [id number]  Principal Diagnosis: Schizophrenia [facility])  Secondary Diagnoses: Principal Problem:   Schizophrenia (HCC) Active Problems:   Tardive dyskinesia   Current Medications:  Current Facility-Administered Medications  Medication Dose Route Frequency Provider Last Rate Last Admin   acetaminophen (TYLENOL) tablet 650 mg  650 mg Oral Q6H PRN Vanetta Mulders, NP       alum & mag hydroxide-simeth (MAALOX/MYLANTA) 200-200-20 MG/5ML suspension 30 mL  30 mL Oral Q4H PRN Vanetta Mulders, NP       hydrOXYzine (ATARAX) tablet 25 mg  25 mg Oral TID PRN Vanetta Mulders, NP       magnesium hydroxide (MILK OF MAGNESIA) suspension 30 mL  30 mL Oral Daily PRN Vanetta Mulders, NP       OLANZapine (ZYPREXA) tablet 10 mg  10 mg Oral QHS [name], [name], MD   10 mg at 09/20/21 2128   PTA Medications: Medications Prior to Admission  Medication Sig Dispense Refill Last Dose   divalproex (DEPAKOTE ER) 500 MG 24 hr tablet Take 1 tablet (500 mg total) by mouth at bedtime. (Patient not taking: Reported on 09/18/2021) 30 tablet 1    paliperidone (INVEGA) 9 MG 24 hr tablet Take 1 tablet (9 mg total) by mouth daily. (Patient not taking: Reported on 09/18/2021) 30 tablet 1     Patient Stressors: Medication change or noncompliance    Patient Strengths: Supportive family/friends   Treatment Modalities: Medication Management, Group therapy, Case management,  1 to 1 session with clinician, Psychoeducation, Recreational therapy.   Physician Treatment Plan for Primary Diagnosis: Schizophrenia (HCC) Long Term Goal(s): Improvement in symptoms so as ready for discharge   Short Term Goals: Compliance with prescribed medications will improve Ability to demonstrate self-control will improve Ability to maintain clinical measurements within normal limits will improve  Medication Management:  Evaluate patient's response, side effects, and tolerance of medication regimen.  Therapeutic Interventions: 1 to 1 sessions, Unit Group sessions and Medication administration.  Evaluation of Outcomes: Progressing  Physician Treatment Plan for Secondary Diagnosis: Principal Problem:   Schizophrenia (HCC) Active Problems:   Tardive dyskinesia  Long Term Goal(s): Improvement in symptoms so as ready for discharge   Short Term Goals: Compliance with prescribed medications will improve Ability to demonstrate self-control will improve Ability to maintain clinical measurements within normal limits will improve     Medication Management: Evaluate patient's response, side effects, and tolerance of medication regimen.  Therapeutic Interventions: 1 to 1 sessions, Unit Group sessions and Medication administration.  Evaluation of Outcomes: Progressing   RN Treatment Plan for Primary Diagnosis: Schizophrenia (HCC) Long Term Goal(s): Knowledge of disease and therapeutic regimen to maintain health will improve  Short Term Goals: Ability to remain free from injury will improve, Ability to verbalize frustration and anger appropriately will improve, Ability to demonstrate self-control, Ability to participate in decision making will improve, Ability to verbalize feelings will improve, Ability to disclose and discuss suicidal ideas, Ability to identify and develop effective coping behaviors will improve, and Compliance with prescribed medications will improve  Medication Management: RN will administer medications as ordered by provider, will assess and evaluate patient's response and provide education to patient for prescribed medication. RN will report any adverse and/or side effects to prescribing provider.  Therapeutic Interventions: 1 on 1 counseling sessions, Psychoeducation, Medication administration, Evaluate responses to treatment, Monitor vital signs and CBGs as ordered, Perform/monitor CIWA, COWS,  AIMS and Fall Risk screenings as ordered, Perform wound care treatments as ordered.  Evaluation of Outcomes: Progressing   LCSW Treatment Plan for Primary Diagnosis: Schizophrenia (HCC) Long Term Goal(s): Safe transition to appropriate next level of care at discharge, Engage patient in therapeutic group addressing interpersonal concerns.  Short Term Goals: Engage patient in aftercare planning with referrals and resources, Increase social support, Increase ability to appropriately verbalize feelings, Increase emotional regulation, Facilitate acceptance of mental health diagnosis and concerns, and Increase skills for wellness and recovery  Therapeutic Interventions: Assess for all discharge needs, 1 to 1 time with Social worker, Explore available resources and support systems, Assess for adequacy in community support network, Educate family and significant other(s) on suicide prevention, Complete Psychosocial Assessment, Interpersonal group therapy.  Evaluation of Outcomes: Progressing   Progress in Treatment: Attending groups: No. Participating in groups: No. Taking medication as prescribed: Yes. Toleration medication: Yes. Family/Significant other contact made: No, will contact:  guardian, Shane Nash (sister). Patient understands diagnosis: Yes. Discussing patient identified problems/goals with staff: Yes. Medical problems stabilized or resolved: Yes. Denies suicidal/homicidal ideation: Yes. Issues/concerns per patient self-inventory: No. Other: none.  New problem(s) identified: No, Describe:  none identified.  New Short Term/Long Term Goal(s): elimination of symptoms of psychosis, medication management for mood stabilization; elimination of SI thoughts; development of comprehensive mental wellness plan.  Patient Goals: "I'll be glad when I leave here."   Discharge Plan or Barriers: CSW will assist pt with development of an appropriate aftercare/discharge plan.   Reason for  Continuation of Hospitalization: Medication stabilization Other; describe psychosis.  Estimated Length of Stay: 1-7 days  Last 3 [place] Suicide Severity Risk Score: Flowsheet Row Admission (Current) from 09/19/2021 in [facility]PATIENT [facility] MEDICINE ED from 09/18/2021 in [facility] EMERGENCY DEPART[facility] ED from 04/10/2020 in [facility]  C-SSRS RISK CATEGORY No Risk No Risk No Risk       Last PHQ 2/9 Scores:     No data to display          Scribe for Treatment Team: Glenis Smoker, LCSW 09/21/2021 10:15 AM

## 2021-09-21 NOTE — Progress Notes (Signed)
Recreation Therapy Notes  INPATIENT RECREATION TR PLAN  Patient Details Name: Shane Nash MRN: [id number] DOB: Sep 08, 1980 Today's Date: 09/21/2021  Rec Therapy Plan Is patient appropriate for Therapeutic Recreation?: Yes Treatment times per week: at least 3 Estimated Length of Stay: 5-7 days TR Treatment/Interventions: Group participation (Comment)  Discharge Criteria Pt will be discharged from therapy if:: Discharged Treatment plan/goals/alternatives discussed and agreed upon by:: Patient/family  Discharge Summary     Kharisma Glasner 09/21/2021, 3:56 PM

## 2021-09-21 NOTE — Progress Notes (Signed)
[facility] MD Progress Note  09/21/2021 3:15 PM Shane Nash  MRN:  [id number] Subjective: Patient seen and attended treatment team.  Still somewhat disorganized but his behavior is calm without any dangerous or bizarre behavior and he is taking his medicine. Principal Problem: Schizophrenia ([place]) Diagnosis: Principal Problem:   Schizophrenia ([place]) Active Problems:   Tardive dyskinesia  Total Time spent with patient: 30 minutes  Past Psychiatric History: Past history of schizophrenia  Past Medical History:  Past Medical History:  Diagnosis Date   Schizophrenia ([place])    History reviewed. No pertinent surgical history. Family History: History reviewed. No pertinent family history. Family Psychiatric  History: See previous Social History:  Social History   Substance and Sexual Activity  Alcohol Use Not Currently     Social History   Substance and Sexual Activity  Drug Use No    Social History   Socioeconomic History   Marital status: Single    Spouse name: Not on file   Number of children: Not on file   Years of education: Not on file   Highest education level: Not on file  Occupational History   Not on file  Tobacco Use   Smoking status: Every Day    Packs/day: 0.25    Years: 15.00    Total pack years: 3.75    Types: Cigarettes   Smokeless tobacco: Never   Tobacco comments:    Cannot state the amount he smokes per day  Vaping Use   Vaping Use: Never used  Substance and Sexual Activity   Alcohol use: Not Currently   Drug use: No   Sexual activity: Not Currently  Other Topics Concern   Not on file  Social History Narrative   Not on file   Social Determinants of Health   Financial Resource Strain: Not on file  Food Insecurity: Not on file  Transportation Needs: Not on file  Physical Activity: Not on file  Stress: Not on file  Social Connections: Not on file   Additional Social History:                         Sleep: Fair  Appetite:   Fair  Current Medications: Current Facility-Administered Medications  Medication Dose Route Frequency Provider Last Rate Last Admin   acetaminophen (TYLENOL) tablet 650 mg  650 mg Oral Q6H PRN Sherlon Handing, NP       alum & mag hydroxide-simeth (MAALOX/MYLANTA) 200-200-20 MG/5ML suspension 30 mL  30 mL Oral Q4H PRN Sherlon Handing, NP       hydrOXYzine (ATARAX) tablet 25 mg  25 mg Oral TID PRN Sherlon Handing, NP       magnesium hydroxide (MILK OF MAGNESIA) suspension 30 mL  30 mL Oral Daily PRN Sherlon Handing, NP       OLANZapine (ZYPREXA) tablet 10 mg  10 mg Oral QHS Gianelle Mccaul T, MD   10 mg at 09/20/21 2128    Lab Results:  Results for orders placed or performed during the hospital encounter of 09/19/21 (from the past 48 hour(s))  Lipid panel     Status: Abnormal   Collection Time: 09/20/21 12:48 PM  Result Value Ref Range   Cholesterol 213 (H) 0 - 200 mg/dL   Triglycerides 174 (H) <150 mg/dL   HDL 39 (L) >40 mg/dL   Total CHOL/HDL Ratio 5.5 RATIO   VLDL 35 0 - 40 mg/dL   LDL Cholesterol 139 (H) 0 -  99 mg/dL    Comment:        Total Cholesterol/HDL:CHD Risk Coronary Heart Disease Risk Table                     Men   Women  1/2 Average Risk   3.4   3.3  Average Risk       5.0   4.4  2 X Average Risk   9.6   7.1  3 X Average Risk  23.4   11.0        Use the calculated Patient Ratio above and the CHD Risk Table to determine the patient's CHD Risk.        ATP III CLASSIFICATION (LDL):  <100     mg/dL   Optimal  545-625  mg/dL   Near or Above                    Optimal  130-159  mg/dL   Borderline  638-937  mg/dL   High  >342     mg/dL   Very High Performed at [facility], [address] [place], [place] [address]   Hemoglobin A1c     Status: None   Collection Time: 09/20/21 12:48 PM  Result Value Ref Range   Hgb A1c MFr Bld 5.1 4.8 - 5.6 %    Comment: (NOTE) Pre diabetes:          5.7%-6.4%  Diabetes:              >6.4%  Glycemic  control for   <7.0% adults with diabetes    Mean Plasma Glucose 99.67 mg/dL    Comment: Performed at [facility], [address] [place], [place] [address]    Blood Alcohol level:  Lab Results  Component Value Date   [facility] <10 09/18/2021   ETH <10 04/11/2020    Metabolic Disorder Labs: Lab Results  Component Value Date   HGBA1C 5.1 09/20/2021   MPG 99.67 09/20/2021   MPG 111.15 04/11/2020   No results found for: "PROLACTIN" Lab Results  Component Value Date   CHOL 213 (H) 09/20/2021   TRIG 174 (H) 09/20/2021   HDL 39 (L) 09/20/2021   CHOLHDL 5.5 09/20/2021   VLDL 35 09/20/2021   LDLCALC 139 (H) 09/20/2021   LDLCALC 122 (H) 04/11/2020    Physical Findings: AIMS:  , ,  ,  ,    CIWA:    COWS:     Musculoskeletal: Strength & Muscle Tone: within normal limits Gait & Station: normal Patient leans: N/A  Psychiatric Specialty Exam:  Presentation  General Appearance: Bizarre  Eye Contact:Fleeting  Speech:Garbled (poor dentition)  Speech Volume:Normal  Handedness:No data recorded  Mood and Affect  Mood:Dysphoric  Affect:Blunt   Thought Process  Thought Processes:Disorganized  Descriptions of Associations:Loose  Orientation:Partial  Thought Content:Illogical  History of Schizophrenia/Schizoaffective disorder:Yes  Duration of Psychotic Symptoms:Less than six months  Hallucinations:No data recorded Ideas of Reference:No data recorded Suicidal Thoughts:No data recorded Homicidal Thoughts:No data recorded  Sensorium  Memory:Immediate Poor  Judgment:Impaired  Insight:Lacking   Executive Functions  Concentration:Poor  Attention Span:Poor  Recall:Poor  Fund of Knowledge:Poor  Language:Poor   Psychomotor Activity  Psychomotor Activity:No data recorded  Assets  Assets:Financial Resources/Insurance; Housing; Resilience   Sleep  Sleep:No data recorded   Physical Exam: Physical Exam Vitals and nursing note reviewed.   Constitutional:      Appearance: Normal appearance.  HENT:  Head: Normocephalic and atraumatic.     Mouth/Throat:     Pharynx: Oropharynx is clear.  Eyes:     Pupils: Pupils are equal, round, and reactive to light.  Cardiovascular:     Rate and Rhythm: Normal rate and regular rhythm.  Pulmonary:     Effort: Pulmonary effort is normal.     Breath sounds: Normal breath sounds.  Abdominal:     General: Abdomen is flat.     Palpations: Abdomen is soft.  Musculoskeletal:        General: Normal range of motion.  Skin:    General: Skin is warm and dry.  Neurological:     General: No focal deficit present.     Mental Status: He is alert. Mental status is at baseline.  Psychiatric:        Attention and Perception: He is inattentive.        Mood and Affect: Mood normal. Affect is blunt.        Speech: Speech is delayed.        Behavior: Behavior is slowed.        Thought Content: Thought content normal.        Cognition and Memory: Memory is impaired.    Review of Systems  Constitutional: Negative.   HENT: Negative.    Eyes: Negative.   Respiratory: Negative.    Cardiovascular: Negative.   Gastrointestinal: Negative.   Musculoskeletal: Negative.   Skin: Negative.   Neurological: Negative.   Psychiatric/Behavioral: Negative.     Blood pressure 110/82, pulse 66, temperature 97.7 F (36.5 C), temperature source Oral, resp. rate 18, height 5\' 11"  (1.803 m), weight 87.5 kg, SpO2 100 %. Body mass index is 26.92 kg/m.   Treatment Plan Summary: Plan no change to medication management.  Continue daily observation and assessment hope for discharge this upcoming week  , MD 09/21/2021, 3:15 PM

## 2021-09-21 NOTE — Progress Notes (Signed)
Recreation Therapy Notes  INPATIENT RECREATION THERAPY ASSESSMENT  Patient Details Name: Shane Nash MRN: [id number] DOB: [birth year] Today's Date: 09/21/2021       Information Obtained From: Patient (Unable to at this time)  Able to Participate in Assessment/Interview:    Patient Presentation:    Reason for Admission (Per Patient):    Patient Stressors:    Coping Skills:      Leisure Interests (2+):     Frequency of Recreation/Participation:    Awareness of Community Resources:     [employer]:     Current Use:    If no, Barriers?:    Expressed Interest in [employer] Information:    [place] of Residence:     Patient Main Form of Transportation:    Patient Strengths:     Patient Identified Areas of Improvement:     Patient Goal for Hospitalization:     Current SI (including self-harm):     Current HI:     Current AVH:    Staff Intervention Plan:    Consent to Intern Participation:    Eleaner Dibartolo 09/21/2021, 3:55 PM

## 2021-09-21 NOTE — Plan of Care (Signed)
D- Patient alert and oriented to person and situation. Patient presented in a pleasant mood on assessment stating that he slept "good" last night and had no complaints to voice to this Clinical research associate. Patient is childlike and has some developmental delay. His speech is also incomprehensible at times. Patient endorsed AH, stating "I hear what I hear in my head", however, he denied HI, VH, and pain at this time. Patient also denied any signs/symptoms of depression and anxiety, stating that overall, he is feeling "healthy". Patient had no stated goals for today, but he did state in treatment team that "I'll be glad when I get to leave here".  A- Support and encouragement provided.  Routine safety checks conducted every 15 minutes. Patient informed to notify staff with problems or concerns.  R- No adverse drug reactions noted. Patient contracts for safety at this time. Patient receptive, calm, and cooperative. Patient remains safe at this time.  Problem: Education: Goal: Knowledge of General Education information will improve Description: Including pain rating scale, medication(s)/side effects and non-pharmacologic comfort measures Outcome: Progressing   Problem: Health Behavior/Discharge Planning: Goal: Ability to manage health-related needs will improve Outcome: Progressing   Problem: Clinical Measurements: Goal: Ability to maintain clinical measurements within normal limits will improve Outcome: Progressing Goal: Will remain free from infection Outcome: Progressing Goal: Diagnostic test results will improve Outcome: Progressing Goal: Respiratory complications will improve Outcome: Progressing Goal: Cardiovascular complication will be avoided Outcome: Progressing   Problem: Activity: Goal: Risk for activity intolerance will decrease Outcome: Progressing   Problem: Nutrition: Goal: Adequate nutrition will be maintained Outcome: Progressing   Problem: Coping: Goal: Level of anxiety will  decrease Outcome: Progressing   Problem: Elimination: Goal: Will not experience complications related to bowel motility Outcome: Progressing Goal: Will not experience complications related to urinary retention Outcome: Progressing   Problem: Pain Managment: Goal: General experience of comfort will improve Outcome: Progressing   Problem: Safety: Goal: Ability to remain free from injury will improve Outcome: Progressing   Problem: Skin Integrity: Goal: Risk for impaired skin integrity will decrease Outcome: Progressing   Problem: Education: Goal: Knowledge of [facility] Education information/materials will improve Outcome: Progressing Goal: Emotional status will improve Outcome: Progressing Goal: Mental status will improve Outcome: Progressing Goal: Verbalization of understanding the information provided will improve Outcome: Progressing   Problem: Health Behavior/Discharge Planning: Goal: Identification of resources available to assist in meeting health care needs will improve Outcome: Progressing Goal: Compliance with treatment plan for underlying cause of condition will improve Outcome: Progressing   Problem: Activity: Goal: Will identify at least one activity in which they can participate Outcome: Progressing   Problem: Coping: Goal: Ability to identify and develop effective coping behavior will improve Outcome: Progressing Goal: Ability to interact with others will improve Outcome: Progressing Goal: Demonstration of participation in decision-making regarding own care will improve Outcome: Progressing Goal: Ability to use eye contact when communicating with others will improve Outcome: Progressing   Problem: Self-Concept: Goal: Will verbalize positive feelings about self Outcome: Progressing

## 2021-09-21 NOTE — Progress Notes (Signed)
Recreation Therapy Notes  Date: 09/21/2021  Time: 10:50 am    Location: Courtyard    Behavioral response: N/A   Intervention Topic: Social skills    Discussion/Intervention: Patient refused to attend group.   Clinical Observations/Feedback:  Patient refused to attend group.    Sadeen Wiegel LRT/CTRS        Shane Nash 09/21/2021 1:00 PM 

## 2021-09-21 NOTE — Group Note (Signed)
[facility] LCSW Group Therapy Note   Group Date: 09/21/2021 Start Time: 1300 End Time: 1400  Type of Therapy and Topic:  Group Therapy:  Feelings around Relapse and Recovery  Participation Level:  Minimal    Description of Group:    Patients in this group will discuss emotions they experience before and after a relapse. They will process how experiencing these feelings, or avoidance of experiencing them, relates to having a relapse. Facilitator will guide patients to explore emotions they have related to recovery. Patients will be encouraged to process which emotions are more powerful. They will be guided to discuss the emotional reaction significant others in their lives may have to patients' relapse or recovery. Patients will be assisted in exploring ways to respond to the emotions of others without this contributing to a relapse.  Therapeutic Goals: Patient will identify two or more emotions that lead to relapse for them:  Patient will identify two emotions that result when they relapse:  Patient will identify two emotions related to recovery:  Patient will demonstrate ability to communicate their needs through discussion and/or role plays.   Summary of Patient Progress: Patient was present in the group for a short time. He stated that he did not know in response to the icebreaker. Prior to facilitator asking the next question, pt stated that he had to go to his room but that he would be back. Pt did not return.    Therapeutic Modalities:   Cognitive Behavioral Therapy Solution-Focused Therapy Assertiveness Training Relapse Prevention Therapy   Glenis Smoker, LCSW

## 2021-09-21 NOTE — Progress Notes (Signed)
Pt minimal with staff and tonight and isolative to his room. Pt compliant with medication administration per MD orders. Pt denies SI/HI/AVH with this Clinical research associate but presents as responding to internal stimuli. Pt given education, support, and encouragement to be active in his treatment plan. Pt being monitored Q 15 minutes for safety per unit protocol, remains safe on the unit.

## 2021-09-22 DIAGNOSIS — F203 Undifferentiated schizophrenia: Secondary | ICD-10-CM | POA: Diagnosis not present

## 2021-09-22 NOTE — Progress Notes (Signed)
[facility] MD Progress Note  09/22/2021 12:56 PM Shane Nash  MRN:  [id number] Subjective: Patient seen and chart reviewed.  Much more calm and articulate.  No bizarre behavior. Principal Problem: Schizophrenia (HCC) Diagnosis: Principal Problem:   Schizophrenia (HCC) Active Problems:   Tardive dyskinesia  Total Time spent with patient: 30 minutes  Past Psychiatric History: Past history of schizophrenia  Past Medical History:  Past Medical History:  Diagnosis Date   Schizophrenia (HCC)    History reviewed. No pertinent surgical history. Family History: History reviewed. No pertinent family history. Family Psychiatric  History: See previous Social History:  Social History   Substance and Sexual Activity  Alcohol Use Not Currently     Social History   Substance and Sexual Activity  Drug Use No    Social History   Socioeconomic History   Marital status: Single    Spouse name: Not on file   Number of children: Not on file   Years of education: Not on file   Highest education level: Not on file  Occupational History   Not on file  Tobacco Use   Smoking status: Every Day    Packs/day: 0.25    Years: 15.00    Total pack years: 3.75    Types: Cigarettes   Smokeless tobacco: Never   Tobacco comments:    Cannot state the amount he smokes per day  Vaping Use   Vaping Use: Never used  Substance and Sexual Activity   Alcohol use: Not Currently   Drug use: No   Sexual activity: Not Currently  Other Topics Concern   Not on file  Social History Narrative   Not on file   Social Determinants of Health   Financial Resource Strain: Not on file  Food Insecurity: Not on file  Transportation Needs: Not on file  Physical Activity: Not on file  Stress: Not on file  Social Connections: Not on file   Additional Social History:                         Sleep: Fair  Appetite:  Fair  Current Medications: Current Facility-Administered Medications  Medication  Dose Route Frequency Provider Last Rate Last Admin   acetaminophen (TYLENOL) tablet 650 mg  650 mg Oral Q6H PRN Vanetta Mulders, NP       alum & mag hydroxide-simeth (MAALOX/MYLANTA) 200-200-20 MG/5ML suspension 30 mL  30 mL Oral Q4H PRN Gabriel Cirri F, NP       hydrOXYzine (ATARAX) tablet 25 mg  25 mg Oral TID PRN Vanetta Mulders, NP   25 mg at 09/21/21 2126   magnesium hydroxide (MILK OF MAGNESIA) suspension 30 mL  30 mL Oral Daily PRN Gabriel Cirri F, NP       OLANZapine (ZYPREXA) tablet 10 mg  10 mg Oral QHS Denyce Harr, [name], MD   10 mg at 09/21/21 2126    Lab Results: No results found for this or any previous visit (from the past 48 hour(s)).  Blood Alcohol level:  Lab Results  Component Value Date   ETH <10 09/18/2021   ETH <10 04/11/2020    Metabolic Disorder Labs: Lab Results  Component Value Date   HGBA1C 5.1 09/20/2021   MPG 99.67 09/20/2021   MPG 111.15 04/11/2020   No results found for: "PROLACTIN" Lab Results  Component Value Date   CHOL 213 (H) 09/20/2021   TRIG 174 (H) 09/20/2021   HDL 39 (L) 09/20/2021  CHOLHDL 5.5 09/20/2021   VLDL 35 09/20/2021   LDLCALC 139 (H) 09/20/2021   LDLCALC 122 (H) 04/11/2020    Physical Findings: AIMS:  , ,  ,  ,    CIWA:    COWS:     Musculoskeletal: Strength & Muscle Tone: within normal limits Gait & Station: normal Patient leans: N/A  Psychiatric Specialty Exam:  Presentation  General Appearance: Bizarre  Eye Contact:Fleeting  Speech:Garbled (poor dentition)  Speech Volume:Normal  Handedness:No data recorded  Mood and Affect  Mood:Dysphoric  Affect:Blunt   Thought Process  Thought Processes:Disorganized  Descriptions of Associations:Loose  Orientation:Partial  Thought Content:Illogical  History of Schizophrenia/Schizoaffective disorder:Yes  Duration of Psychotic Symptoms:Less than six months  Hallucinations:No data recorded Ideas of Reference:No data recorded Suicidal  Thoughts:No data recorded Homicidal Thoughts:No data recorded  Sensorium  Memory:Immediate Poor  Judgment:Impaired  Insight:Lacking   Executive Functions  Concentration:Poor  Attention Span:Poor  Recall:Poor  Fund of Knowledge:Poor  Language:Poor   Psychomotor Activity  Psychomotor Activity:No data recorded  Assets  Assets:Financial Resources/Insurance; Housing; Resilience   Sleep  Sleep:No data recorded   Physical Exam: Physical Exam Vitals and nursing note reviewed.  Constitutional:      Appearance: Normal appearance.  HENT:     Head: Normocephalic and atraumatic.     Mouth/Throat:     Pharynx: Oropharynx is clear.  Eyes:     Pupils: Pupils are equal, round, and reactive to light.  Cardiovascular:     Rate and Rhythm: Normal rate and regular rhythm.  Pulmonary:     Effort: Pulmonary effort is normal.     Breath sounds: Normal breath sounds.  Abdominal:     General: Abdomen is flat.     Palpations: Abdomen is soft.  Musculoskeletal:        General: Normal range of motion.  Skin:    General: Skin is warm and dry.  Neurological:     General: No focal deficit present.     Mental Status: He is alert. Mental status is at baseline.  Psychiatric:        Attention and Perception: Attention normal.        Mood and Affect: Mood normal. Affect is blunt.        Speech: Speech normal.        Behavior: Behavior normal.        Thought Content: Thought content normal.        Cognition and Memory: Cognition normal.    Review of Systems  Constitutional: Negative.   HENT: Negative.    Eyes: Negative.   Respiratory: Negative.    Cardiovascular: Negative.   Gastrointestinal: Negative.   Musculoskeletal: Negative.   Skin: Negative.   Neurological: Negative.   Psychiatric/Behavioral: Negative.     Blood pressure 111/88, pulse 61, temperature 97.7 F (36.5 C), temperature source Oral, resp. rate 16, height 5\' 11"  (1.803 m), weight 87.5 kg, SpO2 100 %. Body  mass index is 26.92 kg/m.   Treatment Plan Summary: Medication management and Plan no change to medication.  Doing better.  Reviewed treatment plan with patient.  Probably ready for discharge by Monday  Tuesday, MD 09/22/2021, 12:56 PM

## 2021-09-22 NOTE — Progress Notes (Signed)
This Clinical research associate noticed that patient's scrubs had food on them, so another set of scrubs were provided to him.

## 2021-09-22 NOTE — Plan of Care (Signed)
D- Patient alert and oriented to person and situation. Patient presented in a pleasant mood on assessment stating that he slept "good" last night and had no complaints to voice to this Clinical research associate. Patient is childlike and has some developmental delay, which makes his speech incomprehensible at times. Patient denied SI, HI, AVH, and pain at this time. Patient also denied any signs/symptoms of depression/anxiety, stating that overall, he is feeling "good". Patient had no stated goals for today.  A- Support and encouragement provided.  Routine safety checks conducted every 15 minutes.  Patient informed to notify staff with problems or concerns.  R- Patient contracts for safety at this time. Patient receptive, calm, and cooperative. Patient isolates to room, except for meals. Patient remains safe at this time.  Problem: Education: Goal: Knowledge of General Education information will improve Description: Including pain rating scale, medication(s)/side effects and non-pharmacologic comfort measures Outcome: Progressing   Problem: Health Behavior/Discharge Planning: Goal: Ability to manage health-related needs will improve Outcome: Progressing   Problem: Clinical Measurements: Goal: Ability to maintain clinical measurements within normal limits will improve Outcome: Progressing Goal: Will remain free from infection Outcome: Progressing Goal: Diagnostic test results will improve Outcome: Progressing Goal: Respiratory complications will improve Outcome: Progressing Goal: Cardiovascular complication will be avoided Outcome: Progressing   Problem: Activity: Goal: Risk for activity intolerance will decrease Outcome: Progressing   Problem: Nutrition: Goal: Adequate nutrition will be maintained Outcome: Progressing   Problem: Coping: Goal: Level of anxiety will decrease Outcome: Progressing   Problem: Elimination: Goal: Will not experience complications related to bowel motility Outcome:  Progressing Goal: Will not experience complications related to urinary retention Outcome: Progressing   Problem: Pain Managment: Goal: General experience of comfort will improve Outcome: Progressing   Problem: Safety: Goal: Ability to remain free from injury will improve Outcome: Progressing   Problem: Skin Integrity: Goal: Risk for impaired skin integrity will decrease Outcome: Progressing   Problem: Education: Goal: Knowledge of [place] [facility] Education information/materials will improve Outcome: Progressing Goal: Emotional status will improve Outcome: Progressing Goal: Mental status will improve Outcome: Progressing Goal: Verbalization of understanding the information provided will improve Outcome: Progressing   Problem: Health Behavior/Discharge Planning: Goal: Identification of resources available to assist in meeting health care needs will improve Outcome: Progressing Goal: Compliance with treatment plan for underlying cause of condition will improve Outcome: Progressing   Problem: Activity: Goal: Will identify at least one activity in which they can participate Outcome: Progressing   Problem: Coping: Goal: Ability to identify and develop effective coping behavior will improve Outcome: Progressing Goal: Ability to interact with others will improve Outcome: Progressing Goal: Demonstration of participation in decision-making regarding own care will improve Outcome: Progressing Goal: Ability to use eye contact when communicating with others will improve Outcome: Progressing   Problem: Self-Concept: Goal: Will verbalize positive feelings about self Outcome: Progressing

## 2021-09-22 NOTE — [facility] Group Notes (Signed)
Pt did not attend group- left room when group was starting.

## 2021-09-22 NOTE — [facility] Group Notes (Signed)
Pt did not attend group. 

## 2021-09-23 ENCOUNTER — Other Ambulatory Visit: Payer: Self-pay

## 2021-09-23 DIAGNOSIS — F203 Undifferentiated schizophrenia: Secondary | ICD-10-CM | POA: Diagnosis not present

## 2021-09-23 MED ORDER — OLANZAPINE 10 MG PO TABS
10.0000 mg | ORAL_TABLET | Freq: Every day | ORAL | 0 refills | Status: AC
Start: 2021-09-23 — End: ?
  Filled 2021-09-23: qty 30, 30d supply, fill #0

## 2021-09-23 NOTE — [facility] Suicide Risk Assessment (Signed)
[facility] INPATIENT:  Family/Significant Other Suicide Prevention Education  Suicide Prevention Education:  Contact Attempts: guardian/sister, Shane Nash, [phone], has been identified by the patient as the family member/significant other with whom the patient will be residing, and identified as the person(s) who will aid the patient in the event of a mental health crisis.  With written consent from the patient, two attempts were made to provide suicide prevention education, prior to and/or following the patient's discharge.  We were unsuccessful in providing suicide prevention education.  A suicide education pamphlet was given to the patient to share with family/significant other.  Date and time of first attempt:09/23/21 1550 Date and time of second attempt:  Shane Nash 09/23/2021, 3:51 PM

## 2021-09-23 NOTE — Progress Notes (Signed)
Pt denies SI/HI/VH but endorses AH and verbally agrees to approach staff if these become apparent or before harming themselves/others. Rates depression 0/10. Rates anxiety 0/10. Rates pain 0/10. Pt stated that he was having things in his ears but could not exactly tell RN if they were sounds or voices. Pt was somewhat hard to understand due to missing teeth. Pt has been in his bed all day long. Scheduled medications administered to pt, per MD orders. RN provided support and encouragement to pt. Q15 min safety checks implemented and continued. Pt safe on the unit. RN will continue to monitor and intervene as needed.   09/23/21 0900  Psych Admission Type (Psych Patients Only)  Admission Status Involuntary  Psychosocial Assessment  Patient Complaints None  Eye Contact Fair  Facial Expression Blank  Affect Appropriate to circumstance  Speech Soft  Interaction Childlike;Assertive  Motor Activity Slow  Appearance/Hygiene Bizarre;Disheveled  Behavior Characteristics Cooperative;Appropriate to situation;Calm  Mood Pleasant  Thought Process  Coherency Disorganized  Content WDL  Delusions None reported or observed  Perception Hallucinations  Hallucination Auditory  Judgment Limited  Confusion None  Danger to Self  Current suicidal ideation? Denies  Danger to Others  Danger to Others None reported or observed

## 2021-09-23 NOTE — Progress Notes (Signed)
[facility] MD Progress Note  09/23/2021 12:28 PM Shane Nash  MRN:  [id number] Subjective: Follow-up 41 year old man with schizophrenia.  Patient has no new complaints.  Behavior has been calm without any dangerous behavior and he is much more lucid and appropriate in conversation.  Tolerating medicine well. Principal Problem: Schizophrenia (HCC) Diagnosis: Principal Problem:   Schizophrenia (HCC) Active Problems:   Tardive dyskinesia  Total Time spent with patient: 30 minutes  Past Psychiatric History: See previous.  History of recurrent psychosis  Past Medical History:  Past Medical History:  Diagnosis Date   Schizophrenia (HCC)    History reviewed. No pertinent surgical history. Family History: History reviewed. No pertinent family history. Family Psychiatric  History: See previous Social History:  Social History   Substance and Sexual Activity  Alcohol Use Not Currently     Social History   Substance and Sexual Activity  Drug Use No    Social History   Socioeconomic History   Marital status: Single    Spouse name: Not on file   Number of children: Not on file   Years of education: Not on file   Highest education level: Not on file  Occupational History   Not on file  Tobacco Use   Smoking status: Every Day    Packs/day: 0.25    Years: 15.00    Total pack years: 3.75    Types: Cigarettes   Smokeless tobacco: Never   Tobacco comments:    Cannot state the amount he smokes per day  Vaping Use   Vaping Use: Never used  Substance and Sexual Activity   Alcohol use: Not Currently   Drug use: No   Sexual activity: Not Currently  Other Topics Concern   Not on file  Social History Narrative   Not on file   Social Determinants of Health   Financial Resource Strain: Not on file  Food Insecurity: Not on file  Transportation Needs: Not on file  Physical Activity: Not on file  Stress: Not on file  Social Connections: Not on file   Additional Social History:                          Sleep: Fair  Appetite:  Fair  Current Medications: Current Facility-Administered Medications  Medication Dose Route Frequency Provider Last Rate Last Admin   acetaminophen (TYLENOL) tablet 650 mg  650 mg Oral Q6H PRN Vanetta Mulders, NP       alum & mag hydroxide-simeth (MAALOX/MYLANTA) 200-200-20 MG/5ML suspension 30 mL  30 mL Oral Q4H PRN Gabriel Cirri F, NP       hydrOXYzine (ATARAX) tablet 25 mg  25 mg Oral TID PRN Vanetta Mulders, NP   25 mg at 09/21/21 2126   magnesium hydroxide (MILK OF MAGNESIA) suspension 30 mL  30 mL Oral Daily PRN Gabriel Cirri F, NP       OLANZapine (ZYPREXA) tablet 10 mg  10 mg Oral QHS Lani Havlik T, MD   10 mg at 09/23/21 0022    Lab Results: No results found for this or any previous visit (from the past 48 hour(s)).  Blood Alcohol level:  Lab Results  Component Value Date   [facility] <10 09/18/2021   ETH <10 04/11/2020    Metabolic Disorder Labs: Lab Results  Component Value Date   HGBA1C 5.1 09/20/2021   MPG 99.67 09/20/2021   MPG 111.15 04/11/2020   No results found for: "PROLACTIN" Lab Results  Component Value Date   CHOL 213 (H) 09/20/2021   TRIG 174 (H) 09/20/2021   HDL 39 (L) 09/20/2021   CHOLHDL 5.5 09/20/2021   VLDL 35 09/20/2021   LDLCALC 139 (H) 09/20/2021   LDLCALC 122 (H) 04/11/2020    Physical Findings: AIMS: Facial and Oral Movements Muscles of Facial Expression: None, normal Lips and Perioral Area: None, normal Jaw: None, normal Tongue: None, normal,Extremity Movements Upper (arms, wrists, hands, fingers): None, normal Lower (legs, knees, ankles, toes): None, normal, Trunk Movements Neck, shoulders, hips: None, normal, Overall Severity Severity of abnormal movements (highest score from questions above): None, normal Incapacitation due to abnormal movements: None, normal Patient's awareness of abnormal movements (rate only patient's report): No Awareness, Dental  Status Current problems with teeth and/or dentures?: No Does patient usually wear dentures?: No  CIWA:    COWS:     Musculoskeletal: Strength & Muscle Tone: within normal limits Gait & Station: normal Patient leans: N/A  Psychiatric Specialty Exam:  Presentation  General Appearance: Bizarre  Eye Contact:Fleeting  Speech:Garbled (poor dentition)  Speech Volume:Normal  Handedness:No data recorded  Mood and Affect  Mood:Dysphoric  Affect:Blunt   Thought Process  Thought Processes:Disorganized  Descriptions of Associations:Loose  Orientation:Partial  Thought Content:Illogical  History of Schizophrenia/Schizoaffective disorder:Yes  Duration of Psychotic Symptoms:Less than six months  Hallucinations:No data recorded Ideas of Reference:No data recorded Suicidal Thoughts:No data recorded Homicidal Thoughts:No data recorded  Sensorium  Memory:Immediate Poor  Judgment:Impaired  Insight:Lacking   Executive Functions  Concentration:Poor  Attention Span:Poor  Recall:Poor  Fund of Knowledge:Poor  Language:Poor   Psychomotor Activity  Psychomotor Activity:No data recorded  Assets  Assets:Financial Resources/Insurance; Housing; Resilience   Sleep  Sleep:No data recorded   Physical Exam: Physical Exam Vitals and nursing note reviewed.  Constitutional:      Appearance: Normal appearance.  HENT:     Head: Normocephalic and atraumatic.     Mouth/Throat:     Pharynx: Oropharynx is clear.  Eyes:     Pupils: Pupils are equal, round, and reactive to light.  Cardiovascular:     Rate and Rhythm: Normal rate and regular rhythm.  Pulmonary:     Effort: Pulmonary effort is normal.     Breath sounds: Normal breath sounds.  Abdominal:     General: Abdomen is flat.     Palpations: Abdomen is soft.  Musculoskeletal:        General: Normal range of motion.  Skin:    General: Skin is warm and dry.  Neurological:     General: No focal deficit  present.     Mental Status: He is alert. Mental status is at baseline.  Psychiatric:        Attention and Perception: Attention normal.        Mood and Affect: Mood normal. Affect is blunt.        Speech: Speech normal.        Behavior: Behavior is cooperative.        Thought Content: Thought content normal.    Review of Systems  Constitutional: Negative.   HENT: Negative.    Eyes: Negative.   Respiratory: Negative.    Cardiovascular: Negative.   Gastrointestinal: Negative.   Musculoskeletal: Negative.   Skin: Negative.   Neurological: Negative.   Psychiatric/Behavioral:  Negative for depression, hallucinations, substance abuse and suicidal ideas.    Blood pressure 115/80, pulse (!) 44, temperature 98.5 F (36.9 C), temperature source Oral, resp. rate 18, height 5\' 11"  (1.803 m), weight 87.5 kg,  SpO2 98 %. Body mass index is 26.92 kg/m.   Treatment Plan Summary: Plan no change to medication.  Seems to be doing much better.  We will make tentative plans for discharge back home tomorrow  Mordecai Rasmussen, MD 09/23/2021, 12:28 PM

## 2021-09-23 NOTE — Plan of Care (Signed)
Patient isolated to his room majority of shift. Patient was approrached regarding medication but refused and would not get out of bed.  Patient did get up early in the morning and request his scheduled medication.  Patient with no c/o distress, denies SI HI AVH, and rested well throughout remainder of shift.

## 2021-09-23 NOTE — BHH Group Notes (Signed)
LCSW Wellness Group Note   09/23/2021 1:00pm  Type of Group and Topic: Psychoeducational Group:  Wellness  Participation Level:  did not attend  Description of Group  Wellness group introduces the topic and its focus on developing healthy habits across the spectrum and its relationship to a decrease in hospital admissions.  Six areas of wellness are discussed: physical, social spiritual, intellectual, occupational, and emotional.  Patients are asked to consider their current wellness habits and to identify areas of wellness where they are interested and able to focus on improvements.    Therapeutic Goals Patients will understand components of wellness and how they can positively impact overall health.  Patients will identify areas of wellness where they have developed good habits. Patients will identify areas of wellness where they would like to make improvements.    Summary of Patient Progress     Therapeutic Modalities: Cognitive Behavioral Therapy Psychoeducation    Jnyah Brazee Jon, LCSW  

## 2021-09-24 ENCOUNTER — Other Ambulatory Visit: Payer: Self-pay

## 2021-09-24 DIAGNOSIS — F201 Disorganized schizophrenia: Secondary | ICD-10-CM | POA: Diagnosis not present

## 2021-09-24 NOTE — BHH Suicide Risk Assessment (Signed)
[facility] Discharge Suicide Risk Assessment   Principal Problem: Schizophrenia, disorganized type [facility]) Discharge Diagnoses: Principal Problem:   Schizophrenia, disorganized type (HCC) Active Problems:   Tardive dyskinesia  41 yo male admitted related to family concerns of mental decline.  Client denied this and communicated with his ACT team.  His medication was restarted and he stabilized, no suicidal ideations or psychosis at this time.  Total Time spent with patient: 45 minutes  Musculoskeletal: Strength & Muscle Tone: within normal limits Gait & Station: normal Patient leans: N/A  Psychiatric Specialty Exam: Physical Exam Vitals and nursing note reviewed.  Constitutional:      Appearance: Normal appearance.  HENT:     Head: Normocephalic.     Nose: Nose normal.  Pulmonary:     Effort: Pulmonary effort is normal.  Musculoskeletal:        General: Normal range of motion.     Cervical back: Normal range of motion.  Neurological:     General: No focal deficit present.     Mental Status: He is alert and oriented to person, place, and time.  Psychiatric:        Attention and Perception: Attention and perception normal.        Mood and Affect: Mood is anxious. Affect is blunt.        Speech: Speech normal.        Behavior: Behavior normal. Behavior is cooperative.        Thought Content: Thought content normal.        Cognition and Memory: Cognition and memory normal.        Judgment: Judgment normal.     Review of Systems  Psychiatric/Behavioral:  The patient is nervous/anxious.   All other systems reviewed and are negative.   Blood pressure 116/75, pulse 71, temperature 98.2 F (36.8 C), temperature source Oral, resp. rate 18, height 5\' 11"  (1.803 m), weight 87.5 kg, SpO2 100 %.Body mass index is 26.92 kg/m.  General Appearance: Casual  Eye Contact:  Good  Speech:  Normal Rate  Volume:  Normal  Mood:  Anxious  Affect:  Blunt  Thought Process:  Coherent and  Descriptions of Associations: Intact  Orientation:  Full (Time, Place, and Person)  Thought Content:  WDL and Logical  Suicidal Thoughts:  No  Homicidal Thoughts:  No  Memory:  Immediate;   Good Recent;   Good Remote;   Good  Judgement:  Good  Insight:  Good  Psychomotor Activity:  Normal  Concentration:  Concentration: Good and Attention Span: Good  Recall:  Good  Fund of Knowledge:  Fair  Language:  Good  Akathisia:  No  Handed:  Right  AIMS (if indicated):     Assets:  Housing Leisure Time Physical Health Resilience Social Support  ADL's:  Intact  Cognition:  WNL  Sleep:        Physical Exam: Physical Exam Vitals and nursing note reviewed.  Constitutional:      Appearance: Normal appearance.  HENT:     Head: Normocephalic.     Nose: Nose normal.  Pulmonary:     Effort: Pulmonary effort is normal.  Musculoskeletal:        General: Normal range of motion.     Cervical back: Normal range of motion.  Neurological:     General: No focal deficit present.     Mental Status: He is alert and oriented to person, place, and time.  Psychiatric:        Attention and  Perception: Attention and perception normal.        Mood and Affect: Mood is anxious. Affect is blunt.        Speech: Speech normal.        Behavior: Behavior normal. Behavior is cooperative.        Thought Content: Thought content normal.        Cognition and Memory: Cognition and memory normal.        Judgment: Judgment normal.    Review of Systems  Psychiatric/Behavioral:  The patient is nervous/anxious.   All other systems reviewed and are negative.  Blood pressure 116/75, pulse 71, temperature 98.2 F (36.8 C), temperature source Oral, resp. rate 18, height 5\' 11"  (1.803 m), weight 87.5 kg, SpO2 100 %. Body mass index is 26.92 kg/m.  Mental Status Per Nursing Assessment::   On Admission:  NA  Demographic Factors:  Male  Loss Factors: NA  Historical Factors: NA  Risk Reduction Factors:    Sense of responsibility to family, Living with another person, especially a relative, Positive social support, and Positive therapeutic relationship  Continued Clinical Symptoms:  Anxiety, mild  Cognitive Features That Contribute To Risk:  None    Suicide Risk:  Minimal: No identifiable suicidal ideation.  Patients presenting with no risk factors but with morbid ruminations; may be classified as minimal risk based on the severity of the depressive symptoms   Follow-up Information     [facility] Follow up.   Why: Pt ACTT representative to provide transportation home and will resume services. Thanks! Contact information: [address] [place] [phone]                 Plan Of Care/Follow-up recommendations:  Activity:  as tolerated Diet:  heart healthy diet  Schizophrenia, disorganized type: Continue Zyprexa 10 mg daily  [phone], NP 09/24/2021, 11:20 AM

## 2021-09-24 NOTE — Discharge Summary (Signed)
Physician Discharge Summary Note  Patient:  Shane Nash is an 41 y.o., male MRN:  [id number] DOB:  1980/04/19 Patient phone:  [phone] (home)  Patient address:   [place] [address],  Total Time spent with patient: 45 minutes  Date of Admission:  09/19/2021 Date of Discharge: 09/24/2021  Reason for Admission:  Family concerns for mental decline  Principal Problem: Schizophrenia, disorganized type [facility]) Discharge Diagnoses: Principal Problem:   Schizophrenia, disorganized type ([place]) Active Problems:   Tardive dyskinesia   Past Psychiatric History: schizophrenia, disorganized  Past Medical History:  Past Medical History:  Diagnosis Date   Schizophrenia ([place])    History reviewed. No pertinent surgical history. Family History: History reviewed. No pertinent family history. Family Psychiatric  History: one Social History:  Social History   Substance and Sexual Activity  Alcohol Use Not Currently     Social History   Substance and Sexual Activity  Drug Use No    Social History   Socioeconomic History   Marital status: Single    Spouse name: Not on file   Number of children: Not on file   Years of education: Not on file   Highest education level: Not on file  Occupational History   Not on file  Tobacco Use   Smoking status: Every Day    Packs/day: 0.25    Years: 15.00    Total pack years: 3.75    Types: Cigarettes   Smokeless tobacco: Never   Tobacco comments:    Cannot state the amount he smokes per day  Vaping Use   Vaping Use: Never used  Substance and Sexual Activity   Alcohol use: Not Currently   Drug use: No   Sexual activity: Not Currently  Other Topics Concern   Not on file  Social History Narrative   Not on file   Social Determinants of Health   Financial Resource Strain: Not on file  Food Insecurity: Not on file  Transportation Needs: Not on file  Physical Activity: Not on file  Stress: Not on file   Social Connections: Not on file    Hospital Course:   On admission 09/20/21: Patient seen and chart reviewed.  41 year old man known to our service who has a history of schizophrenia.  He was brought to the hospital after family became concerned that his mental status had declined rapidly.  History obtained from the chart from the patient and from speaking with his sister on the telephone.  It is reported that over a short period of time he became more disorganized and bizarre in his behavior.  He was playing in urine and eating feces which is very abnormal for him and became disorganized worse than usual in his thinking.  Sister was not aware of any specific triggers saying that as far as she knew he was taking his medicine and had gotten a shot this month.  On interview the patient had no recollection of these symptoms and was not able to discuss symptoms very clearly although he did deny having any suicidal thoughts and denied any active hallucinations.  He said that he just needed to be here to get back on his medicine.  He has not displayed any dangerous aggressive or violent behavior.  Information obtained from the ACT team contradicts what his sister says.  They report that he is due for his Invega shot and did not get it this month.  Sister reports that he is taking olanzapine and  in grays at home.  Medications:  Zyprexa 10 mg at bedtime, hydroxyzine 25 mg TID PRN  8/25: Patient seen and attended treatment team.  Still somewhat disorganized but his behavior is calm without any dangerous or bizarre behavior and he is taking his medicine.  8/26: Patient seen and chart reviewed.  Much more calm and articulate.  No bizarre behavior.  8/27: Follow-up 41 year old man with schizophrenia.  Patient has no new complaints.  Behavior has been calm without any dangerous behavior and he is much more lucid and appropriate in conversation.  Tolerating medicine well.  8/28: Client has met maximum benefit  of hospitalization.  Denies suicidal/homicidal ideations, hallucinations, and substance abuse.  Discharge instructions provided with explanations along with Rx, appointment, and crisis numbers.  Psychiatrically stable for discharge.  Physical Findings: AIMS: Facial and Oral Movements Muscles of Facial Expression: None, normal Lips and Perioral Area: None, normal Jaw: None, normal Tongue: None, normal,Extremity Movements Upper (arms, wrists, hands, fingers): None, normal Lower (legs, knees, ankles, toes): None, normal, Trunk Movements Neck, shoulders, hips: None, normal, Overall Severity Severity of abnormal movements (highest score from questions above): None, normal Incapacitation due to abnormal movements: None, normal Patient's awareness of abnormal movements (rate only patient's report): No Awareness, Dental Status Current problems with teeth and/or dentures?: No Does patient usually wear dentures?: No  CIWA:    COWS:     Musculoskeletal: Strength & Muscle Tone: within normal limits Gait & Station: normal Patient leans: N/A  Psychiatric Specialty Exam: Physical Exam Vitals and nursing note reviewed.  Constitutional:      Appearance: Normal appearance.  HENT:     Head: Normocephalic.     Nose: Nose normal.  Pulmonary:     Effort: Pulmonary effort is normal.  Musculoskeletal:        General: Normal range of motion.     Cervical back: Normal range of motion.  Neurological:     General: No focal deficit present.     Mental Status: He is alert and oriented to person, place, and time.  Psychiatric:        Attention and Perception: Attention and perception normal.        Mood and Affect: Mood is anxious. Affect is blunt.        Speech: Speech normal.        Behavior: Behavior normal. Behavior is cooperative.        Thought Content: Thought content normal.        Cognition and Memory: Cognition and memory normal.        Judgment: Judgment normal.     Review of Systems   Psychiatric/Behavioral:  The patient is nervous/anxious.   All other systems reviewed and are negative.   Blood pressure 116/75, pulse 71, temperature 98.2 F (36.8 C), temperature source Oral, resp. rate 18, height 5' 11"  (1.803 m), weight 87.5 kg, SpO2 100 %.Body mass index is 26.92 kg/m.  General Appearance: Casual  Eye Contact:  Good  Speech:  Normal Rate  Volume:  Normal  Mood:  Anxious  Affect:  Blunt  Thought Process:  Coherent and Descriptions of Associations: Intact  Orientation:  Full (Time, Place, and Person)  Thought Content:  WDL and Logical  Suicidal Thoughts:  No  Homicidal Thoughts:  No  Memory:  Immediate;   Good Recent;   Good Remote;   Good  Judgement:  Good  Insight:  Good  Psychomotor Activity:  Normal  Concentration:  Concentration: Good and Attention Span: Good  Recall:  Roel Cluck of Knowledge:  Fair  Language:  Good  Akathisia:  No  Handed:  Right  AIMS (if indicated):     Assets:  Housing Leisure Time Physical Health Resilience Social Support  ADL's:  Intact  Cognition:  WNL  Sleep:         Physical Exam: Physical Exam Vitals and nursing note reviewed.  Constitutional:      Appearance: Normal appearance.  HENT:     Head: Normocephalic.     Nose: Nose normal.  Pulmonary:     Effort: Pulmonary effort is normal.  Musculoskeletal:        General: Normal range of motion.     Cervical back: Normal range of motion.  Neurological:     General: No focal deficit present.     Mental Status: He is alert and oriented to person, place, and time.  Psychiatric:        Attention and Perception: Attention and perception normal.        Mood and Affect: Mood is anxious. Affect is blunt.        Speech: Speech normal.        Behavior: Behavior normal. Behavior is cooperative.        Thought Content: Thought content normal.        Cognition and Memory: Cognition and memory normal.        Judgment: Judgment normal.    Review of Systems   Psychiatric/Behavioral:  The patient is nervous/anxious.   All other systems reviewed and are negative.  Blood pressure 116/75, pulse 71, temperature 98.2 F (36.8 C), temperature source Oral, resp. rate 18, height 5' 11"  (1.803 m), weight 87.5 kg, SpO2 100 %. Body mass index is 26.92 kg/m.   Social History   Tobacco Use  Smoking Status Every Day   Packs/day: 0.25   Years: 15.00   Total pack years: 3.75   Types: Cigarettes  Smokeless Tobacco Never  Tobacco Comments   Cannot state the amount he smokes per day   Tobacco Cessation:  N/A, patient does not currently use tobacco products   Blood Alcohol level:  Lab Results  Component Value Date   ETH <10 09/18/2021   ETH <10 16/10/9602    Metabolic Disorder Labs:  Lab Results  Component Value Date   HGBA1C 5.1 09/20/2021   MPG 99.67 09/20/2021   MPG 111.15 04/11/2020   No results found for: "PROLACTIN" Lab Results  Component Value Date   CHOL 213 (H) 09/20/2021   TRIG 174 (H) 09/20/2021   HDL 39 (L) 09/20/2021   CHOLHDL 5.5 09/20/2021   VLDL 35 09/20/2021   LDLCALC 139 (H) 09/20/2021   LDLCALC 122 (H) 04/11/2020    See Psychiatric Specialty Exam and Suicide Risk Assessment completed by Attending Physician prior to discharge.  Discharge destination:  Other:  group home  Is patient on multiple antipsychotic therapies at discharge:  No   Has Patient had three or more failed trials of antipsychotic monotherapy by history:  No  Recommended Plan for Multiple Antipsychotic Therapies: NA  Discharge Instructions     Diet - low sodium heart healthy   Complete by: As directed    Increase activity slowly   Complete by: As directed       Allergies as of 09/24/2021   No Known Allergies      Medication List     STOP taking these medications    divalproex 500 MG 24 hr tablet Commonly known as:  DEPAKOTE ER   paliperidone 9 MG 24 hr tablet Commonly known as: INVEGA       TAKE these medications       Indication  OLANZapine 10 MG tablet Commonly known as: ZYPREXA Take 1 tablet (10 mg total) by mouth at bedtime.  Indication: Schizophrenia        Follow-up Information     [facility] Follow up.   Why: Pt ACTT representative to provide transportation home and will resume services. Thanks! Contact information: [place] [address] [phone]                 Follow-up recommendations:  Activity:  as tolerted Diet:  heart healthy diet  Schizophrenia, disorganized type: Continue Zyprexa 10 mg daily  Comments:  Follow up with ACTT   Signed: Waylan Boga, NP 09/24/2021, 11:21 AM

## 2021-09-24 NOTE — Progress Notes (Signed)
Recreation Therapy Notes  INPATIENT RECREATION TR PLAN  Patient Details Name: Shane Nash MRN: [id number] DOB: [birth year] Today's Date: 09/24/2021  Rec Therapy Plan Is patient appropriate for Therapeutic Recreation?: Yes Treatment times per week: at least 3 Estimated Length of Stay: 5-7 days TR Treatment/Interventions: Group participation (Comment)  Discharge Criteria Pt will be discharged from therapy if:: Discharged Treatment plan/goals/alternatives discussed and agreed upon by:: Patient/family  Discharge Summary Short term goals set: Patient will engage in groups without prompting or encouragement from LRT x3 group sessions within 5 recreation therapy group sessions Short term goals met: Not met Reason goals not met: Patient did not attend any groups Therapeutic equipment acquired: N/A Reason patient discharged from therapy: Discharge from hospital Pt/family agrees with progress & goals achieved: Yes Date patient discharged from therapy: 09/24/21   Cynai Skeens 09/24/2021, 2:47 PM

## 2021-09-24 NOTE — B[facility] Suicide Risk Assessment (Addendum)
[facility] INPATIENT:  Family/Significant Other Suicide Prevention Education  Suicide Prevention Education:  Contact Attempts: Morrie Sheldon Beale/sister/guardian [phone]), has been identified by the patient as the family member/significant other with whom the patient will be residing, and identified as the person(s) who will aid the patient in the event of a mental health crisis.  With written consent from the patient, two attempts were made to provide suicide prevention education, prior to and/or following the patient's discharge.  We were unsuccessful in providing suicide prevention education.  A suicide education pamphlet was given to the patient to share with family/significant other.  Date and time of first attempt: 09/23/21 1550 Date and time of second attempt: 09/24/21 at 09:00  Glenis Smoker 09/24/2021, 9:01 AM

## 2021-09-24 NOTE — B[facility] Counselor (Signed)
CSW sat with pt while interviewed by [facility] APS caseworker [place]. Contact with without incident.   Vilma Meckel. Algis Greenhouse, MSW, LCSW, LCAS 09/24/2021 2:49 PM

## 2021-09-24 NOTE — Progress Notes (Signed)
Pt presents with flat depressed mood,affect congruent. Shane Nash states he is fine and states he feels ready to go. He remains cautious, withdrawn to his room, as evidenced by no engagement with peers, and leering far away when coming to medication room if peers are near by. He denies any SI or HI or A/V Hallucinations and he has been compliant with his medications. Eating meals in the dining hall. No group attendance thus far. Pt denies any acute concerns and able to make his needs known. Above discussed this am in treatment team with MD, NP and SW. Pt planned for discharge per MD. Pt is safe, will con't to monitor.

## 2021-09-24 NOTE — Plan of Care (Signed)
  Problem: Group Participation Goal: STG - Patient will engage in groups without prompting or encouragement from LRT x3 group sessions within 5 recreation therapy group sessions Description: STG - Patient will engage in groups without prompting or encouragement from LRT x3 group sessions within 5 recreation therapy group sessions 09/24/2021 1446 by Ernest Haber, LRT Outcome: Not Applicable 5/83/0746 0029 by Ernest Haber, LRT Outcome: Not Met (add Reason) Note: Patient did not attend any groups

## 2021-09-24 NOTE — B[facility] Counselor (Signed)
CSW attempted to contact [facility] APS worker, Jill Alexanders [phone]) to update regarding discharge. Unable to establish contact and HIPPA compliant voicemail left with contact information for follow through.   Vilma Meckel. Algis Greenhouse, MSW, LCSW, LCAS 09/24/2021 1:45 PM

## 2021-09-24 NOTE — BHH Counselor (Signed)
Adult Comprehensive Assessment  Patient ID: Shane Nash, male   DOB: [birth year], 41 y.o.   MRN: [id number]  Information Source: Information source: Patient (previous assessment from 02/24/20 encounter)  Current Stressors:  Patient states their primary concerns and needs for treatment are:: "Got off my medications." Patient states their goals for this hospitilization and ongoing recovery are:: "Trying to get out of here." Educational / Learning stressors: Pt denies Employment / Job issues: Pt denies Family Relationships: Pt denies Surveyor, quantity / Lack of resources (include bankruptcy): Pt denies Housing / Lack of housing: Pt denies Physical health (include injuries & life threatening diseases): Pt denies Social relationships: Pt denies Substance abuse: Pt denies Bereavement / Loss: Pt denies  Living/Environment/Situation:  Living Arrangements: Other relatives Living conditions (as described by patient or guardian): "It feels aight until I can get my own place." Who else lives in the home?: Pt lives with his sister and her three children. How long has patient lived in current situation?: "Been staying there for a while." What is atmosphere in current home: Comfortable, Supportive  Family History:  Marital status: Single Are you sexually active?: Yes What is your sexual orientation?: "Straight" Has your sexual activity been affected by drugs, alcohol, medication, or emotional stress?: Unable to assess Does patient have children?: No  Childhood History:  By whom was/is the patient raised?: Both parents Description of patient's relationship with caregiver when they were a child: Per previous assessment, pt repolied, "It was all right." Patient's description of current relationship with people who raised him/her: Unable to assess How were you disciplined when you got in trouble as a child/adolescent?: Previous noted that pt stated, "was not really disciplined." Does patient have  siblings?: Yes Number of Siblings: 6 Description of patient's current relationship with siblings: Patient currently lives with sister and she is the legal guardian. Did patient suffer any verbal/emotional/physical/sexual abuse as a child?: No Did patient suffer from severe childhood neglect?: No Has patient ever been sexually abused/assaulted/raped as an adolescent or adult?: No Was the patient ever a victim of a crime or a disaster?: No Witnessed domestic violence?: No 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?: No  Education:  Highest grade of school patient has completed: Per pt he only completed the ninth grade Currently a student?: No Learning disability?: Yes What learning problems does patient have?: Unable to assess  Employment/Work Situation:   Employment Situation: Unemployed Why is Patient on Disability: Mental Health How Long has Patient Been on Disability: Unknown Patient's Job has Been Impacted by Current Illness: No What is the Longest Time Patient has Held a Job?: Unknown Where was the Patient Employed at that Time?: Unknown Has Patient ever Been in the [employer]?: No  Financial Resources:   Financial resources: Medicaid Does patient have a Lawyer or guardian?: Yes Name of representative payee or guardian: Yader, Criger (sister [phone])  Alcohol/Substance Abuse:   What has been your use of drugs/alcohol within the last 12 months?: Pt denies any use If attempted suicide, did drugs/alcohol play a role in this?:  (N/A) Alcohol/Substance Abuse Treatment Hx: Denies past history If yes, describe treatment: N/A Has alcohol/substance abuse ever caused legal problems?: No  Social Support System:   Patient's Community Support System: Good Describe Community Support System: Per previous PSA, pt replied, "siblings, uncles and aunt help out. Type of faith/religion: Per previous PSA, pt replied, "Church, that's all I  know.Marland KitchenMarland KitchenChristian." How does patient's faith help to cope with current illness?: Unable  to assess  Leisure/Recreation:   Do You Have Hobbies?: No ("Not right off hand.")  Strengths/Needs:   What is the patient's perception of their strengths?: None reported Patient states they can use these personal strengths during their treatment to contribute to their recovery: None reported Patient states these barriers may affect/interfere with their treatment: None reported Patient states these barriers may affect their return to the community: None reported Other important information patient would like considered in planning for their treatment: None reported  Discharge Plan:   Currently receiving community mental health services: Yes (From Whom) ([facility]) Patient states concerns and preferences for aftercare planning are: None reported Patient states they will know when they are safe and ready for discharge when: Pt expresses desire for discharge. Does patient have access to transportation?: Yes Does patient have financial barriers related to discharge medications?: No Patient description of barriers related to discharge medications: N/A Will patient be returning to same living situation after discharge?: Yes  Summary/Recommendations:   Summary and Recommendations (to be completed by the evaluator): Patient is a 41 year old, single, male from [place], [place] [facility] He was brought to the hospital after family became concerned that his mental status had declined rapidly. It was noted that pt became more disorganized and bizarre in his behavior in a short period of time. Pt was noted to have been playing in urine and eating feces. He stated that he is here because he stopped taking his medication. However, pt shared that he would just like to return home. He denies any issues or stressors. Pt denies any substance, abuse, or neglect. He receives outpatient services from [facility] and will  continue with them upon discharge. CSW to get consent from guardian for scheduling outpatient follow up. Recommendations include: crisis stabilization, therapeutic milieu, encourage group attendance and participation, medication management for mood stabilization and development of comprehensive mental wellness plan.  Glenis Smoker. 09/24/2021

## 2021-09-24 NOTE — BHH Counselor (Signed)
CSW spoke with patient's guardian.   CSW made aware of patient discharging.  CSW made aware that ACTT team providing transportation.    Guardian asked about patient being given a purple pill, I was told that I could not cut it and he has said that it is too big for him to take".  CSW advised that she is unaware of the patient's medications but would follow up with nursing and call guardian back.  CSW called and spoke with Shane Nash.  CSW was informed that patient is not taking a purple medication at this time.  Shane Nash also advised that the guardian could speak with the patient's pharmacy about cutting the pill at the formulary.  CSW reiterated the above information to the patient's guardian.  Shane Nash, MSW, LCSW 09/24/2021 11:48 AM

## 2021-09-24 NOTE — B[facility] Group Notes (Signed)
[facility] Group Notes:  (Nursing/MHT/Case Management/Adjunct)  Date:  09/24/2021  Time:  10:27 AM  Type of Therapy:   Community Meeting  Participation Level:  Did Not Attend   Lynelle Smoke [facility] 09/24/2021, 10:27 AM

## 2021-09-24 NOTE — Progress Notes (Signed)
Order received for patients discharge. AVS printed and crisis services reviewed. Pt verbalized understanding of discharge care and patient escorted from unit with staff to care of ACTT Team. All belongings returned. NO signs of acute decompensation. No SI /HI/AV Hallucinations.

## 2021-09-24 NOTE — Group Note (Signed)
[facility] LCSW Group Therapy Note    Group Date: 09/24/2021 Start Time: 1300 End Time: 1400  Type of Therapy and Topic:  Group Therapy:  Overcoming Obstacles  Participation Level:  [facility] PARTICIPATION LEVEL: Did Not Attend   Description of Group:   In this group patients will be encouraged to explore what they see as obstacles to their own wellness and recovery. They will be guided to discuss their thoughts, feelings, and behaviors related to these obstacles. The group will process together ways to cope with barriers, with attention given to specific choices patients can make. Each patient will be challenged to identify changes they are motivated to make in order to overcome their obstacles. This group will be process-oriented, with patients participating in exploration of their own experiences as well as giving and receiving support and challenge from other group members.  Therapeutic Goals: 1. Patient will identify personal and current obstacles as they relate to admission. 2. Patient will identify barriers that currently interfere with their wellness or overcoming obstacles.  3. Patient will identify feelings, thought process and behaviors related to these barriers. 4. Patient will identify two changes they are willing to make to overcome these obstacles:    Summary of Patient Progress Group not held due to acuity.   Therapeutic Modalities:   Cognitive Behavioral Therapy Solution Focused Therapy Motivational Interviewing Relapse Prevention Therapy   Danaisha Celli R Yona Kosek, LCSW 

## 2021-09-24 NOTE — Progress Notes (Addendum)
Recreation Therapy Notes    Date: 09/24/2021   Time: 10:50 am     Location: Craft room    Behavioral response: N/A   Intervention Topic: Relaxation    Discussion/Intervention: Patient refused to attend group.    Clinical Observations/Feedback:  Patient refused to attend group.    Meeghan Skipper LRT/CTRS        Shane Nash 09/24/2021 12:40 PM 

## 2021-09-24 NOTE — Progress Notes (Signed)
  [facility] Case Management Discharge Plan :  Will you be returning to the same living situation after discharge:  Yes,  pt to return home. At discharge, do you have transportation home?: Yes,  ACTT representative to provide transportation. Do you have the ability to pay for your medications: Yes,  [employer] Medicaid.  Release of information consent forms completed and in the chart;  Patient's signature needed at discharge.  Patient to Follow up at:  Follow-up Information     [facility] Follow up.   Why: Pt ACTT representative to provide transportation home and will resume services at that time. Thanks! Contact information: [address] [place] [address] [phone]                 Next level of care provider has access to [facility] Link:no  Safety Planning and Suicide Prevention discussed: Yes,  SPE completed with guardian.      Has patient been referred to the Quitline?: Patient refused referral  Patient has been referred for addiction treatment: N/A  Glenis Smoker, LCSW 09/24/2021, 1:06 PM

## 2021-09-24 NOTE — Progress Notes (Signed)
   09/24/21 0600  Psych Admission Type (Psych Patients Only)  Admission Status Involuntary  Psychosocial Assessment  Patient Complaints None  Eye Contact Fair  Facial Expression Blank  Affect Appropriate to circumstance  Speech Soft  Interaction Childlike;Assertive  Motor Activity Slow  Appearance/Hygiene Bizarre;Disheveled  Behavior Characteristics Cooperative;Appropriate to situation  Mood Pleasant  Thought Process  Coherency Disorganized  Content WDL  Delusions None reported or observed  Perception Hallucinations  Hallucination Auditory  Judgment Limited  Confusion None  Danger to Self  Current suicidal ideation? Denies  Danger to Others  Danger to Others None reported or observed

## 2023-04-25 ENCOUNTER — Other Ambulatory Visit: Payer: Self-pay

## 2023-04-25 ENCOUNTER — Emergency Department: Payer: MEDICAID

## 2023-04-25 ENCOUNTER — Emergency Department
Admission: EM | Admit: 2023-04-25 | Discharge: 2023-04-25 | Disposition: A | Discharge status: Home / Self Care | Payer: MEDICAID | Attending: Emergency Medicine | Admitting: Emergency Medicine

## 2023-04-25 DIAGNOSIS — Y92009 Unspecified place in unspecified non-institutional (private) residence as the place of occurrence of the external cause: Secondary | ICD-10-CM | POA: Insufficient documentation

## 2023-04-25 DIAGNOSIS — W010XXA Fall on same level from slipping, tripping and stumbling without subsequent striking against object, initial encounter: Secondary | ICD-10-CM | POA: Diagnosis not present

## 2023-04-25 DIAGNOSIS — S51811A Laceration without foreign body of right forearm, initial encounter: Secondary | ICD-10-CM | POA: Insufficient documentation

## 2023-04-25 NOTE — Discharge Instructions (Signed)
 You were seen in the ER for evaluation after your fall.  Your x-Shane Nash was reassuring.  I have included more information about laceration care in your paperwork.  Return to the ER for new or worsening symptoms.

## 2023-04-25 NOTE — ED Provider Notes (Signed)
 [facility] Provider Note    Event Date/Time   First MD Initiated Contact with Patient 04/25/23 1911     (approximate)   History   Fall and Laceration   HPI  Shane Nash is a 43 year old male presenting to the ER for evaluation after a fall.  Patient reports was in a home that was being remodeled when he bent down and slipped causing him to fall onto the ground.  Did sustain a cut to his right wrist and forearm.  Denies hitting his head.  Denies injury to other areas.  Not on anticoagulation.  Thinks that his tetanus is up-to-date, but is unsure. Declined update.  Of note, patient does have a documented history of schizoaffective disorder.  Per triage note, patient has been off of his medications.  Patient denies SI, HI, does not appear to be responding to internal stimuli, has clear throughout process on my exam here.  Patient does not meet criteria for involuntary commitment on my evaluation.     Physical Exam   Triage Vital Signs: ED Triage Vitals  Encounter Vitals Group     BP 04/25/23 1915 (!) 153/99     Systolic BP Percentile --      Diastolic BP Percentile --      Pulse Rate 04/25/23 1915 97     Resp --      Temp 04/25/23 1919 98 F (36.7 C)     Temp Source 04/25/23 1919 Oral     SpO2 04/25/23 1915 100 %     Weight 04/25/23 1916 186 lb (84.4 kg)     Height 04/25/23 1916 5\' 11"  (1.803 m)     Head Circumference --      Peak Flow --      Pain Score --      Pain Loc --      Pain Education --      Exclude from Growth Chart --     Most recent vital signs: Vitals:   04/25/23 1915 04/25/23 1919  BP: (!) 153/99   Pulse: 97   Temp:  98 F (36.7 C)  SpO2: 100%      General: Awake, interactive  CV:  Regular rate, good peripheral perfusion.  Resp:  Unlabored respirations.  Abd:  Nondistended.  Neuro:  Symmetric facial movement, fluid speech MSK:  There is a dressing over the right forearm with dried blood underneath.  There are 2  lacerations, approximately 3 cm in length with associated gaping over the forearm and along the wrist.  Bleeding is controlled.  No visible foreign bodies.  2+ DP pulses bilaterally, intact sensation and movement.   ED Results / Procedures / Treatments   Labs (all labs ordered are listed, but only abnormal results are displayed) Labs Reviewed - No data to display   EKG EKG independently reviewed interpreted by myself (ER attending) demonstrates:    RADIOLOGY Imaging independently reviewed and interpreted by myself demonstrates:  X-Shane Nash demonstrate soft tissue edema without visible foreign body or fracture  PROCEDURES:  Critical Care performed: No  .Laceration Repair  Date/Time: 04/25/2023 9:07 PM  Performed by: [name], MD Authorized by: [name], MD   Consent:    Consent obtained:  Verbal   Consent given by:  Patient   Risks, benefits, and alternatives were discussed: yes   Laceration details:    Location:  Shoulder/arm   Shoulder/arm location:  R lower arm   Length (cm):  3 (x2) Treatment:  Irrigation solution:  Tap water Skin repair:    Repair method:  Steri-Strips   Number of Steri-Strips:  10 Repair type:    Repair type:  Simple Post-procedure details:    Dressing:  Non-adherent dressing   Procedure completion:  Tolerated    MEDICATIONS ORDERED IN ED: Medications - No data to display   IMPRESSION / MDM / ASSESSMENT AND PLAN / ED COURSE  I reviewed the triage vital signs and the nursing notes.  Differential diagnosis includes, but is not limited to, tissue injury, fracture, no evidence of neurovascular compromise  Patient's presentation is most consistent with acute complicated illness / injury requiring diagnostic workup.  43 year old male presenting after a fall with forearm lacerations.  Stable vitals on presentation.  Noted to have lacerations on exam.  [name] ordered from triage without foreign body or fracture.  Does have gaping of his  lacerations, I did recommend washout and suture repair.  Patient preferred to wash out his wounds in the sink with tap water.  I repeatedly discussed options for repair and patient declined sutures, but was amenable to repair with Steri-Strips.  Did discuss increased scar and worsened healing with this option and patient expressed understanding.  His wounds were closed with multiple Steri-Strips with adequate approximation.  He declines a tetanus update.  Strict return precautions provided.  Patient discharged stable condition.      FINAL CLINICAL IMPRESSION(S) / ED DIAGNOSES   Final diagnoses:  Laceration of right forearm, initial encounter     Rx / DC Orders   ED Discharge Orders     None        Note:  This document was prepared using Dragon voice recognition software and may include unintentional dictation errors.   [name], MD 04/25/23 [phone]

## 2023-04-25 NOTE — ED Triage Notes (Addendum)
 Patient coming from home with sister who is remodeling her home, and patient slipped and fell through the floor and through the window per EMS. Laceration to right wrist and forearm. Of note, patient has schizoaffective disorder and has not been medicated for some time. Patient denies blood thinners. Last tetanus unknown.

## 2023-04-25 NOTE — ED Notes (Signed)
 Attempted to calll [employer] Legal guardian x 4, no answer or voicemail service available
